# Patient Record
Sex: Female | Born: 1960
Health system: Southern US, Community
[De-identification: ages and names within clinical notes are randomized; demographics above are authoritative.]

## PROBLEM LIST (undated history)

## (undated) DIAGNOSIS — G4700 Insomnia, unspecified: Secondary | ICD-10-CM

## (undated) DIAGNOSIS — D649 Anemia, unspecified: Secondary | ICD-10-CM

## (undated) DIAGNOSIS — I10 Essential (primary) hypertension: Secondary | ICD-10-CM

## (undated) DIAGNOSIS — A599 Trichomoniasis, unspecified: Secondary | ICD-10-CM

## (undated) DIAGNOSIS — K2970 Gastritis, unspecified, without bleeding: Secondary | ICD-10-CM

## (undated) DIAGNOSIS — E785 Hyperlipidemia, unspecified: Secondary | ICD-10-CM

## (undated) DIAGNOSIS — K922 Gastrointestinal hemorrhage, unspecified: Secondary | ICD-10-CM

## (undated) DIAGNOSIS — Z21 Asymptomatic human immunodeficiency virus [HIV] infection status: Secondary | ICD-10-CM

## (undated) DIAGNOSIS — B20 Human immunodeficiency virus [HIV] disease: Secondary | ICD-10-CM

## (undated) DIAGNOSIS — K269 Duodenal ulcer, unspecified as acute or chronic, without hemorrhage or perforation: Secondary | ICD-10-CM

## (undated) DIAGNOSIS — I4891 Unspecified atrial fibrillation: Secondary | ICD-10-CM

## (undated) DIAGNOSIS — T7840XA Allergy, unspecified, initial encounter: Secondary | ICD-10-CM

## (undated) DIAGNOSIS — B379 Candidiasis, unspecified: Secondary | ICD-10-CM

## (undated) HISTORY — DX: Allergy, unspecified, initial encounter: T78.40XA

## (undated) HISTORY — DX: Unspecified atrial fibrillation: I48.91

## (undated) HISTORY — DX: Anemia, unspecified: D64.9

## (undated) HISTORY — DX: Asymptomatic human immunodeficiency virus (hiv) infection status: Z21

## (undated) HISTORY — DX: Duodenal ulcer, unspecified as acute or chronic, without hemorrhage or perforation: K26.9

## (undated) HISTORY — DX: Gastritis, unspecified, without bleeding: K29.70

## (undated) HISTORY — DX: Essential (primary) hypertension: I10

## (undated) HISTORY — DX: Hyperlipidemia, unspecified: E78.5

## (undated) HISTORY — DX: Insomnia, unspecified: G47.00

## (undated) HISTORY — DX: Human immunodeficiency virus (HIV) disease: B20

## (undated) HISTORY — DX: Candidiasis, unspecified: B37.9

## (undated) HISTORY — PX: OTHER SURGICAL HISTORY: SHX169

## (undated) HISTORY — DX: Trichomoniasis, unspecified: A59.9

## (undated) HISTORY — DX: Gastrointestinal hemorrhage, unspecified: K92.2

## (undated) HISTORY — PX: TUBAL LIGATION: SHX77

---

## 1998-04-19 ENCOUNTER — Emergency Department (HOSPITAL_COMMUNITY): Admission: EM | Admit: 1998-04-19 | Discharge: 1998-04-19 | Payer: Self-pay | Admitting: Emergency Medicine

## 2000-10-25 ENCOUNTER — Inpatient Hospital Stay: Admission: AD | Admit: 2000-10-25 | Discharge: 2000-10-25 | Payer: Self-pay | Admitting: Obstetrics

## 2002-05-21 ENCOUNTER — Encounter: Payer: Self-pay | Admitting: Orthopedic Surgery

## 2002-05-21 ENCOUNTER — Emergency Department (HOSPITAL_COMMUNITY): Admission: EM | Admit: 2002-05-21 | Discharge: 2002-05-21 | Payer: Self-pay | Admitting: Emergency Medicine

## 2002-05-21 ENCOUNTER — Encounter: Payer: Self-pay | Admitting: Emergency Medicine

## 2002-05-29 ENCOUNTER — Ambulatory Visit (HOSPITAL_BASED_OUTPATIENT_CLINIC_OR_DEPARTMENT_OTHER): Admission: RE | Admit: 2002-05-29 | Discharge: 2002-05-29 | Payer: Self-pay | Admitting: Orthopedic Surgery

## 2002-06-27 ENCOUNTER — Inpatient Hospital Stay (HOSPITAL_COMMUNITY): Admission: AD | Admit: 2002-06-27 | Discharge: 2002-06-27 | Payer: Self-pay | Admitting: *Deleted

## 2002-08-21 ENCOUNTER — Encounter: Admission: RE | Admit: 2002-08-21 | Discharge: 2002-11-19 | Payer: Self-pay | Admitting: Orthopedic Surgery

## 2003-11-06 ENCOUNTER — Ambulatory Visit (HOSPITAL_COMMUNITY): Admission: RE | Admit: 2003-11-06 | Discharge: 2003-11-06 | Payer: Self-pay | Admitting: Family Medicine

## 2003-11-16 ENCOUNTER — Encounter: Admission: RE | Admit: 2003-11-16 | Discharge: 2003-11-16 | Payer: Self-pay | Admitting: Family Medicine

## 2003-12-13 ENCOUNTER — Ambulatory Visit: Payer: Self-pay | Admitting: *Deleted

## 2004-06-19 ENCOUNTER — Ambulatory Visit: Payer: Self-pay | Admitting: Internal Medicine

## 2004-08-15 ENCOUNTER — Ambulatory Visit: Payer: Self-pay | Admitting: Internal Medicine

## 2004-10-01 ENCOUNTER — Ambulatory Visit: Payer: Self-pay | Admitting: Internal Medicine

## 2005-03-18 ENCOUNTER — Ambulatory Visit: Payer: Self-pay | Admitting: Internal Medicine

## 2005-04-06 ENCOUNTER — Ambulatory Visit: Payer: Self-pay | Admitting: Family Medicine

## 2005-06-11 ENCOUNTER — Ambulatory Visit: Payer: Self-pay | Admitting: Family Medicine

## 2005-08-11 ENCOUNTER — Emergency Department (HOSPITAL_COMMUNITY): Admission: EM | Admit: 2005-08-11 | Discharge: 2005-08-11 | Payer: Self-pay | Admitting: Emergency Medicine

## 2005-09-03 ENCOUNTER — Emergency Department (HOSPITAL_COMMUNITY): Admission: EM | Admit: 2005-09-03 | Discharge: 2005-09-03 | Payer: Self-pay | Admitting: Emergency Medicine

## 2005-12-02 ENCOUNTER — Inpatient Hospital Stay (HOSPITAL_COMMUNITY): Admission: AD | Admit: 2005-12-02 | Discharge: 2005-12-02 | Payer: Self-pay | Admitting: Family Medicine

## 2005-12-14 ENCOUNTER — Ambulatory Visit: Payer: Self-pay | Admitting: Family Medicine

## 2006-02-02 ENCOUNTER — Ambulatory Visit: Payer: Self-pay | Admitting: Family Medicine

## 2006-02-15 ENCOUNTER — Emergency Department (HOSPITAL_COMMUNITY): Admission: EM | Admit: 2006-02-15 | Discharge: 2006-02-15 | Payer: Self-pay | Admitting: Emergency Medicine

## 2006-02-16 ENCOUNTER — Ambulatory Visit: Payer: Self-pay | Admitting: Family Medicine

## 2006-04-27 ENCOUNTER — Ambulatory Visit: Payer: Self-pay | Admitting: Family Medicine

## 2006-06-02 ENCOUNTER — Encounter (INDEPENDENT_AMBULATORY_CARE_PROVIDER_SITE_OTHER): Payer: Self-pay | Admitting: Family Medicine

## 2006-06-02 ENCOUNTER — Ambulatory Visit: Payer: Self-pay | Admitting: Family Medicine

## 2006-07-26 ENCOUNTER — Ambulatory Visit: Payer: Self-pay | Admitting: Family Medicine

## 2006-11-24 ENCOUNTER — Encounter (INDEPENDENT_AMBULATORY_CARE_PROVIDER_SITE_OTHER): Payer: Self-pay | Admitting: *Deleted

## 2006-12-23 ENCOUNTER — Ambulatory Visit: Payer: Self-pay | Admitting: Internal Medicine

## 2006-12-24 ENCOUNTER — Ambulatory Visit (HOSPITAL_COMMUNITY): Admission: RE | Admit: 2006-12-24 | Discharge: 2006-12-24 | Payer: Self-pay | Admitting: Internal Medicine

## 2007-01-18 ENCOUNTER — Ambulatory Visit: Payer: Self-pay | Admitting: Family Medicine

## 2007-05-23 ENCOUNTER — Ambulatory Visit: Payer: Self-pay | Admitting: Family Medicine

## 2007-05-23 ENCOUNTER — Encounter (INDEPENDENT_AMBULATORY_CARE_PROVIDER_SITE_OTHER): Payer: Self-pay | Admitting: Family Medicine

## 2007-05-23 LAB — CONVERTED CEMR LAB
ALT: 19 units/L (ref 0–35)
AST: 23 units/L (ref 0–37)
Albumin: 3.7 g/dL (ref 3.5–5.2)
Alkaline Phosphatase: 85 units/L (ref 39–117)
BUN: 9 mg/dL (ref 6–23)
Basophils Absolute: 0 10*3/uL (ref 0.0–0.1)
Basophils Relative: 1 % (ref 0–1)
CO2: 22 meq/L (ref 19–32)
Calcium: 9 mg/dL (ref 8.4–10.5)
Chlamydia, Swab/Urine, PCR: NEGATIVE
Chloride: 107 meq/L (ref 96–112)
Cholesterol: 149 mg/dL (ref 0–200)
Creatinine, Ser: 0.86 mg/dL (ref 0.40–1.20)
Eosinophils Absolute: 0 10*3/uL (ref 0.0–0.7)
Eosinophils Relative: 0 % (ref 0–5)
GC Probe Amp, Urine: NEGATIVE
Glucose, Bld: 90 mg/dL (ref 70–99)
HCT: 38.1 % (ref 36.0–46.0)
HDL: 43 mg/dL (ref >39)
Helicobacter Pylori Antibody-IgG: 5.2 — ABNORMAL HIGH
Hemoglobin: 12.3 g/dL (ref 12.0–15.0)
LDL Cholesterol: 81 mg/dL (ref 0–99)
Lymphocytes Relative: 44 % (ref 12–46)
Lymphs Abs: 1.9 10*3/uL (ref 0.7–4.0)
MCHC: 32.3 g/dL (ref 30.0–36.0)
MCV: 94.3 fL (ref 78.0–100.0)
Monocytes Absolute: 0.4 10*3/uL (ref 0.1–1.0)
Monocytes Relative: 10 % (ref 3–12)
Neutro Abs: 2 10*3/uL (ref 1.7–7.7)
Neutrophils Relative %: 46 % (ref 43–77)
Platelets: 226 10*3/uL (ref 150–400)
Potassium: 4 meq/L (ref 3.5–5.3)
RBC: 4.04 M/uL (ref 3.87–5.11)
RDW: 14.6 % (ref 11.5–15.5)
Sodium: 139 meq/L (ref 135–145)
TSH: 0.521 microintl units/mL (ref 0.350–5.50)
Total Bilirubin: 0.3 mg/dL (ref 0.3–1.2)
Total CHOL/HDL Ratio: 3.5
Total Protein: 7.5 g/dL (ref 6.0–8.3)
Triglycerides: 124 mg/dL (ref <150)
VLDL: 25 mg/dL (ref 0–40)
WBC: 4.4 10*3/uL (ref 4.0–10.5)

## 2007-06-02 ENCOUNTER — Ambulatory Visit: Payer: Self-pay | Admitting: Internal Medicine

## 2007-06-03 ENCOUNTER — Encounter: Payer: Self-pay | Admitting: Internal Medicine

## 2007-06-03 LAB — CONVERTED CEMR LAB
HCV Ab: NEGATIVE
HIV 1 RNA Quant: 338000 copies/mL — ABNORMAL HIGH (ref <50)
HIV-1 RNA Quant, Log: 5.53 — ABNORMAL HIGH (ref <1.70)
HIV-1 antibody: POSITIVE — AB
HIV-2 Ab: UNDETERMINED — AB
HIV: REACTIVE
Hep B Core Total Ab: NEGATIVE
Hep B S Ab: NEGATIVE
Hepatitis B Surface Ag: NEGATIVE

## 2007-06-09 ENCOUNTER — Ambulatory Visit: Payer: Self-pay | Admitting: Internal Medicine

## 2007-06-10 ENCOUNTER — Ambulatory Visit: Payer: Self-pay | Admitting: Internal Medicine

## 2007-06-13 ENCOUNTER — Ambulatory Visit: Payer: Self-pay | Admitting: Family Medicine

## 2007-06-16 ENCOUNTER — Ambulatory Visit: Payer: Self-pay | Admitting: Internal Medicine

## 2007-06-16 ENCOUNTER — Encounter: Payer: Self-pay | Admitting: Internal Medicine

## 2007-06-16 LAB — CONVERTED CEMR LAB
Absolute CD4: 462 #/uL (ref 381–1469)
CD4 T Helper %: 40 % (ref 32–62)
Total Lymphocytes %: 35 % (ref 12–46)
Total lymphocyte count: 1155 cells/mcL (ref 700–3300)
WBC, lymph enumeration: 3.3 10*3/uL — ABNORMAL LOW (ref 4.0–10.5)

## 2007-06-30 ENCOUNTER — Ambulatory Visit: Payer: Self-pay | Admitting: Internal Medicine

## 2007-08-10 ENCOUNTER — Emergency Department (HOSPITAL_COMMUNITY): Admission: EM | Admit: 2007-08-10 | Discharge: 2007-08-10 | Payer: Self-pay | Admitting: Emergency Medicine

## 2007-09-01 ENCOUNTER — Ambulatory Visit: Payer: Self-pay | Admitting: Internal Medicine

## 2007-09-02 ENCOUNTER — Encounter: Payer: Self-pay | Admitting: Internal Medicine

## 2007-09-02 LAB — CONVERTED CEMR LAB
ALT: 73 units/L — ABNORMAL HIGH (ref 0–35)
AST: 54 units/L — ABNORMAL HIGH (ref 0–37)
Absolute CD4: 326 #/uL — ABNORMAL LOW (ref 381–1469)
Albumin: 3.9 g/dL (ref 3.5–5.2)
Alkaline Phosphatase: 108 units/L (ref 39–117)
BUN: 14 mg/dL (ref 6–23)
Basophils Absolute: 0 10*3/uL (ref 0.0–0.1)
Basophils Relative: 0 % (ref 0–1)
CD4 T Helper %: 31 % — ABNORMAL LOW (ref 32–62)
CO2: 24 meq/L (ref 19–32)
Calcium: 8.9 mg/dL (ref 8.4–10.5)
Chloride: 104 meq/L (ref 96–112)
Creatinine, Ser: 1.04 mg/dL (ref 0.40–1.20)
Eosinophils Absolute: 0 10*3/uL (ref 0.0–0.7)
Eosinophils Relative: 1 % (ref 0–5)
Glucose, Bld: 58 mg/dL — ABNORMAL LOW (ref 70–99)
HCT: 44.2 % (ref 36.0–46.0)
HIV 1 RNA Quant: 1300000 copies/mL — ABNORMAL HIGH (ref <50)
HIV-1 RNA Quant, Log: 6.11 — ABNORMAL HIGH (ref <1.70)
Hemoglobin: 14.2 g/dL (ref 12.0–15.0)
Lymphocytes Relative: 30 % (ref 12–46)
Lymphs Abs: 1.1 10*3/uL (ref 0.7–4.0)
MCHC: 32.1 g/dL (ref 30.0–36.0)
MCV: 89.8 fL (ref 78.0–100.0)
Monocytes Absolute: 0.1 10*3/uL (ref 0.1–1.0)
Monocytes Relative: 4 % (ref 3–12)
Neutro Abs: 2.2 10*3/uL (ref 1.7–7.7)
Neutrophils Relative %: 64 % (ref 43–77)
Platelets: 222 10*3/uL (ref 150–400)
Potassium: 4.9 meq/L (ref 3.5–5.3)
RBC: 4.92 M/uL (ref 3.87–5.11)
RDW: 14.1 % (ref 11.5–15.5)
Sodium: 139 meq/L (ref 135–145)
Total Bilirubin: 0.3 mg/dL (ref 0.3–1.2)
Total Lymphocytes %: 30 % (ref 12–46)
Total Protein: 8.5 g/dL — ABNORMAL HIGH (ref 6.0–8.3)
Total lymphocyte count: 1050 cells/mcL (ref 700–3300)
WBC, lymph enumeration: 3.5 10*3/uL — ABNORMAL LOW (ref 4.0–10.5)
WBC: 3.5 10*3/uL — ABNORMAL LOW (ref 4.0–10.5)

## 2007-09-15 ENCOUNTER — Ambulatory Visit: Payer: Self-pay | Admitting: Internal Medicine

## 2007-10-20 ENCOUNTER — Ambulatory Visit: Payer: Self-pay | Admitting: Internal Medicine

## 2007-11-21 ENCOUNTER — Ambulatory Visit: Payer: Self-pay | Admitting: Internal Medicine

## 2007-11-21 LAB — CONVERTED CEMR LAB
ALT: 125 units/L — ABNORMAL HIGH (ref 0–35)
AST: 70 units/L — ABNORMAL HIGH (ref 0–37)
Absolute CD4: 430 #/uL (ref 381–1469)
Albumin: 3.9 g/dL (ref 3.5–5.2)
Alkaline Phosphatase: 210 units/L — ABNORMAL HIGH (ref 39–117)
BUN: 12 mg/dL (ref 6–23)
Basophils Absolute: 0 10*3/uL (ref 0.0–0.1)
Basophils Relative: 0 % (ref 0–1)
CD4 T Helper %: 39 % (ref 32–62)
CO2: 25 meq/L (ref 19–32)
Calcium: 9.4 mg/dL (ref 8.4–10.5)
Chloride: 102 meq/L (ref 96–112)
Creatinine, Ser: 0.82 mg/dL (ref 0.40–1.20)
Eosinophils Absolute: 0.1 10*3/uL (ref 0.0–0.7)
Eosinophils Relative: 2 % (ref 0–5)
Glucose, Bld: 95 mg/dL (ref 70–99)
HCT: 34.2 % — ABNORMAL LOW (ref 36.0–46.0)
HIV 1 RNA Quant: 501 copies/mL — ABNORMAL HIGH (ref <50)
HIV-1 RNA Quant, Log: 2.7 — ABNORMAL HIGH (ref <1.70)
Hemoglobin: 10.9 g/dL — ABNORMAL LOW (ref 12.0–15.0)
Lymphocytes Relative: 29 % (ref 12–46)
Lymphs Abs: 1.1 10*3/uL (ref 0.7–4.0)
MCHC: 31.9 g/dL (ref 30.0–36.0)
MCV: 91.9 fL (ref 78.0–100.0)
Monocytes Absolute: 0.4 10*3/uL (ref 0.1–1.0)
Monocytes Relative: 11 % (ref 3–12)
Neutro Abs: 2.2 10*3/uL (ref 1.7–7.7)
Neutrophils Relative %: 58 % (ref 43–77)
Platelets: 240 10*3/uL (ref 150–400)
Potassium: 4.1 meq/L (ref 3.5–5.3)
RBC: 3.72 M/uL — ABNORMAL LOW (ref 3.87–5.11)
RDW: 15.9 % — ABNORMAL HIGH (ref 11.5–15.5)
Sodium: 135 meq/L (ref 135–145)
Total Bilirubin: 0.3 mg/dL (ref 0.3–1.2)
Total Lymphocytes %: 29 % (ref 12–46)
Total Protein: 8.8 g/dL — ABNORMAL HIGH (ref 6.0–8.3)
Total lymphocyte count: 1102 cells/mcL (ref 700–3300)
WBC, lymph enumeration: 3.8 10*3/uL — ABNORMAL LOW (ref 4.0–10.5)
WBC: 3.8 10*3/uL — ABNORMAL LOW (ref 4.0–10.5)

## 2007-11-30 ENCOUNTER — Ambulatory Visit: Payer: Self-pay | Admitting: Internal Medicine

## 2007-12-05 ENCOUNTER — Ambulatory Visit: Payer: Self-pay | Admitting: Internal Medicine

## 2007-12-08 ENCOUNTER — Ambulatory Visit (HOSPITAL_COMMUNITY): Admission: RE | Admit: 2007-12-08 | Discharge: 2007-12-08 | Payer: Self-pay | Admitting: Internal Medicine

## 2008-01-04 ENCOUNTER — Ambulatory Visit: Payer: Self-pay | Admitting: Internal Medicine

## 2008-01-04 ENCOUNTER — Encounter (INDEPENDENT_AMBULATORY_CARE_PROVIDER_SITE_OTHER): Payer: Self-pay | Admitting: Family Medicine

## 2008-01-04 LAB — CONVERTED CEMR LAB
Chlamydia, DNA Probe: NEGATIVE
GC Probe Amp, Genital: NEGATIVE

## 2008-01-13 ENCOUNTER — Encounter: Payer: Self-pay | Admitting: Internal Medicine

## 2008-01-13 LAB — CONVERTED CEMR LAB

## 2008-01-19 DIAGNOSIS — B20 Human immunodeficiency virus [HIV] disease: Secondary | ICD-10-CM | POA: Insufficient documentation

## 2008-01-19 DIAGNOSIS — G609 Hereditary and idiopathic neuropathy, unspecified: Secondary | ICD-10-CM | POA: Insufficient documentation

## 2008-01-19 DIAGNOSIS — I10 Essential (primary) hypertension: Secondary | ICD-10-CM | POA: Insufficient documentation

## 2008-01-21 ENCOUNTER — Emergency Department (HOSPITAL_COMMUNITY): Admission: EM | Admit: 2008-01-21 | Discharge: 2008-01-21 | Payer: Self-pay | Admitting: Emergency Medicine

## 2008-01-26 ENCOUNTER — Ambulatory Visit: Payer: Self-pay | Admitting: Internal Medicine

## 2008-01-31 ENCOUNTER — Ambulatory Visit: Payer: Self-pay | Admitting: Internal Medicine

## 2008-03-05 ENCOUNTER — Ambulatory Visit: Payer: Self-pay | Admitting: Internal Medicine

## 2008-03-05 LAB — CONVERTED CEMR LAB
ALT: 33 units/L (ref 0–35)
AST: 31 units/L (ref 0–37)
Absolute CD4: 562 #/uL (ref 381–1469)
Albumin: 3.8 g/dL (ref 3.5–5.2)
Alkaline Phosphatase: 158 units/L — ABNORMAL HIGH (ref 39–117)
BUN: 10 mg/dL (ref 6–23)
Basophils Absolute: 0 10*3/uL (ref 0.0–0.1)
Basophils Relative: 0 % (ref 0–1)
CD4 T Helper %: 46 % (ref 32–62)
CO2: 26 meq/L (ref 19–32)
Calcium: 9.2 mg/dL (ref 8.4–10.5)
Chloride: 102 meq/L (ref 96–112)
Creatinine, Ser: 0.89 mg/dL (ref 0.40–1.20)
Eosinophils Absolute: 0.1 10*3/uL (ref 0.0–0.7)
Eosinophils Relative: 2 % (ref 0–5)
Glucose, Bld: 86 mg/dL (ref 70–99)
HCT: 36.8 % (ref 36.0–46.0)
HIV 1 RNA Quant: <48 copies/mL (ref <48)
HIV-1 RNA Quant, Log: <1.68 (ref <1.68)
Hemoglobin: 11.9 g/dL — ABNORMAL LOW (ref 12.0–15.0)
Lymphocytes Relative: 33 % (ref 12–46)
Lymphs Abs: 1.2 10*3/uL (ref 0.7–4.0)
MCHC: 32.3 g/dL (ref 30.0–36.0)
MCV: 94.4 fL (ref 78.0–100.0)
Monocytes Absolute: 0.2 10*3/uL (ref 0.1–1.0)
Monocytes Relative: 6 % (ref 3–12)
Neutro Abs: 2.2 10*3/uL (ref 1.7–7.7)
Neutrophils Relative %: 59 % (ref 43–77)
Platelets: 322 10*3/uL (ref 150–400)
Potassium: 4.2 meq/L (ref 3.5–5.3)
RBC: 3.9 M/uL (ref 3.87–5.11)
RDW: 13.1 % (ref 11.5–15.5)
Sodium: 139 meq/L (ref 135–145)
Total Bilirubin: 0.3 mg/dL (ref 0.3–1.2)
Total Lymphocytes %: 33 % (ref 12–46)
Total Protein: 8.5 g/dL — ABNORMAL HIGH (ref 6.0–8.3)
Total lymphocyte count: 1221 cells/mcL (ref 700–3300)
WBC, lymph enumeration: 3.7 10*3/uL — ABNORMAL LOW (ref 4.0–10.5)
WBC: 3.7 10*3/uL — ABNORMAL LOW (ref 4.0–10.5)

## 2008-03-26 ENCOUNTER — Ambulatory Visit: Payer: Self-pay | Admitting: Internal Medicine

## 2008-05-18 ENCOUNTER — Telehealth: Payer: Self-pay | Admitting: Internal Medicine

## 2008-06-18 ENCOUNTER — Encounter: Payer: Self-pay | Admitting: Internal Medicine

## 2008-06-18 ENCOUNTER — Ambulatory Visit: Payer: Self-pay | Admitting: Internal Medicine

## 2008-06-18 DIAGNOSIS — M25569 Pain in unspecified knee: Secondary | ICD-10-CM | POA: Insufficient documentation

## 2008-06-18 LAB — CONVERTED CEMR LAB
ALT: 27 units/L (ref 0–35)
AST: 31 units/L (ref 0–37)
Absolute CD4: 798 #/uL (ref 381–1469)
Albumin: 4 g/dL (ref 3.5–5.2)
Alkaline Phosphatase: 159 units/L — ABNORMAL HIGH (ref 39–117)
BUN: 15 mg/dL (ref 6–23)
Basophils Absolute: 0 10*3/uL (ref 0.0–0.1)
Basophils Relative: 0 % (ref 0–1)
CD4 Count: 798 microliters
CD4 T Helper %: 49 % (ref 32–62)
CO2: 24 meq/L (ref 19–32)
Calcium: 9.4 mg/dL (ref 8.4–10.5)
Chloride: 102 meq/L (ref 96–112)
Creatinine, Ser: 0.99 mg/dL (ref 0.40–1.20)
Eosinophils Absolute: 0.1 10*3/uL (ref 0.0–0.7)
Eosinophils Relative: 2 % (ref 0–5)
Glucose, Bld: 83 mg/dL (ref 70–99)
HCT: 32.5 % — ABNORMAL LOW (ref 36.0–46.0)
HIV 1 RNA Quant: <48 copies/mL (ref <48)
HIV-1 RNA Quant, Log: <1.68 (ref <1.68)
Hemoglobin: 10.9 g/dL — ABNORMAL LOW (ref 12.0–15.0)
Lymphocytes Relative: 37 % (ref 12–46)
Lymphs Abs: 1.6 10*3/uL (ref 0.7–4.0)
MCHC: 33.5 g/dL (ref 30.0–36.0)
MCV: 90.8 fL (ref 78.0–100.0)
Monocytes Absolute: 0.2 10*3/uL (ref 0.1–1.0)
Monocytes Relative: 5 % (ref 3–12)
Neutro Abs: 2.5 10*3/uL (ref 1.7–7.7)
Neutrophils Relative %: 56 % (ref 43–77)
Platelets: 307 10*3/uL (ref 150–400)
Potassium: 3.7 meq/L (ref 3.5–5.3)
RBC: 3.58 M/uL — ABNORMAL LOW (ref 3.87–5.11)
RDW: 13.3 % (ref 11.5–15.5)
Sodium: 138 meq/L (ref 135–145)
Total Bilirubin: 0.2 mg/dL — ABNORMAL LOW (ref 0.3–1.2)
Total Lymphocytes %: 37 % (ref 12–46)
Total Protein: 8.2 g/dL (ref 6.0–8.3)
Total lymphocyte count: 1628 cells/mcL (ref 700–3300)
WBC, lymph enumeration: 4.4 10*3/uL (ref 4.0–10.5)
WBC: 4.4 10*3/uL (ref 4.0–10.5)

## 2008-06-28 ENCOUNTER — Encounter: Payer: Self-pay | Admitting: Internal Medicine

## 2008-07-02 ENCOUNTER — Encounter: Payer: Self-pay | Admitting: Internal Medicine

## 2008-07-02 ENCOUNTER — Ambulatory Visit: Payer: Self-pay | Admitting: Family Medicine

## 2008-07-02 DIAGNOSIS — R109 Unspecified abdominal pain: Secondary | ICD-10-CM | POA: Insufficient documentation

## 2008-07-02 DIAGNOSIS — R1030 Lower abdominal pain, unspecified: Secondary | ICD-10-CM | POA: Insufficient documentation

## 2008-09-06 ENCOUNTER — Encounter: Payer: Self-pay | Admitting: Internal Medicine

## 2008-09-06 ENCOUNTER — Ambulatory Visit: Payer: Self-pay | Admitting: Internal Medicine

## 2008-09-20 ENCOUNTER — Telehealth: Payer: Self-pay | Admitting: Internal Medicine

## 2008-10-25 ENCOUNTER — Encounter: Payer: Self-pay | Admitting: Internal Medicine

## 2008-10-25 ENCOUNTER — Ambulatory Visit: Payer: Self-pay | Admitting: Family Medicine

## 2008-10-25 DIAGNOSIS — R350 Frequency of micturition: Secondary | ICD-10-CM | POA: Insufficient documentation

## 2008-10-25 LAB — CONVERTED CEMR LAB
Bilirubin Urine: NEGATIVE
Blood in Urine, dipstick: NEGATIVE
Glucose, Urine, Semiquant: NEGATIVE
Ketones, urine, test strip: NEGATIVE
Nitrite: NEGATIVE
Protein, U semiquant: NEGATIVE
Specific Gravity, Urine: 1.02
Urobilinogen, UA: 0.2
WBC Urine, dipstick: NEGATIVE
pH: 5.5

## 2008-11-08 ENCOUNTER — Encounter: Payer: Self-pay | Admitting: Internal Medicine

## 2008-11-08 ENCOUNTER — Ambulatory Visit: Payer: Self-pay | Admitting: Internal Medicine

## 2008-11-08 DIAGNOSIS — J029 Acute pharyngitis, unspecified: Secondary | ICD-10-CM | POA: Insufficient documentation

## 2008-11-14 ENCOUNTER — Encounter: Payer: Self-pay | Admitting: Internal Medicine

## 2008-11-14 LAB — CONVERTED CEMR LAB
Bilirubin Urine: NEGATIVE
Blood in Urine, dipstick: NEGATIVE
Ketones, urine, test strip: NEGATIVE
Nitrite: NEGATIVE
Protein, U semiquant: NEGATIVE

## 2008-12-07 ENCOUNTER — Encounter: Payer: Self-pay | Admitting: Internal Medicine

## 2008-12-17 ENCOUNTER — Ambulatory Visit: Payer: Self-pay | Admitting: Internal Medicine

## 2008-12-17 ENCOUNTER — Encounter: Payer: Self-pay | Admitting: Internal Medicine

## 2008-12-17 LAB — CONVERTED CEMR LAB
Albumin: 3.6 g/dL (ref 3.5–5.2)
Alkaline Phosphatase: 211 units/L — ABNORMAL HIGH (ref 39–117)
BUN: 14 mg/dL (ref 6–23)
Basophils Absolute: 0 10*3/uL (ref 0.0–0.1)
Basophils Relative: 0 % (ref 0–1)
CD4 T Helper %: 54 % (ref 32–62)
CO2: 21 meq/L (ref 19–32)
Cholesterol: 170 mg/dL (ref 0–200)
Eosinophils Relative: 2 % (ref 0–5)
Glucose, Bld: 82 mg/dL (ref 70–99)
HCT: 34.9 % — ABNORMAL LOW (ref 36.0–46.0)
HDL: 53 mg/dL (ref >39)
Hemoglobin: 11.2 g/dL — ABNORMAL LOW (ref 12.0–15.0)
MCHC: 32.1 g/dL (ref 30.0–36.0)
Monocytes Absolute: 0.2 10*3/uL (ref 0.1–1.0)
Monocytes Relative: 6 % (ref 3–12)
Neutro Abs: 1.2 10*3/uL — ABNORMAL LOW (ref 1.7–7.7)
Potassium: 3.7 meq/L (ref 3.5–5.3)
RBC: 3.65 M/uL — ABNORMAL LOW (ref 3.87–5.11)
RDW: 13.4 % (ref 11.5–15.5)
Sodium: 137 meq/L (ref 135–145)
Total Bilirubin: 0.2 mg/dL — ABNORMAL LOW (ref 0.3–1.2)
Total Protein: 7.1 g/dL (ref 6.0–8.3)
Total lymphocyte count: 1296 cells/mcL (ref 700–3300)
Triglycerides: 68 mg/dL (ref <150)

## 2008-12-31 ENCOUNTER — Ambulatory Visit: Payer: Self-pay | Admitting: Internal Medicine

## 2008-12-31 ENCOUNTER — Encounter: Payer: Self-pay | Admitting: Internal Medicine

## 2009-01-01 ENCOUNTER — Encounter: Payer: Self-pay | Admitting: Internal Medicine

## 2009-01-01 ENCOUNTER — Ambulatory Visit: Payer: Self-pay | Admitting: Internal Medicine

## 2009-01-02 ENCOUNTER — Encounter: Payer: Self-pay | Admitting: Internal Medicine

## 2009-01-08 ENCOUNTER — Ambulatory Visit (HOSPITAL_COMMUNITY): Admission: RE | Admit: 2009-01-08 | Discharge: 2009-01-08 | Payer: Self-pay | Admitting: Internal Medicine

## 2009-01-17 ENCOUNTER — Ambulatory Visit: Payer: Self-pay | Admitting: Internal Medicine

## 2009-01-17 ENCOUNTER — Encounter: Payer: Self-pay | Admitting: Internal Medicine

## 2009-01-17 LAB — CONVERTED CEMR LAB
Iron: 58 ug/dL (ref 42–145)
TIBC: 322 ug/dL (ref 250–470)
UIBC: 264 ug/dL

## 2009-01-24 ENCOUNTER — Telehealth: Payer: Self-pay | Admitting: Internal Medicine

## 2009-02-12 ENCOUNTER — Ambulatory Visit: Payer: Self-pay | Admitting: Internal Medicine

## 2009-02-12 LAB — CONVERTED CEMR LAB
ALT: 68 units/L — ABNORMAL HIGH (ref 0–35)
Alkaline Phosphatase: 219 units/L — ABNORMAL HIGH (ref 39–117)
Basophils Absolute: 0 10*3/uL (ref 0.0–0.1)
Basophils Relative: 0 % (ref 0–1)
CO2: 25 meq/L (ref 19–32)
Cholesterol: 191 mg/dL (ref 0–200)
Creatinine, Ser: 0.87 mg/dL (ref 0.40–1.20)
Eosinophils Absolute: 0.1 10*3/uL (ref 0.0–0.7)
HIV-1 RNA Quant, Log: <1.68 (ref <1.68)
LDL Cholesterol: 111 mg/dL — ABNORMAL HIGH (ref 0–99)
MCHC: 32.5 g/dL (ref 30.0–36.0)
MCV: 93.6 fL (ref 78.0–100.0)
Monocytes Relative: 5 % (ref 3–12)
Neutro Abs: 1.7 10*3/uL (ref 1.7–7.7)
Neutrophils Relative %: 46 % (ref 43–77)
Platelets: 270 10*3/uL (ref 150–400)
RDW: 13.2 % (ref 11.5–15.5)
Total Bilirubin: 0.2 mg/dL — ABNORMAL LOW (ref 0.3–1.2)
Total CHOL/HDL Ratio: 3
VLDL: 16 mg/dL (ref 0–40)

## 2009-04-15 ENCOUNTER — Ambulatory Visit: Payer: Self-pay | Admitting: Internal Medicine

## 2009-04-18 ENCOUNTER — Encounter: Payer: Self-pay | Admitting: Internal Medicine

## 2009-04-18 ENCOUNTER — Ambulatory Visit: Payer: Self-pay | Admitting: Internal Medicine

## 2009-04-23 ENCOUNTER — Telehealth (INDEPENDENT_AMBULATORY_CARE_PROVIDER_SITE_OTHER): Payer: Self-pay | Admitting: *Deleted

## 2009-05-23 ENCOUNTER — Telehealth: Payer: Self-pay | Admitting: Internal Medicine

## 2009-06-16 ENCOUNTER — Emergency Department (HOSPITAL_COMMUNITY): Admission: EM | Admit: 2009-06-16 | Discharge: 2009-06-16 | Payer: Self-pay | Admitting: Emergency Medicine

## 2009-06-18 ENCOUNTER — Ambulatory Visit: Payer: Self-pay | Admitting: Internal Medicine

## 2009-06-18 LAB — CONVERTED CEMR LAB
BUN: 12 mg/dL (ref 6–23)
CO2: 25 meq/L (ref 19–32)
Calcium: 8.8 mg/dL (ref 8.4–10.5)
Chloride: 105 meq/L (ref 96–112)
Creatinine, Ser: 0.81 mg/dL (ref 0.40–1.20)
Eosinophils Absolute: 0.1 10*3/uL (ref 0.0–0.7)
Eosinophils Relative: 2 % (ref 0–5)
HCT: 33.2 % — ABNORMAL LOW (ref 36.0–46.0)
HIV 1 RNA Quant: <48 copies/mL (ref <48)
HIV-1 RNA Quant, Log: <1.68 (ref <1.68)
Lymphs Abs: 1.4 10*3/uL (ref 0.7–4.0)
MCV: 92.5 fL (ref 78.0–100.0)
Monocytes Relative: 7 % (ref 3–12)
Platelets: 249 10*3/uL (ref 150–400)
RBC: 3.59 M/uL — ABNORMAL LOW (ref 3.87–5.11)
WBC: 3.1 10*3/uL — ABNORMAL LOW (ref 4.0–10.5)

## 2009-06-19 ENCOUNTER — Encounter: Payer: Self-pay | Admitting: Internal Medicine

## 2009-06-20 ENCOUNTER — Telehealth (INDEPENDENT_AMBULATORY_CARE_PROVIDER_SITE_OTHER): Payer: Self-pay | Admitting: *Deleted

## 2009-07-03 ENCOUNTER — Ambulatory Visit: Payer: Self-pay | Admitting: Internal Medicine

## 2009-07-03 ENCOUNTER — Telehealth: Payer: Self-pay | Admitting: Internal Medicine

## 2009-07-03 ENCOUNTER — Encounter (INDEPENDENT_AMBULATORY_CARE_PROVIDER_SITE_OTHER): Payer: Self-pay | Admitting: *Deleted

## 2009-07-03 DIAGNOSIS — N898 Other specified noninflammatory disorders of vagina: Secondary | ICD-10-CM | POA: Insufficient documentation

## 2009-07-05 LAB — CONVERTED CEMR LAB
Candida species: NEGATIVE
Gardnerella vaginalis: POSITIVE — AB

## 2009-07-11 ENCOUNTER — Ambulatory Visit: Payer: Self-pay | Admitting: Internal Medicine

## 2009-07-19 ENCOUNTER — Telehealth: Payer: Self-pay | Admitting: Internal Medicine

## 2009-07-24 ENCOUNTER — Telehealth: Payer: Self-pay | Admitting: Internal Medicine

## 2009-08-22 ENCOUNTER — Telehealth (INDEPENDENT_AMBULATORY_CARE_PROVIDER_SITE_OTHER): Payer: Self-pay | Admitting: *Deleted

## 2009-08-27 ENCOUNTER — Telehealth: Payer: Self-pay | Admitting: Internal Medicine

## 2009-10-02 ENCOUNTER — Encounter: Payer: Self-pay | Admitting: Internal Medicine

## 2009-10-22 ENCOUNTER — Ambulatory Visit: Payer: Self-pay | Admitting: Infectious Disease

## 2009-10-22 ENCOUNTER — Encounter: Payer: Self-pay | Admitting: Internal Medicine

## 2009-10-22 LAB — CONVERTED CEMR LAB
AST: 43 units/L — ABNORMAL HIGH (ref 0–37)
Albumin: 4 g/dL (ref 3.5–5.2)
Alkaline Phosphatase: 190 units/L — ABNORMAL HIGH (ref 39–117)
Basophils Absolute: 0 10*3/uL (ref 0.0–0.1)
CO2: 21 meq/L (ref 19–32)
Calcium: 9.3 mg/dL (ref 8.4–10.5)
Eosinophils Relative: 2 % (ref 0–5)
HIV 1 RNA Quant: <48 copies/mL (ref <48)
HIV-1 RNA Quant, Log: <1.68 (ref <1.68)
Lymphocytes Relative: 51 % — ABNORMAL HIGH (ref 12–46)
Neutro Abs: 1.6 10*3/uL — ABNORMAL LOW (ref 1.7–7.7)
Platelets: 240 10*3/uL (ref 150–400)
RDW: 13.3 % (ref 11.5–15.5)
Sodium: 141 meq/L (ref 135–145)

## 2009-10-24 ENCOUNTER — Telehealth (INDEPENDENT_AMBULATORY_CARE_PROVIDER_SITE_OTHER): Payer: Self-pay | Admitting: *Deleted

## 2009-11-21 ENCOUNTER — Ambulatory Visit: Payer: Self-pay | Admitting: Internal Medicine

## 2009-11-26 ENCOUNTER — Telehealth: Payer: Self-pay | Admitting: Internal Medicine

## 2009-11-29 ENCOUNTER — Encounter: Payer: Self-pay | Admitting: Infectious Disease

## 2009-12-27 ENCOUNTER — Telehealth (INDEPENDENT_AMBULATORY_CARE_PROVIDER_SITE_OTHER): Payer: Self-pay | Admitting: Licensed Clinical Social Worker

## 2010-01-22 ENCOUNTER — Telehealth (INDEPENDENT_AMBULATORY_CARE_PROVIDER_SITE_OTHER): Payer: Self-pay | Admitting: *Deleted

## 2010-02-17 ENCOUNTER — Telehealth: Payer: Self-pay | Admitting: Internal Medicine

## 2010-02-19 ENCOUNTER — Ambulatory Visit: Payer: Self-pay | Admitting: Internal Medicine

## 2010-02-19 ENCOUNTER — Encounter: Payer: Self-pay | Admitting: Internal Medicine

## 2010-02-19 LAB — CONVERTED CEMR LAB
ALT: 115 units/L — ABNORMAL HIGH (ref 0–35)
CO2: 27 meq/L (ref 19–32)
Calcium: 9.1 mg/dL (ref 8.4–10.5)
Chloride: 105 meq/L (ref 96–112)
Creatinine, Ser: 0.75 mg/dL (ref 0.40–1.20)
Glucose, Bld: 78 mg/dL (ref 70–99)
HCT: 33.7 % — ABNORMAL LOW (ref 36.0–46.0)
Lymphocytes Relative: 40 % (ref 12–46)
Lymphs Abs: 1.4 10*3/uL (ref 0.7–4.0)
Neutrophils Relative %: 54 % (ref 43–77)
Platelets: 264 10*3/uL (ref 150–400)
RBC: 3.49 M/uL — ABNORMAL LOW (ref 3.87–5.11)
Total Protein: 7.4 g/dL (ref 6.0–8.3)
WBC: 3.5 10*3/uL — ABNORMAL LOW (ref 4.0–10.5)

## 2010-03-20 ENCOUNTER — Ambulatory Visit
Admission: RE | Admit: 2010-03-20 | Discharge: 2010-03-20 | Payer: Self-pay | Source: Home / Self Care | Attending: Internal Medicine | Admitting: Internal Medicine

## 2010-03-20 ENCOUNTER — Encounter: Payer: Self-pay | Admitting: Internal Medicine

## 2010-03-20 DIAGNOSIS — Z789 Other specified health status: Secondary | ICD-10-CM | POA: Insufficient documentation

## 2010-03-20 LAB — CONVERTED CEMR LAB
Alkaline Phosphatase: 241 units/L — ABNORMAL HIGH (ref 39–117)
BUN: 11 mg/dL (ref 6–23)
Basophils Relative: 0 % (ref 0–1)
CO2: 23 meq/L (ref 19–32)
Creatinine, Ser: 0.92 mg/dL (ref 0.40–1.20)
Eosinophils Absolute: 0 10*3/uL (ref 0.0–0.7)
Glucose, Bld: 103 mg/dL — ABNORMAL HIGH (ref 70–99)
HCV Ab: NEGATIVE
HDL: 76 mg/dL (ref >39)
HIV 1 RNA Quant: <20 copies/mL (ref <20)
Hemoglobin: 12.1 g/dL (ref 12.0–15.0)
Hepatitis B Surface Ag: NEGATIVE
LDL Cholesterol: 106 mg/dL — ABNORMAL HIGH (ref 0–99)
Lymphs Abs: 1.7 10*3/uL (ref 0.7–4.0)
MCHC: 33.2 g/dL (ref 30.0–36.0)
MCV: 95.3 fL (ref 78.0–100.0)
Monocytes Absolute: 0.2 10*3/uL (ref 0.1–1.0)
Monocytes Relative: 6 % (ref 3–12)
Neutrophils Relative %: 49 % (ref 43–77)
RBC: 3.83 M/uL — ABNORMAL LOW (ref 3.87–5.11)
Sodium: 136 meq/L (ref 135–145)
Total Bilirubin: 0.3 mg/dL (ref 0.3–1.2)
Triglycerides: 165 mg/dL — ABNORMAL HIGH (ref <150)
VLDL: 33 mg/dL (ref 0–40)
WBC: 3.8 10*3/uL — ABNORMAL LOW (ref 4.0–10.5)

## 2010-03-24 ENCOUNTER — Telehealth (INDEPENDENT_AMBULATORY_CARE_PROVIDER_SITE_OTHER): Payer: Self-pay | Admitting: *Deleted

## 2010-03-24 ENCOUNTER — Telehealth: Payer: Self-pay | Admitting: Internal Medicine

## 2010-03-24 LAB — T-HELPER CELL (CD4) - (RCID CLINIC ONLY)
CD4 % Helper T Cell: 54 % (ref 33–55)
CD4 T Cell Abs: 940 uL (ref 400–2700)

## 2010-03-30 ENCOUNTER — Encounter: Payer: Self-pay | Admitting: Internal Medicine

## 2010-04-10 NOTE — Assessment & Plan Note (Signed)
Summary: F/U OV/VS   CC:  follow-up visit, lab results, and c/o off and on bilateral leg pain.  History of Present Illness: patient here for follow-up on her lab results.  She has been taking her atripls every day.  She complains of some achiness in her lower extremities.  She has tried ibuprofen in the past that is alleviated most of the pain.  Preventive Screening-Counseling & Management  Alcohol-Tobacco     Alcohol drinks/day: occasional     Smoking Status: never  Caffeine-Diet-Exercise     Caffeine use/day: 2     Does Patient Exercise: yes     Type of exercise: walks     Exercise (avg: min/session): >60     Times/week: 7  Safety-Violence-Falls     Seat Belt Use: yes      Sexual History:  currently monogamous.        Drug Use:  never.        Blood Transfusions:  no.    Comments: pt. given condoms   Updated Prior Medication List: ATRIPLA 600-200-300 MG TABS (EFAVIRENZ-EMTRICITAB-TENOFOVIR) Take 1 tablet by mouth at bedtime NEURONTIN 300 MG CAPS (GABAPENTIN) Take 1 tablet by mouth three times a day IBUPROFEN 800 MG TABS (IBUPROFEN) Take 1 tablet by mouth every 8 hours PRILOSEC 20 MG CPDR (OMEPRAZOLE) Take 1 tablet by mouth once a day LISINOPRIL-HYDROCHLOROTHIAZIDE 10-12.5 MG TABS (LISINOPRIL-HYDROCHLOROTHIAZIDE) Take 1 tablet by mouth once a day  Current Allergies (reviewed today): No known allergies  Past History:  Past Medical History: Last updated: 01/19/2008 HIV disease Hypertension  Review of Systems  The patient denies anorexia, fever, weight loss, and chest pain.    Vital Signs:  Patient profile:   50 year old female Menstrual status:  postmenopausal Height:      67 inches (170.18 cm) Weight:      161.12 pounds (73.24 kg) BMI:     25.33 Temp:     98.4 degrees F (36.89 degrees C) oral Pulse rate:   85 / minute BP sitting:   112 / 75  (left arm)  Vitals Entered By: Wendall Mola CMA Duncan Dull) (March 20, 2010 3:02 PM) CC: follow-up visit,  lab results, c/o off and on bilateral leg pain Is Patient Diabetic? No Pain Assessment Patient in pain? no      Nutritional Status BMI of 25 - 29 = overweight Nutritional Status Detail appetite "good"  Have you ever been in a relationship where you felt threatened, hurt or afraid?No   Does patient need assistance? Functional Status Self care Ambulation Normal Comments no missed doses of meds per pt.   Physical Exam  General:  alert, well-developed, well-nourished, and well-hydrated.   Head:  normocephalic and atraumatic.   Mouth:  pharynx pink and moist.  no thrush  Lungs:  normal breath sounds.     Impression & Recommendations:  Problem # 1:  HIV DISEASE (ICD-042) Pt.s most recent CD4ct was 770 and VL <20 .  Pt instructed to continue the current antiretroviral regimen.  Pt encouraged to take medication regularly and not miss doees.  Pt will f/u in 3 months for repeat blood work and will see me 2 weeks later.  Diagnostics Reviewed:  HIV: HIV positive - not AIDS (07/11/2009)   HIV-Western blot: Positive (06/03/2007)   CD4: 770 (02/20/2010)   WBC: 3.5 (02/19/2010)   Hgb: 11.0 (02/19/2010)   HCT: 33.7 (02/19/2010)   Platelets: 264 (02/19/2010) HIV-1 RNA: <20 copies/mL (02/19/2010)   HBSAg: NEG (06/03/2007)  Orders: T-Hepatitis  B Surface Antigen [phone]) T-Hepatitis B Surface Antibody [phone]) T-Hepatitis C Antibody ([id number])  Problem # 2:  HYPERTENSION (ICD-401.9) stable Her updated medication list for this problem includes:    Lisinopril-hydrochlorothiazide 10-12.5 Mg Tabs (Lisinopril-hydrochlorothiazide) .[name]... Take 1 tablet by mouth once a day  Other Orders: Est. Patient Level III (01027) TB Skin Test [phone]) Admin 1st Vaccine (44034) Future Orders: T-CD4SP ([facility]) (CD4SP) ... 06/18/2010 T-HIV Viral Load [id number]) ... 06/18/2010 T-Comprehensive Metabolic Panel [phone]) ... 06/18/2010 T-CBC w/Diff ([id number]) ... 06/18/2010 T-Lipid  Profile [phone]) ... 06/18/2010  Patient Instructions: 1)  Please schedule a follow-up appointment in 3 months, 2 weeks after labs. 2)  Schedule PAP in PAP clinic      Immunizations Administered:  PPD Skin Test:    Vaccine Type: PPD    Site: right forearm    Mfr: Sanofi Pasteur    Dose: 0.1 ml    Route: ID    Given by: Wendall Mola CMA ( AAMA)    Exp. Date: 09/13/2011    Lot #: [id number]  PPD Results    Date of reading: 03/23/2010    Results: < 5mm    Interpretation: negative

## 2010-04-10 NOTE — Progress Notes (Signed)
Summary: lab results  Phone Note Call from Patient   Caller: Patient Summary of Call: Pt. called wanting to know the results of her labs, concerning the tests on her liver. Initial call taken by: Wendall Mola CMA Duncan Dull),  March 24, 2010 11:59 AM  Follow-up for Phone Call        lftS IMPROVED Follow-up by: Yisroel Ramming MD,  March 25, 2010 4:27 PM  Additional Follow-up for Phone Call Additional follow up Details #1::        Pt. notified Additional Follow-up by: Wendall Mola CMA Duncan Dull),  March 25, 2010 4:47 PM

## 2010-04-10 NOTE — Miscellaneous (Signed)
Summary: HIPAA Restrictions  HIPAA Restrictions   Imported By: Florinda Marker 06/19/2009 16:17:21  _____________________________________________________________________  External Attachment:    Type:   Image     Comment:   External Document
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
## 2010-04-10 NOTE — Progress Notes (Signed)
Summary: ADAP rx arrived  Phone Note Outgoing Call   Call placed by: Jennet Maduro RN,  February 17, 2010 6:59 PM Call placed to: Patient Action Taken: Assistance medications ready for pick up Summary of Call: Dec. 2011 ADAP arrived.  Pt. notified.  Message left for pick up. Jennet Maduro RN  February 18, 2010 11:01 AM   Atripla tablets.  Jennet Maduro RN  February 17, 2010 7:00 PM

## 2010-04-10 NOTE — Progress Notes (Signed)
Summary: ncadap med arrived for Sept  Phone Note Refill Request      Prescriptions: ATRIPLA 600-200-300 MG TABS (EFAVIRENZ-EMTRICITAB-TENOFOVIR) Take 1 tablet by mouth at bedtime  #30 x 0   Entered by:   Paulo Fruit  BS,CPht II,MPH   Authorized by:   Yisroel Ramming MD   Signed by:   Paulo Fruit  BS,CPht II,MPH on 11/26/2009   Method used:   Samples Given   RxID:   [id number]  Patient Assist Medication Verification: Medication name: Atripla RX # [id number] Tech approval:MLD Call placed to patient with message that assistance medications are ready for pick-up. Paulo Fruit  BS,CPht II,MPH  November 26, 2009 1:37 PM

## 2010-04-10 NOTE — Assessment & Plan Note (Signed)
Summary: pt. of Dr. Danella Deis for 3 month visit /dde   CC:  follow-up visit, lab results, and needs refill for Lisinopril.  History of Present Illness: Pt here for f/u. She is still working but they cut her hours back. She missed a few doses of her HIV meds.  Preventive Screening-Counseling & Management  Alcohol-Tobacco     Alcohol drinks/day: occasional     Smoking Status: never  Caffeine-Diet-Exercise     Caffeine use/day: 2     Does Patient Exercise: yes     Type of exercise: walks     Exercise (avg: min/session): >60     Times/week: 7  Safety-Violence-Falls     Seat Belt Use: yes      Sexual History:  currently monogamous.        Drug Use:  never.        Blood Transfusions:  no.    Comments: pt. given condoms   Updated Prior Medication List: ATRIPLA 600-200-300 MG TABS (EFAVIRENZ-EMTRICITAB-TENOFOVIR) Take 1 tablet by mouth at bedtime NEURONTIN 300 MG CAPS (GABAPENTIN) Take 1 tablet by mouth three times a day IBUPROFEN 800 MG TABS (IBUPROFEN) Take 1 tablet by mouth every 8 hours PRILOSEC 20 MG CPDR (OMEPRAZOLE) Take 1 tablet by mouth once a day LISINOPRIL-HYDROCHLOROTHIAZIDE 10-12.5 MG TABS (LISINOPRIL-HYDROCHLOROTHIAZIDE) Take 1 tablet by mouth once a day  Current Allergies (reviewed today): No known allergies  Past History:  Past Medical History: Last updated: 01/19/2008 HIV disease Hypertension  Review of Systems  The patient denies anorexia, fever, and weight loss.    Vital Signs:  Patient profile:   50 year old female Menstrual status:  postmenopausal Height:      67 inches (170.18 cm) Weight:      162.8 pounds (74 kg) BMI:     25.59 Temp:     98.3 degrees F (36.83 degrees C) oral Pulse rate:   69 / minute BP sitting:   117 / 76  (right arm)  Vitals Entered By: Wendall Mola CMA Duncan Dull) (November 21, 2009 3:16 PM) CC: follow-up visit, lab results, needs refill for Lisinopril Is Patient Diabetic? No Pain Assessment Patient in pain?  no      Nutritional Status BMI of 25 - 29 = overweight Nutritional Status Detail appetite "good"  Does patient need assistance? Functional Status Self care Ambulation Normal Comments missed one dose of Atripla since last visit   Physical Exam  General:  alert, well-developed, well-nourished, and well-hydrated.   Head:  normocephalic and atraumatic.   Mouth:  pharynx pink and moist.   Lungs:  normal breath sounds.      Impression & Recommendations:  Problem # 1:  HIV DISEASE (ICD-042) Pt.s most recent CD4ct was 960 and VL <48 .  Pt instructed to continue the current antiretroviral regimen.  Pt encouraged to take medication regularly and not miss doses.  Pt will f/u in 3 months for repeat blood work and will see me 2 weeks later. Pt refused influenza vaccine.  Diagnostics Reviewed:  HIV: HIV positive - not AIDS (07/11/2009)   HIV-Western blot: Positive (06/03/2007)   CD4: 960 (10/23/2009)   WBC: 3.8 (10/22/2009)   Hgb: 12.0 (10/22/2009)   HCT: 36.0 (10/22/2009)   Platelets: 240 (10/22/2009) HIV-1 RNA: <48 copies/mL (10/22/2009)   HBSAg: NEG (06/03/2007)  Other Orders: Est. Patient Level III (16109) Future Orders: T-CD4SP ([facility]) (CD4SP) ... 02/19/2010 T-HIV Viral Load [phone]) ... 02/19/2010 T-Comprehensive Metabolic Panel [id number]) ... 02/19/2010 T-CBC w/Diff ([id number]) ... 02/19/2010  Patient  Instructions: 1)  Please schedule a follow-up appointment in 3 months, 2 weeks after labs.   Prescriptions: LISINOPRIL-HYDROCHLOROTHIAZIDE 10-12.5 MG TABS (LISINOPRIL-HYDROCHLOROTHIAZIDE) Take 1 tablet by mouth once a day  #30 x 5   Entered and Authorized by:   Yisroel Ramming MD   Signed by:   Yisroel Ramming MD on 11/21/2009   Method used:   Print then Give to Patient   RxID:   [id number] IBUPROFEN 800 MG TABS (IBUPROFEN) Take 1 tablet by mouth every 8 hours  #60 x 5   Entered and Authorized by:   Yisroel Ramming MD   Signed by:   Yisroel Ramming MD on  11/21/2009   Method used:   Print then Give to Patient   RxID:   [id number]

## 2010-04-10 NOTE — Progress Notes (Signed)
Summary: ADAP medications arrived  Phone Note Outgoing Call   Call placed by: Starleen Arms CMA,  December 27, 2009 4:12 PM Call placed to: Patient Summary of Call: Adap medications arrived, and patient notified. Initial call taken by: Starleen Arms CMA,  December 27, 2009 4:12 PM     Appended Document: ADAP medications arrived Patient p/u meds today.

## 2010-04-10 NOTE — Progress Notes (Signed)
Summary: ADAP rx ready for pick-up  Phone Note Outgoing Call   Call placed by: Jennet Maduro RN,  January 22, 2010 2:24 PM Call placed to: Patient Action Taken: Assistance medications ready for pick up Initial call taken by: Jennet Maduro RN,  January 22, 2010 2:24 PM    Prescriptions: ATRIPLA 600-200-300 MG TABS (EFAVIRENZ-EMTRICITAB-TENOFOVIR) Take 1 tablet by mouth at bedtime  #30 x 0   Entered by:   Jennet Maduro RN   Authorized by:   Yisroel Ramming MD   Signed by:   Jennet Maduro RN on 01/22/2010   Method used:   Samples Given   RxID:   [id number]  ADAP rx arrived.  Pt. informed that rx is ready for pick-up. Jennet Maduro RN  January 22, 2010 2:25 PM

## 2010-04-10 NOTE — Progress Notes (Signed)
Summary: [facility]/pt assist med arrived for may  Phone Note Refill Request      Prescriptions: ATRIPLA 600-200-300 MG TABS (EFAVIRENZ-EMTRICITAB-TENOFOVIR) Take 1 tablet by mouth at bedtime  #30 x 0   Entered by:   Paulo Fruit  BS,CPht II,MPH   Authorized by:   Yisroel Ramming MD   Signed by:   Paulo Fruit  BS,CPht II,MPH on 07/24/2009   Method used:   Samples Given   RxID:   [id number]   Patient Assist Medication Verification: Medication: Atripla Lot# [id number] Exp Date:10 2013 Tech approval:MLD Call placed to patient with message that assistance medications are ready for pick-up. Paulo Fruit  BS,CPht II,MPH  Jul 24, 2009 12:03 PM

## 2010-04-10 NOTE — Progress Notes (Addendum)
Summary: ADAP rx arrived, instructed to transfer to [facility]  Phone Note Outgoing Call   Call placed by: Jennet Maduro RN,  March 24, 2010 12:34 PM Call placed to: Patient Action Taken: Assistance medications ready for pick up Summary of Call: ADAP medications arrived.   Pt. instructed to transfer rx to the [facility] at [facility].  Given tel. # to transfer rx. Jennet Maduro RN  March 24, 2010 12:35 PM      Appended Document: ADAP rx arrived, instructed to transfer to [facility] Pt.instructed [place] rx transferred to [facility].  Picked up rx today

## 2010-04-10 NOTE — Progress Notes (Signed)
Summary: Cont. vaginal discharge   Phone Note Call from Patient Call back at Home Phone [phone]   Caller: Patient Reason for Call: Acute Illness Summary of Call: Continuing vaginal discharge after initial treatment from visit to the ED on 06/16/09.  RN spoke with Dr. Philipp Deputy.  Ordered self-wet prep.  Phone call to pt.  Pt. to come to clinic this AM for this lab test.  Jennet Maduro RN  July 03, 2009 9:49 AM   New Problems: VAGINAL DISCHARGE (ICD-623.5)   New Problems: VAGINAL DISCHARGE (ICD-623.5)  Process Orders Check Orders Results:     [facility]: ABN not required for this insurance Tests Sent for requisitioning (July 03, 2009 9:59 AM):     07/03/2009: [facility] -- T-Wet Prep by [facility] [phone] (signed)

## 2010-04-10 NOTE — Progress Notes (Signed)
Summary: lower abdominal pain  Phone Note Call from Patient   Caller: Patient [phone] Reason for Call: Acute Illness Details for Reason: c/o ongoing abdominal pain Summary of Call: pt. called c/o ongoing lower abdominal pain. Was prescribed Flagyl x 7 days, said she did complete the dose even though it made her stomach hurt worse.  Is requesting a new RX Initial call taken by: Wendall Mola CMA Duncan Dull),  Jul 24, 2009 4:45 PM

## 2010-04-10 NOTE — Medication Information (Signed)
Summary: [facility]enger transport [facility]: RX   Imported By: Florinda Marker 12/12/2009 08:44:35  _____________________________________________________________________  External Attachment:    Type:   Image     Comment:   External Document

## 2010-04-10 NOTE — Miscellaneous (Signed)
Summary: clinical update/ryan white  Clinical Lists Changes  Observations: Added new observation of AIDSDAP: Yes 2011 (07/03/2009 11:20) Added new observation of PCTFPL: 136.84  (07/03/2009 11:20) Added new observation of INCOMESOURCE: WAGES  (07/03/2009 11:20) Added new observation of HOUSEINCOME: 16109  (07/03/2009 11:20) Added new observation of #CHILD<18 IN: Yes  (07/03/2009 11:20) Added new observation of HOUSING: Stable/permanent  (07/03/2009 11:20) Added new observation of FINASSESSDT: 07/03/2009  (07/03/2009 11:20) Added new observation of YEARLYEXPEN: 20400  (07/03/2009 11:20)

## 2010-04-10 NOTE — Progress Notes (Signed)
Summary: [facility]/pt assist med arrived for Apr  Phone Note Refill Request      Prescriptions: ATRIPLA 600-200-300 MG TABS (EFAVIRENZ-EMTRICITAB-TENOFOVIR) Take 1 tablet by mouth at bedtime  #30 x 0   Entered by:   Paulo Fruit  BS,CPht II,MPH   Authorized by:   Yisroel Ramming MD   Signed by:   Paulo Fruit  BS,CPht II,MPH on 06/20/2009   Method used:   Samples Given   RxID:   [id number]   Patient Assist Medication Verification: Medication: Atripla Lot# [id number] Exp Date:09 2013 Tech approval:MLD Call placed to patient with message that assistance medications are ready for pick-up. Paulo Fruit  BS,CPht II,MPH  June 20, 2009 4:11 PM

## 2010-04-10 NOTE — Progress Notes (Signed)
Summary: Call about picking up meds.  Phone Note Call from Patient   Caller: Patient Summary of Call: Patient called to see about where to pick up medicine. Was instructed to come to the [facility] building to get medicine. Kathi Simpers [facility])  August 22, 2009 3:18 PM  Initial call taken by: Kathi Simpers [facility]),  August 22, 2009 3:18 PM

## 2010-04-10 NOTE — Progress Notes (Signed)
Summary: con't abd pain  Phone Note Call from Patient   Caller: Patient Summary of Call: Pt says she is not any better.  The flagyl caused her to have diarrhea and rectal irritation.  She is still having the lower abd pain as she was  during the May 05 visit.  Please advise. Tomasita Morrow RN  Jul 19, 2009 4:55 PM   Follow-up for Phone Call        where is the pain located and any additional symptoms? Follow-up by: Yisroel Ramming MD,  Jul 24, 2009 9:50 AM  Additional Follow-up for Phone Call Additional follow up Details #1::        I called to let pt know her Atripla was here and ready for her to pick up.  She asked me if anything else was called in for her pain.  I read your follow-up and asked her where is her pain.  Fe said her stomach area and she still has diarrhea.  She also wanted you to know that the Flagyl you ordered for her made her stomach hurt.  She said the pain is not as bad as it was but it still hurts. Additional Follow-up by: Paulo Fruit  BS,CPht II,MPH,  Jul 24, 2009 12:05 PM    Additional Follow-up for Phone Call Additional follow up Details #2::    she can try OTC immodium and prilosec (med list updated) Follow-up by: Yisroel Ramming MD,  Jul 24, 2009 3:43 PM  Additional Follow-up for Phone Call Additional follow up Details #3:: Details for Additional Follow-up Action Taken: the CMA-Jackie took care of this. Additional Follow-up by: Paulo Fruit  BS,CPht II,MPH,  Aug 01, 2009 12:19 PM  New/Updated Medications: PRILOSEC 20 MG CPDR (OMEPRAZOLE) Take 1 tablet by mouth once a day

## 2010-04-10 NOTE — Miscellaneous (Signed)
Summary: Office Visit ([facility] 05)    Vital Signs:  Patient profile:   50 year old female Menstrual status:  postmenopausal Weight:      170 pounds Temp:     98.5 degrees F oral Pulse rate:   66 / minute Pulse rhythm:   regular Resp:     18 per minute BP sitting:   96 / 64  (left arm)  Vitals Entered By: Sharen Heck RN (April 18, 2009 11:13 AM) CC: f/u 05 Is Patient Diabetic? No Pain Assessment Patient in pain? no       Does patient need assistance? Functional Status Self care Ambulation Normal   CC:  f/u 05.  History of Present Illness: Pt here for f/u.  No new complaints.  Preventive Screening-Counseling & Management  Alcohol-Tobacco     Alcohol drinks/day: 0     Smoking Status: never  Caffeine-Diet-Exercise     Caffeine use/day: 2     Does Patient Exercise: yes     Type of exercise: walks     Exercise (avg: min/session): >60     Times/week: 7  Current Problems (verified): 1)  Sore Throat  (ICD-462) 2)  Urinary Frequency  (ICD-788.41) 3)  Abdominal Pain, Lower  (ICD-789.09) 4)  Knee Pain, Right  (ICD-719.46) 5)  Peripheral Neuropathy  (ICD-356.9) 6)  Hypertension  (ICD-401.9) 7)  HIV Disease  (ICD-042)  Current Medications (verified): 1)  Atripla 600-200-300 Mg Tabs (Efavirenz-Emtricitab-Tenofovir) .... Take 1 Tablet By Mouth At Bedtime 2)  Neurontin 300 Mg Caps (Gabapentin) .... Take 1 Tablet By Mouth Three Times A Day 3)  Ibuprofen 800 Mg Tabs (Ibuprofen) .... Take 1 Tablet By Mouth Every 8 Hours  Allergies: No Known Drug Allergies   Review of Systems  The patient denies anorexia, fever, and weight loss.     Physical Exam  General:  alert, well-developed, well-nourished, and well-hydrated.   Head:  normocephalic and atraumatic.   Mouth:  pharynx pink and moist.   Lungs:  normal breath sounds.          Medication Adherence: 04/18/2009   Adherence to medications reviewed with patient. Counseling to provide adequate adherence  provided   Prevention For Positives: 04/18/2009   Safe sex practices discussed with patient. Condoms offered.                             Impression & Recommendations:  Problem # 1:  HIV DISEASE (ICD-042) Last CD4ct was 916 and VL <48. She is to continue her current meds and try not to miss any doses. She will f/u in 4 weeks for repeat labs. Diagnostics Reviewed:  HIV: REACTIVE (06/03/2007)   HIV-Western blot: Positive (06/03/2007)   CD4: 798 (06/18/2008)   WBC: 3.6 (02/12/2009)   Hgb: 11.8 (02/12/2009)   HCT: 36.3 (02/12/2009)   Platelets: 270 (02/12/2009) HIV-1 RNA: <48 copies/mL (02/12/2009)   HBSAg: NEG (06/03/2007)  Other Orders: Est. Patient Level III (16109) Future Orders: T-CBC w/Diff ([id number]) ... 05/13/2009 T-CD4SP ([facility]) (CD4SP) ... 05/13/2009 T-Comprehensive Metabolic Panel [phone]) ... 05/13/2009 T-HIV Viral Load [id number]) ... 05/13/2009    Patient Instructions: 1)  Please schedule a follow-up appointment in 6 weeks.

## 2010-04-10 NOTE — Miscellaneous (Signed)
Summary: RW Update  Clinical Lists Changes  Observations: Added new observation of DATE1STVISIT: 07/11/2009 (10/02/2009 12:11) Added new observation of RWPARTICIP: Yes (10/02/2009 12:11)

## 2010-04-10 NOTE — Progress Notes (Signed)
Summary: ncadap med arrived for Aug  Phone Note Refill Request      Prescriptions: ATRIPLA 600-200-300 MG TABS (EFAVIRENZ-EMTRICITAB-TENOFOVIR) Take 1 tablet by mouth at bedtime  #30 x 0   Entered by:   Paulo Fruit  BS,CPht II,MPH   Authorized by:   Yisroel Ramming MD   Signed by:   Paulo Fruit  BS,CPht II,MPH on 10/24/2009   Method used:   Samples Given   RxID:   [id number]  Patient Assist Medication Verification: Medication name: Atripla RX # [id number] Tech approval:MLD Call placed to patient with message that assistance medications are ready for pick-up. Paulo Fruit  BS,CPht II,MPH  October 24, 2009 3:53 PM

## 2010-04-10 NOTE — Progress Notes (Signed)
Summary: call from [facility] re: heavy metal testing  Phone Note Other Incoming   Details for Reason: call from [facility]. Patient with orders for heavy metal testing and copper level testing. Had enough to do copper, needs another blue top with additive to do heavy metal. call to patient to reschedule for lab draw, no answer and machine is not taking messages. given to front office to make attempts to schedule patient  Initial call taken by: Blondell Reveal RN,  April 23, 2009 10:41 AM

## 2010-04-10 NOTE — Progress Notes (Signed)
Summary: Request for  BP medication  Phone Note From Pharmacy   Caller: [facility] Reason for Call: Needs renewal Summary of Call: Received a refill request for patient's Lisinopril/HCTZ 10-12.5mg .  once daily.  It was last filled according to them on 07/16/09 however the EMR is showing that this medication has been stopped with no other BP medication listed since 12/17/08. Did you want to restart this medication or place her on a different BP med? Initial call taken by: Paulo Fruit  BS,CPht II,MPH,  August 27, 2009 10:28 AM  Follow-up for Phone Call        ok to re-start Follow-up by: Yisroel Ramming MD,  August 27, 2009 2:15 PM    New/Updated Medications: LISINOPRIL-HYDROCHLOROTHIAZIDE 10-12.5 MG TABS (LISINOPRIL-HYDROCHLOROTHIAZIDE) Take 1 tablet by mouth once a day Prescriptions: LISINOPRIL-HYDROCHLOROTHIAZIDE 10-12.5 MG TABS (LISINOPRIL-HYDROCHLOROTHIAZIDE) Take 1 tablet by mouth once a day  #30 x 5   Entered by:   Paulo Fruit  BS,CPht II,MPH   Authorized by:   Yisroel Ramming MD   Signed by:   Paulo Fruit  BS,CPht II,MPH on 08/27/2009   Method used:   Telephoned to ...       [facility] (retail)       [address] [place], [place]  [address]       Ph: [phone]       Fax: [phone]   RxID:   [id number]  Paulo Fruit  BS,CPht II,MPH  August 27, 2009 2:44 PM

## 2010-04-10 NOTE — Progress Notes (Signed)
Summary: med refill  Phone Note Refill Request   Refills Requested: Medication #1:  ATRIPLA 600-200-300 MG TABS Take 1 tablet by mouth at bedtime   Dosage confirmed as above?Dosage Confirmed   Supply Requested: 3 months   Last Refilled: 05/17/2009 no show for last appt.  [facility] called Gaylyn Cheers RN  May 29, 2009 5:23 PM    Method Requested: Telephone to [facility] [phone] Initial call taken by: Gaylyn Cheers RN,  May 23, 2009 12:32 PM  Follow-up for Phone Call        ok x 3 Follow-up by: Yisroel Ramming MD,  May 28, 2009 3:05 PM

## 2010-04-28 ENCOUNTER — Encounter (INDEPENDENT_AMBULATORY_CARE_PROVIDER_SITE_OTHER): Payer: Self-pay | Admitting: *Deleted

## 2010-05-06 NOTE — Miscellaneous (Signed)
Summary: ADAP update  Clinical Lists Changes  Observations: Added new observation of FAMILYSIZE: 2  (04/28/2010 10:53) Added new observation of PCTFPL: 81.37  (04/28/2010 10:53) Added new observation of HOUSEINCOME: [address]  (04/28/2010 10:53)      Net income [phone]

## 2010-05-19 LAB — T-HELPER CELL (CD4) - (RCID CLINIC ONLY): CD4 % Helper T Cell: 54 % (ref 33–55)

## 2010-05-23 LAB — T-HELPER CELL (CD4) - (RCID CLINIC ONLY): CD4 T Cell Abs: 960 uL (ref 400–2700)

## 2010-05-28 LAB — WET PREP, GENITAL

## 2010-05-28 LAB — URINALYSIS, ROUTINE W REFLEX MICROSCOPIC
Ketones, ur: NEGATIVE mg/dL
Nitrite: NEGATIVE
Specific Gravity, Urine: 1.007 (ref 1.005–1.030)
pH: 6 (ref 5.0–8.0)

## 2010-05-28 LAB — GC/CHLAMYDIA PROBE AMP, GENITAL: GC Probe Amp, Genital: NEGATIVE

## 2010-05-28 LAB — RPR: RPR Ser Ql: NONREACTIVE

## 2010-05-28 LAB — T-HELPER CELL (CD4) - (RCID CLINIC ONLY): CD4 T Cell Abs: 740 uL (ref 400–2700)

## 2010-06-19 ENCOUNTER — Other Ambulatory Visit: Payer: Self-pay

## 2010-07-03 ENCOUNTER — Ambulatory Visit: Payer: Self-pay | Admitting: Adult Health

## 2010-07-07 ENCOUNTER — Emergency Department (HOSPITAL_COMMUNITY)
Admission: EM | Admit: 2010-07-07 | Discharge: 2010-07-07 | Disposition: A | Discharge status: Home / Self Care | Payer: Self-pay | Attending: Emergency Medicine | Admitting: Emergency Medicine

## 2010-07-07 DIAGNOSIS — I10 Essential (primary) hypertension: Secondary | ICD-10-CM | POA: Insufficient documentation

## 2010-07-07 DIAGNOSIS — Z21 Asymptomatic human immunodeficiency virus [HIV] infection status: Secondary | ICD-10-CM | POA: Insufficient documentation

## 2010-07-07 DIAGNOSIS — H6120 Impacted cerumen, unspecified ear: Secondary | ICD-10-CM | POA: Insufficient documentation

## 2010-07-07 DIAGNOSIS — H9209 Otalgia, unspecified ear: Secondary | ICD-10-CM | POA: Insufficient documentation

## 2010-07-10 ENCOUNTER — Other Ambulatory Visit: Payer: Self-pay | Admitting: Adult Health

## 2010-07-10 ENCOUNTER — Other Ambulatory Visit: Payer: Self-pay

## 2010-07-10 DIAGNOSIS — B20 Human immunodeficiency virus [HIV] disease: Secondary | ICD-10-CM

## 2010-07-11 ENCOUNTER — Ambulatory Visit: Payer: Self-pay | Admitting: Adult Health

## 2010-07-15 ENCOUNTER — Telehealth: Payer: Self-pay | Admitting: *Deleted

## 2010-07-15 NOTE — Telephone Encounter (Signed)
Need atripla called in  [facility] on [address]. Has one pill left.  Asked her to call 2 weeks in advance in the future. She did not know that ADAP needs to be recertified every 6 months. I gave her the number to make an appt with Britta Mccreedy.  The pharmacist states it is an insurance problem & will need to re-apply for ADAP

## 2010-07-17 ENCOUNTER — Other Ambulatory Visit: Payer: Self-pay | Admitting: Infectious Diseases

## 2010-07-17 ENCOUNTER — Ambulatory Visit: Payer: Self-pay

## 2010-07-17 ENCOUNTER — Other Ambulatory Visit (INDEPENDENT_AMBULATORY_CARE_PROVIDER_SITE_OTHER): Payer: Self-pay

## 2010-07-17 DIAGNOSIS — B20 Human immunodeficiency virus [HIV] disease: Secondary | ICD-10-CM

## 2010-07-17 LAB — COMPREHENSIVE METABOLIC PANEL
AST: 58 U/L — ABNORMAL HIGH (ref 0–37)
Albumin: 3.7 g/dL (ref 3.5–5.2)
Alkaline Phosphatase: 217 U/L — ABNORMAL HIGH (ref 39–117)
BUN: 10 mg/dL (ref 6–23)
Glucose, Bld: 84 mg/dL (ref 70–99)
Potassium: 3.5 mEq/L (ref 3.5–5.3)
Sodium: 140 mEq/L (ref 135–145)
Total Bilirubin: 0.3 mg/dL (ref 0.3–1.2)
Total Protein: 7.2 g/dL (ref 6.0–8.3)

## 2010-07-18 LAB — CBC WITH DIFFERENTIAL/PLATELET
Basophils Relative: 0 % (ref 0–1)
Eosinophils Absolute: 0.1 10*3/uL (ref 0.0–0.7)
Eosinophils Relative: 1 % (ref 0–5)
Hemoglobin: 11.5 g/dL — ABNORMAL LOW (ref 12.0–15.0)
Lymphs Abs: 1.7 10*3/uL (ref 0.7–4.0)
MCH: 31.6 pg (ref 26.0–34.0)
MCHC: 32.9 g/dL (ref 30.0–36.0)
MCV: 96.2 fL (ref 78.0–100.0)
Monocytes Relative: 7 % (ref 3–12)
RBC: 3.64 MIL/uL — ABNORMAL LOW (ref 3.87–5.11)

## 2010-07-18 LAB — T-HELPER CELL (CD4) - (RCID CLINIC ONLY): CD4 % Helper T Cell: 56 % — ABNORMAL HIGH (ref 33–55)

## 2010-07-21 LAB — HIV-1 RNA QUANT-NO REFLEX-BLD
HIV 1 RNA Quant: <20 {copies}/mL
HIV-1 RNA Quant, Log: <1.3 {Log}

## 2010-07-24 ENCOUNTER — Ambulatory Visit: Payer: Self-pay | Admitting: Adult Health

## 2010-07-25 NOTE — Op Note (Signed)
NAME:  Vicki Brown, Vicki Brown                          ACCOUNT NO.:  [id number]   MEDICAL RECORD NO.:  [id number]                   PATIENT TYPE:  AMB   LOCATION:  DSC                                  FACILITY:   PHYSICIAN:  John L. Rendall III, M.D.           DATE OF BIRTH:  02/04/1961   DATE OF PROCEDURE:  05/29/2002  DATE OF DISCHARGE:                                 OPERATIVE REPORT   PREOPERATIVE DIAGNOSIS:  Displaced volar Barton's fracture, left wrist.   POSTOPERATIVE DIAGNOSIS:  Displaced volar Barton's fracture, left wrist.   OPERATION/PROCEDURE:  Open reduction/internal fixation volar Barton's  fracture, left wrist, using Innovasive screw-in plates.   SURGEON:  John L. Rendall, M.D.   ASSISTANT:  Madilyn Fireman, P.A.-C.   ANESTHESIA:  General.   PATHOLOGY:  The patient has a volar Barton's fracture including virtually  all the articular surface.  Initially it was displaced.  It was reduced  virtually anatomically but three days later in the office, it was sliding  volar again and the decision was made for open reduction/internal fixation.  At the time of surgery the volar Laurence Compton had slid proximally approximately 4  mm.   DESCRIPTION OF PROCEDURE:  Under general anesthesia, the left arm was  prepared with Betadine and draped as a sterile field.  The arm was wrapped  out with an Esmarch and the tourniquet on the upper biceps was used at 250  mmHg.  An incision was made parallel to the FCR tendon curving radially at  the wrist flexion crease and proximally along the base of thenar imminence.  Dissection was carried down through skin and subcutaneous tissue to the FCR  tendon.  The tendon sheath was opened.  The tendon was retracted to the  radial side.  Dissection was carried through the volar surface of the tendon  sheath to the pronator quadratus. This was dissected from the radial side of  the bone and elevated.  The fracture was clearly seen.  Baby Holman's were  placed  medially and laterally.  The fracture is manipulated and reduced.  Volar plate is applied and the screw is placed in the sliding hole.  The C-  arm is then used to assist in placement of the distal pegs and screws.  Three pegs and one screw were placed in the distal portion of the plate in  virtually anatomic.  A lateral view is obtained.  Additional two screws are  placed into the plate.  These are 3.5 mm screws, placed in 2.5 mm drill  holes.  With the entire apparatus in place, the pronator quadratus is  resutured to its origin.  The subcu is sutured with one layer of 2-0 Vicryl,  skin with clips.  Sterile dressing and volar plaster splint are applied.  The patient returned to recovery in good condition.  John L. Dorothyann Gibbs, M.D.    Renato Gails  D:  05/29/2002  T:  05/29/2002  Job:  [id number]

## 2010-07-31 ENCOUNTER — Ambulatory Visit (INDEPENDENT_AMBULATORY_CARE_PROVIDER_SITE_OTHER): Payer: Self-pay | Admitting: Adult Health

## 2010-07-31 ENCOUNTER — Encounter: Payer: Self-pay | Admitting: Adult Health

## 2010-07-31 DIAGNOSIS — E785 Hyperlipidemia, unspecified: Secondary | ICD-10-CM

## 2010-07-31 DIAGNOSIS — Z79899 Other long term (current) drug therapy: Secondary | ICD-10-CM

## 2010-07-31 DIAGNOSIS — Z113 Encounter for screening for infections with a predominantly sexual mode of transmission: Secondary | ICD-10-CM

## 2010-07-31 DIAGNOSIS — B20 Human immunodeficiency virus [HIV] disease: Secondary | ICD-10-CM

## 2010-07-31 DIAGNOSIS — R450 Nervousness: Secondary | ICD-10-CM

## 2010-07-31 NOTE — Progress Notes (Signed)
  Subjective:    Patient ID: Vicki Brown, female    DOB: January 07, 1961, 50 y.o.   MRN: [id number]  HPI Presents to clinic for followup. Remains adherent to her antiretrovirals with good tolerance and no complications. Presents with complaints of "nerves" stating that she seems to be very "jumpy." States since feeling this way. She has increased her intake of alcohol and marijuana claiming that she knows that this is not healthy, and she has been "cutting back." Denies depressive feelings or feelings of agitation. Denies hallucinations or delusional thoughts of harming self or others.   Review of Systems  Constitutional: Negative.   HENT: Negative.   Eyes: Negative.   Respiratory: Negative.   Cardiovascular: Negative.   Gastrointestinal: Negative.   Genitourinary: Negative.   Musculoskeletal: Negative.   Neurological: Negative.   Hematological: Negative.   Psychiatric/Behavioral: Negative for suicidal ideas, hallucinations, behavioral problems, confusion, sleep disturbance, self-injury, dysphoric mood, decreased concentration and agitation. The patient is nervous/anxious. The patient is not hyperactive.        Objective:   Physical Exam  Constitutional: She is oriented to person, place, and time. She appears well-developed and well-nourished. No distress.  HENT:  Head: Normocephalic and atraumatic.  Nose: Nose normal.  Mouth/Throat: Oropharynx is clear and moist.  Eyes: Conjunctivae and EOM are normal. Pupils are equal, round, and reactive to light.  Neck: Normal range of motion.  Cardiovascular: Normal rate and regular rhythm.   Pulmonary/Chest: Effort normal and breath sounds normal.  Abdominal: Soft. Bowel sounds are normal.  Musculoskeletal: Normal range of motion.  Neurological: She is alert and oriented to person, place, and time. No cranial nerve deficit. She exhibits normal muscle tone. Coordination normal.  Skin: Skin is warm and dry.  Psychiatric: She has a normal mood  and affect. Her behavior is normal. Judgment and thought content normal.          Assessment & Plan:  1. HIV. Labs obtained 07/17/2010 show a CD4 count of 900 at 56% with an HIV viral load of less than 20 copies per mL. Clinically stable on Atripla. Recommend continuing present management repeating fasting labs in 10 weeks with a followup in 3 months.  2. Dyslipidemia. Review of labs from January 2012. Show a total cholesterol of 2:15, serum triglycerides 165, and LDL of 106. Discussed low-fat, carb restricted diet and recommended on next blood draw that she fast for 8-10 hours before coming to clinic and we will review these on her followup.  3. Anxiety. She is not certain of the etiology of these current symptoms, but she is identifying, nonproductive measures of coping, and has already taken action to reduce these behaviors. We agreed that once she stops utilizing alcohol and marijuana as treatments, we will discuss other possible measures to address her anxiety and nervousness.  4. Routine Health Maintenance. She is overdue for Pap smear and was instructed to schedule an appointment with Mrs. Lennox Laity to have one performed.  She verbally acknowledged all information that was provided for her and agreed with plan of care.

## 2010-08-15 ENCOUNTER — Other Ambulatory Visit: Payer: Self-pay | Admitting: *Deleted

## 2010-08-15 ENCOUNTER — Ambulatory Visit (INDEPENDENT_AMBULATORY_CARE_PROVIDER_SITE_OTHER): Payer: Self-pay | Admitting: *Deleted

## 2010-08-15 DIAGNOSIS — M25569 Pain in unspecified knee: Secondary | ICD-10-CM

## 2010-08-15 DIAGNOSIS — Z124 Encounter for screening for malignant neoplasm of cervix: Secondary | ICD-10-CM

## 2010-08-15 DIAGNOSIS — I10 Essential (primary) hypertension: Secondary | ICD-10-CM

## 2010-08-15 MED ORDER — LISINOPRIL-HYDROCHLOROTHIAZIDE 10-12.5 MG PO TABS: 1.0000 | ORAL_TABLET | Freq: Every day | ORAL | Status: DC

## 2010-08-15 MED ORDER — IBUPROFEN 800 MG PO TABS: 800.0000 mg | ORAL_TABLET | Freq: Three times a day (TID) | ORAL | Status: DC | PRN

## 2010-08-15 NOTE — Patient Instructions (Signed)
  Your results will be ready in about a week.  I will mail them to you at that time.  Thank you for coming to the Center for your care.

## 2010-09-05 ENCOUNTER — Encounter: Payer: Self-pay | Admitting: *Deleted

## 2010-10-17 ENCOUNTER — Ambulatory Visit (INDEPENDENT_AMBULATORY_CARE_PROVIDER_SITE_OTHER): Payer: Self-pay | Admitting: Adult Health

## 2010-10-17 ENCOUNTER — Encounter: Payer: Self-pay | Admitting: Adult Health

## 2010-10-17 ENCOUNTER — Encounter: Payer: Self-pay | Admitting: Licensed Clinical Social Worker

## 2010-10-17 VITALS — BP 111/79 | HR 79 | Temp 98.6°F | Wt 161.5 lb

## 2010-10-17 DIAGNOSIS — S339XXA Sprain of unspecified parts of lumbar spine and pelvis, initial encounter: Secondary | ICD-10-CM

## 2010-10-17 DIAGNOSIS — S39012A Strain of muscle, fascia and tendon of lower back, initial encounter: Secondary | ICD-10-CM

## 2010-10-17 MED ORDER — HYDROCODONE-ACETAMINOPHEN 5-325 MG PO TABS: 1.0000 | ORAL_TABLET | Freq: Four times a day (QID) | ORAL | Status: AC | PRN

## 2010-10-17 NOTE — Patient Instructions (Signed)
Back Pain (Lumbosacral Strain) Back pain is one of the most common causes of pain. There are many causes of back pain. Most are not serious conditions.  CAUSES Your backbone (spinal column) is made up of 24 main vertebral bodies, the sacrum, and the coccyx. These are held together by muscles and tough, fibrous tissue (ligaments). Nerve roots pass through the openings between the vertebrae. A sudden move or injury to the back may cause injury to, or pressure on, these nerves. This may result in localized back pain or pain movement (radiation) into the buttocks, down the leg, and into the foot. Sharp, shooting pain from the buttock down the back of the leg (sciatica) is frequently associated with a ruptured (herniated) disc. Pain may be caused by muscle spasm alone. Your caregiver can often find the cause of your pain by the details of your symptoms and an exam. In some cases, you may need tests (such as X-rays). Your caregiver will work with you to decide if any tests are needed based on your specific exam. HOME CARE INSTRUCTIONS  Avoid an underactive lifestyle. Active exercise, as directed by your caregiver, is your greatest weapon against back pain.   Avoid hard physical activities (tennis, racquetball, water-skiing) if you are not in proper physical condition for it. This may aggravate and/or create problems.   If you have a back problem, avoid sports requiring sudden body movements. Swimming and walking are generally safer activities.   Maintain good posture.   Avoid becoming overweight (obese).   Use bed rest for only the most extreme, sudden (acute) episode. Your caregiver will help you determine how much bed rest is necessary.   For acute conditions, you may put ice on the injured area.   Put ice in a plastic bag.   Place a towel between your skin and the bag.   Leave the ice on for 20 minutes at a time, every 2 hours, or as needed.   After you are improved and more active, it may  help to apply heat for 30 minutes before activities.  See your caregiver if you are having pain that lasts longer than expected. Your caregiver can advise appropriate exercises and/or therapy if needed. With conditioning, most back problems can be avoided. SEEK IMMEDIATE MEDICAL CARE IF:  You have numbness, tingling, weakness, or problems with the use of your arms or legs.   You experience severe back pain not relieved with medicines.   There is a change in bowel or bladder control.   You have increasing pain in any area of the body, including your belly (abdomen).   You notice shortness of breath, dizziness, or feel faint.   You feel sick to your stomach (nauseous), are throwing up (vomiting), or become sweaty.   You notice discoloration of your toes or legs, or your feet get very cold.   Your back pain is getting worse.   You have an oral temperature above 102, not controlled by medicine.  MAKE SURE YOU:    Understand these instructions.   Will watch your condition.   Will get help right away if you are not doing well or get worse.  Document Released: 12/03/2004 Document Re-Released: 05/20/2009 [facility] Patient Information 2011 [place], [place].  Back Injury Prevention Back injuries are extremely painful, difficult to heal, and have an effect on everything you do. After suffering one back injury, you are much more likely to experience another later on. It is important to learn how to avoid injuring or  re-injuring your back. You can learn proper lifting techniques and the basics of back safety, saving yourself a lot of pain and a lifetime of back problems.   WHY DO BACK INJURIES OCCUR?  The lower part of the back holds most of the body's weight.   Every time you bend over, lift a heavy object, or sit leaning forward, you put stress on your spine.   Over time, the discs between your vertebrae can start to wear out and become damaged.   Repetitive bending and lifting can  quickly cause back problems.   Even leaning forward while sitting at a desk or table can eventually cause damage and pain.  The following are contributing factors, and related tips to prevent back injury: POOR PHYSICAL CONDITION, EXTRA WEIGHT, AND INACTIVITY  Your stomach muscles provide a lot of the support needed by your back.   If you have weak stomach muscles, your back may not get all the support it needs, especially when you're lifting or carrying heavy objects.   Good general physical condition is important for preventing strains, sprains, and other injuries. Exercise regularly and try to develop good tone in your abdominal (stomach) muscles.   The more you weigh, the more stress is placed on your back every time you bend over, at a ratio of 10:1. For every pound of weight, 10 times that amount of pressure is placed on the back.   Regular aerobic exercise (walking, jogging, biking, swimming) has been shown to decrease back injuries.   Exercises that increase balance and strength can decrease your risk of falling and injuring your back or breaking bones. Exercises such as tai chi and yoga, or any weight-bearing exercise that challenges your balance, are good for increasing balance and strength.   Stretching and strengthening exercises can also reduce the risk of injuries.   Maintaining your ideal body weight is also important for having a healthy back.  DIET  In order to keep your spine strong, you will need to get enough calcium and vitamin D in your diet, to help prevent osteoporosis (bones becoming full of pores and weak, with age or diet deficiency).   Osteoporosis is responsible for many bone fractures that lead to back pain.   Calcium can be found in dairy products, green, leafy vegetables, and products with calcium added (fortified), like some orange juices.   Although your skin makes vitamin D when you are in the sun, you can also get it from your diet.   Vitamin D is  found in milk and foods that have this vitamin added (fortified).   Many adults do not get enough calcium and vitamin D in their diet.   You should talk to your caregiver about how much calcium and vitamin D you need per day. Consider taking a nutritional supplement or a multivitamin.  POOR POSTURE  Sit and stand up straight.   It is best to try to maintain the back in its natural slight "S" shaped curve.   Avoid leaning forward (unsupported) when you sit, or hunching over while you are standing.   A chair with good lumbar (low back) support helps protect the back when you sit.   If you work at a desk, sit close to your work so you do not need to lean over. Keep your chin tucked in. Keep your neck drawn back and elbows bent at 90 degrees to your spine.   Sit high and close to the steering wheel when you drive. Add  a lumbar support to your car seat, if needed.   Avoid sitting or standing too long in one position. Sitting can be hard on the lower back. Take breaks, get up, stretch and walk around frequently (at least every hour).   Avoid sleeping in an unnatural position. Sleep on your side (with knees slightly bent), or on your back (with a pillow under your knees). Do not sleep on your stomach.  OVER-EXERTION AND SUDDEN TWISTS OR MOVEMENTS  Avoid working in odd, uncomfortable positions, such as when gardening, kneeling, or doing tasks that require you to bend over for long periods of time.   STRETCH BEFORE EXERTING: If you know you will be doing work that might stress your back, take the time to stretch and loosen your muscles before starting, just like an athlete before a workout. This will help you avoid painful strains and sprains.   SLOW DOWN: If you're doing a lot of heavy, repetitive lifting, work slowly. Allow yourself more recovery time between lifts. Over working your back will cost you more time later, when you need medical attention or when you find every movement painful.    It is important to recognize your physical limitations and abilities.   Do not hesitate to say, "This is too heavy for me to lift alone."   Many people have injured their backs because they were afraid to ask for help. ASK FOR HELP!   Certain actions, motions and movements are more likely than others to cause or contribute to back injuries.   Avoid heavy lifting, especially repetitive lifting over a long period of time.   Avoid twisting at the waist, while lifting or holding a heavy load.   Avoid reaching and lifting over your head, across a table, or for an object on an elevated surface. Do not lean forward and lift heavy objects that are far from your body.   Concentrate on keeping your shoulders pulled back and your low back straight.   Never bend over without bending your knees.   Bend at your knees, instead of your back, when you pick up objects. Instead of using your back like a crane, let your legs do the work.   Try not to lift heavy things higher than your waist, or reach for objects on shelves above your head.   WHEN LIFTING:   Take a balanced stance, with your feet about shoulder width apart. One foot can be behind the object and the other next to it.   Squat down to lift the object, but keep your heels off the floor.   Get as close to the object to be lifted as you can.   Lift gradually, without jerking, using (tightening) your leg, abdominal and buttock muscles. Keep the load as close to you as possible.   Keep your back straight.   Keep your chin tucked in, to maintain a relatively straight back and neck line.   Once you're standing, change directions by pointing your feet in the direction you want to go and turning your whole body. Avoid twisting at your waist while carrying a load.   When you put a load down, use these same guidelines in reverse.   REDUCE the amount of weight lifted. If you're moving books, it is better to load several small boxes rather  than one heavy box.   Use handles and lifting straps.   Do not turn or twist while holding an object. Turn at your feet, not your back.  Avoid lifting or carrying objects with awkward or odd shapes. Get help if the shape is too awkward for you to lift and move by yourself.   The use of wide elastic belts, that can be worn and tightened to "pull in" lumbar and abdominal muscles, to prevent low back pain, is controversial.  STRESS  Tense muscles are more vulnerable to strains and spasms.   Mental stress that you experience is directed to your muscles, neck, and back.   Find constructive ways, including exercise and other relaxation techniques, to decrease the stress you experience and decrease its impact on your body.   Massage can help reduce the stress built up in your muscles and back.  ENVIRONMENTAL CAUSES AND SOLUTIONS  You could injure your back from slipping on a wet floor or ice. Avoid wet and newly mopped surfaces, and make sure that ice around your home and office walkways is removed or treated.   Sleeping on a mattress that is too soft or too hard can hurt your back. If too soft, consider inserting a large plywood board between your mattress and box spring, or replacing your mattress. Mattresses and box springs should be replaced every 10 to 15 years, depending on the product.   Place, store, and position objects up off the floor. That way, you will not need to reach down to pick them up again.   Raise or lower shelves, so you do not need to reach, or twist your back, neck, and shoulders.   Put heavier objects on shelves at waist level, and lighter objects on lower or higher shelves.   Use carts and dollies to move objects, rather than carrying them yourself.   It is better to push a cart, dolly, lawnmower, or wheelbarrow, than it is to pull it. If you do need to pull it, force yourself to tighten your stomach muscles and try to maintain good body posture.   Use cranes,  hoists, a lift table, or other lift-assist devices whenever you can.  Your caregiver can provide additional information about preventing back (and other injuries) when you seek care, to avoid a first or recurrent back injury. IF YOU INJURE YOUR BACK, VISIT YOUR CAREGIVER SO YOU CAN BE ASSESSED AND TREATED. Document Released: 04/02/2004 Document Re-Released: 02/11/2009 [facility] Patient Information 2011 [place], [place].

## 2010-10-20 ENCOUNTER — Other Ambulatory Visit: Payer: Self-pay | Admitting: Infectious Diseases

## 2010-10-20 ENCOUNTER — Other Ambulatory Visit: Payer: Self-pay

## 2010-10-20 ENCOUNTER — Other Ambulatory Visit (INDEPENDENT_AMBULATORY_CARE_PROVIDER_SITE_OTHER): Payer: Self-pay

## 2010-10-20 DIAGNOSIS — E785 Hyperlipidemia, unspecified: Secondary | ICD-10-CM

## 2010-10-20 DIAGNOSIS — Z79899 Other long term (current) drug therapy: Secondary | ICD-10-CM

## 2010-10-20 DIAGNOSIS — B20 Human immunodeficiency virus [HIV] disease: Secondary | ICD-10-CM

## 2010-10-20 DIAGNOSIS — Z113 Encounter for screening for infections with a predominantly sexual mode of transmission: Secondary | ICD-10-CM

## 2010-10-20 LAB — LIPID PANEL
HDL: 65 mg/dL (ref >39)
LDL Cholesterol: 87 mg/dL (ref 0–99)
Total CHOL/HDL Ratio: 2.7 Ratio

## 2010-10-21 LAB — CBC WITH DIFFERENTIAL/PLATELET
Basophils Absolute: 0 10*3/uL (ref 0.0–0.1)
HCT: 32.7 % — ABNORMAL LOW (ref 36.0–46.0)
Hemoglobin: 10.5 g/dL — ABNORMAL LOW (ref 12.0–15.0)
Lymphocytes Relative: 44 % (ref 12–46)
Lymphs Abs: 1.6 10*3/uL (ref 0.7–4.0)
MCV: 96.7 fL (ref 78.0–100.0)
Monocytes Absolute: 0.2 10*3/uL (ref 0.1–1.0)
Monocytes Relative: 4 % (ref 3–12)
Neutro Abs: 1.8 10*3/uL (ref 1.7–7.7)
RBC: 3.38 MIL/uL — ABNORMAL LOW (ref 3.87–5.11)
WBC: 3.6 10*3/uL — ABNORMAL LOW (ref 4.0–10.5)

## 2010-10-21 LAB — COMPLETE METABOLIC PANEL WITH GFR
AST: 62 U/L — ABNORMAL HIGH (ref 0–37)
Albumin: 3.6 g/dL (ref 3.5–5.2)
BUN: 18 mg/dL (ref 6–23)
CO2: 25 mEq/L (ref 19–32)
Calcium: 8.6 mg/dL (ref 8.4–10.5)
Chloride: 108 mEq/L (ref 96–112)
GFR, Est African American: >60 mL/min (ref >60)
Glucose, Bld: 91 mg/dL (ref 70–99)
Potassium: 4.1 mEq/L (ref 3.5–5.3)

## 2010-10-21 LAB — URINALYSIS
Glucose, UA: NEGATIVE mg/dL
Hgb urine dipstick: NEGATIVE
Ketones, ur: NEGATIVE mg/dL
pH: 7 (ref 5.0–8.0)

## 2010-10-21 LAB — GC/CHLAMYDIA PROBE AMP, URINE: GC Probe Amp, Urine: NEGATIVE

## 2010-10-21 LAB — HEPATITIS A ANTIBODY, TOTAL: Hep A Total Ab: POSITIVE — AB

## 2010-10-21 LAB — HIV-1 RNA QUANT-NO REFLEX-BLD: HIV 1 RNA Quant: <20 copies/mL (ref <20)

## 2010-10-25 ENCOUNTER — Other Ambulatory Visit: Payer: Self-pay | Admitting: Infectious Diseases

## 2010-10-25 DIAGNOSIS — B20 Human immunodeficiency virus [HIV] disease: Secondary | ICD-10-CM

## 2010-10-27 ENCOUNTER — Other Ambulatory Visit: Payer: Self-pay | Admitting: Infectious Diseases

## 2010-10-27 DIAGNOSIS — B20 Human immunodeficiency virus [HIV] disease: Secondary | ICD-10-CM

## 2010-11-03 ENCOUNTER — Ambulatory Visit: Payer: Self-pay

## 2010-11-03 ENCOUNTER — Ambulatory Visit: Payer: Self-pay | Admitting: Adult Health

## 2010-11-03 ENCOUNTER — Ambulatory Visit (INDEPENDENT_AMBULATORY_CARE_PROVIDER_SITE_OTHER): Payer: Self-pay | Admitting: Adult Health

## 2010-11-03 ENCOUNTER — Encounter: Payer: Self-pay | Admitting: Adult Health

## 2010-11-03 DIAGNOSIS — Z23 Encounter for immunization: Secondary | ICD-10-CM

## 2010-11-03 DIAGNOSIS — R7401 Elevation of levels of liver transaminase levels: Secondary | ICD-10-CM | POA: Insufficient documentation

## 2010-11-03 DIAGNOSIS — B20 Human immunodeficiency virus [HIV] disease: Secondary | ICD-10-CM

## 2010-11-03 DIAGNOSIS — R7402 Elevation of levels of lactic acid dehydrogenase (LDH): Secondary | ICD-10-CM

## 2010-11-13 ENCOUNTER — Ambulatory Visit: Payer: Self-pay

## 2010-11-21 ENCOUNTER — Telehealth: Payer: Self-pay | Admitting: *Deleted

## 2010-11-21 NOTE — Telephone Encounter (Signed)
Patient called stating that Vicki Brown wanted her to let him know when she got her discount card because he wants to run some "tests" on her. She now has the card. Wendall Mola CMA

## 2010-11-26 ENCOUNTER — Ambulatory Visit (INDEPENDENT_AMBULATORY_CARE_PROVIDER_SITE_OTHER): Payer: Self-pay | Admitting: Adult Health

## 2010-11-26 ENCOUNTER — Encounter: Payer: Self-pay | Admitting: Adult Health

## 2010-11-26 VITALS — BP 118/84 | HR 65 | Temp 98.6°F | Ht 67.0 in | Wt 161.0 lb

## 2010-11-26 DIAGNOSIS — R7989 Other specified abnormal findings of blood chemistry: Secondary | ICD-10-CM

## 2011-02-11 ENCOUNTER — Other Ambulatory Visit: Payer: Self-pay | Admitting: Infectious Diseases

## 2011-02-11 ENCOUNTER — Other Ambulatory Visit: Payer: Self-pay

## 2011-02-11 ENCOUNTER — Other Ambulatory Visit: Payer: Self-pay | Admitting: Licensed Clinical Social Worker

## 2011-02-11 DIAGNOSIS — Z113 Encounter for screening for infections with a predominantly sexual mode of transmission: Secondary | ICD-10-CM

## 2011-02-11 DIAGNOSIS — B20 Human immunodeficiency virus [HIV] disease: Secondary | ICD-10-CM

## 2011-02-12 LAB — CBC WITH DIFFERENTIAL/PLATELET
Basophils Absolute: 0 10*3/uL (ref 0.0–0.1)
Basophils Relative: 0 % (ref 0–1)
MCHC: 32.5 g/dL (ref 30.0–36.0)
Monocytes Absolute: 0.2 10*3/uL (ref 0.1–1.0)
Neutro Abs: 1.7 10*3/uL (ref 1.7–7.7)
Neutrophils Relative %: 47 % (ref 43–77)
RDW: 13.3 % (ref 11.5–15.5)

## 2011-02-12 LAB — COMPLETE METABOLIC PANEL WITH GFR
AST: 53 U/L — ABNORMAL HIGH (ref 0–37)
Albumin: 3.9 g/dL (ref 3.5–5.2)
Alkaline Phosphatase: 186 U/L — ABNORMAL HIGH (ref 39–117)
BUN: 12 mg/dL (ref 6–23)
Potassium: 4 mEq/L (ref 3.5–5.3)
Sodium: 141 mEq/L (ref 135–145)
Total Bilirubin: 0.2 mg/dL — ABNORMAL LOW (ref 0.3–1.2)

## 2011-02-12 LAB — URINALYSIS, ROUTINE W REFLEX MICROSCOPIC
Hgb urine dipstick: NEGATIVE
Leukocytes, UA: NEGATIVE
Nitrite: NEGATIVE
Protein, ur: NEGATIVE mg/dL

## 2011-02-12 LAB — GC/CHLAMYDIA PROBE AMP, URINE: GC Probe Amp, Urine: NEGATIVE

## 2011-02-12 LAB — T-HELPER CELL (CD4) - (RCID CLINIC ONLY)
CD4 % Helper T Cell: 57 % — ABNORMAL HIGH (ref 33–55)
CD4 T Cell Abs: 1000 uL (ref 400–2700)

## 2011-02-18 LAB — HIV-1 RNA QUANT-NO REFLEX-BLD: HIV 1 RNA Quant: <20 copies/mL (ref <20)

## 2011-02-25 ENCOUNTER — Ambulatory Visit: Payer: Self-pay | Admitting: Infectious Diseases

## 2011-02-25 ENCOUNTER — Telehealth: Payer: Self-pay | Admitting: *Deleted

## 2011-02-25 NOTE — Telephone Encounter (Signed)
I left a message asking her to call back & reschedule

## 2011-03-16 ENCOUNTER — Encounter: Payer: Self-pay | Admitting: Infectious Diseases

## 2011-03-16 ENCOUNTER — Ambulatory Visit (INDEPENDENT_AMBULATORY_CARE_PROVIDER_SITE_OTHER): Payer: Self-pay | Admitting: Infectious Diseases

## 2011-03-16 ENCOUNTER — Ambulatory Visit: Payer: Self-pay

## 2011-03-16 DIAGNOSIS — Z79899 Other long term (current) drug therapy: Secondary | ICD-10-CM

## 2011-03-16 DIAGNOSIS — R7401 Elevation of levels of liver transaminase levels: Secondary | ICD-10-CM

## 2011-03-16 DIAGNOSIS — I10 Essential (primary) hypertension: Secondary | ICD-10-CM

## 2011-03-16 DIAGNOSIS — Z113 Encounter for screening for infections with a predominantly sexual mode of transmission: Secondary | ICD-10-CM

## 2011-03-16 DIAGNOSIS — B20 Human immunodeficiency virus [HIV] disease: Secondary | ICD-10-CM

## 2011-03-16 NOTE — Assessment & Plan Note (Signed)
Well controlled today. Will recheck her lipids at her f/u visit.

## 2011-03-16 NOTE — Assessment & Plan Note (Signed)
The etiology of this is unclear (HIV, ART?). Hep C/B (-).  Appears to be well tolerated. Will continue to watch.

## 2011-03-16 NOTE — Assessment & Plan Note (Signed)
She is doing very well. Will see her back in 6 months, will need repeat PAP at that time. Repeat labs prior to visit. She is given condoms today.

## 2011-03-16 NOTE — Progress Notes (Signed)
  Subjective:    Patient ID: Vicki Brown, female    DOB: [birth year], 51 y.o.   MRN: [id number]  HPI 51 yo F with hx of HIV+ since 2009. Maintained on atripla. Has had mildly increased LFTs over the last 12 months (AST 53, ALT 40, Alk P 186 02-11-11). Last CD4 1000, VL <20 (02-11-11). Pap (08-15-10) NL.   No problems with Atripla. Been working hard.    Review of Systems  Constitutional: Negative for fever, chills, appetite change and unexpected weight change.  Gastrointestinal: Positive for constipation. Negative for diarrhea.  Genitourinary: Negative for difficulty urinating.  Neurological: Negative for headaches.       Objective:   Physical Exam  Constitutional: She appears well-developed and well-nourished.  Eyes: EOM are normal. Pupils are equal, round, and reactive to light.  Neck: Neck supple.  Cardiovascular: Normal rate, regular rhythm and normal heart sounds.   Pulmonary/Chest: Effort normal and breath sounds normal. No respiratory distress. She has no wheezes. She has no rales.  Abdominal: Soft. Bowel sounds are normal. There is no tenderness.  Lymphadenopathy:    She has no cervical adenopathy.          Assessment & Plan:

## 2011-04-03 NOTE — Progress Notes (Signed)
Patient ID: Vicki Brown, female   DOB: 08-22-1960, 51 y.o.   MRN: [id number] Liborio Nixon return to clinic today stating that she has [facility] health care. Plan and was wondering if we should proceed with diagnostic studies. Related to her elevated liver function tests. I informed her that our plan originally was to continue to monitor her LFTs before we made. Any further diagnostic studies, but obtaining an orange card would always be in her best interest considering her HIV status. Our plan for right now, would be to observe her labs on repeat blood draw in 10 weeks with a followup in 3 months with a clinic. Provider. She was informed that although her LFTs were elevated, they were not critically elevated. She verbally acknowledged all information that was provided to her and agreed with plan of care.  Joci Dress A. Sundra Aland, MS, [facility] [phone]  04/03/2011, 11:43 AM

## 2011-04-14 NOTE — Assessment & Plan Note (Signed)
Hepatitis serologies are negative except for hepatitis A, which is antibody positive. Clinically, she has not have any complaints. Question is whether or not this is associated with ARV therapy. We'll monitor her these tests for the time being unless she becomes symptomatic.

## 2011-04-14 NOTE — Progress Notes (Signed)
Subjective:    Patient ID: Vicki Brown is a 51 y.o. female.  Chief Complaint: HIV Follow-up Visit TYNEKA SCAFIDI is here for follow-up of HIV infection. She is feeling unchanged since her last visit.  She claims continued adherence to therapy with good tolerance and no complications. There are not additional complaints.   Data Review: Diagnostic studies reviewed.  Review of Systems - General ROS: negative for - fatigue, hot flashes or malaise Psychological ROS: negative for - anxiety, behavioral disorder, concentration difficulties, depression or mood swings Ophthalmic ROS: negative ENT ROS: negative Breast ROS: negative for breast lumps Respiratory ROS: no cough, shortness of breath, or wheezing Cardiovascular ROS: no chest pain or dyspnea on exertion Gastrointestinal ROS: no abdominal pain, change in bowel habits, or black or bloody stools Musculoskeletal ROS: negative Neurological ROS: no TIA or stroke symptoms Dermatological ROS: negative for rash and skin lesion changes  Objective:   General appearance: alert and cooperative Head: Normocephalic, without obvious abnormality, atraumatic Eyes: conjunctivae/corneas clear. PERRL, EOM's intact. Fundi benign. Ears: normal TM's and external ear canals both ears Nose: Nares normal. Septum midline. Mucosa normal. No drainage or sinus tenderness. Throat: lips, mucosa, and tongue normal; teeth and gums normal Resp: clear to auscultation bilaterally Cardio: regular rate and rhythm, S1, S2 normal, no murmur, click, rub or gallop GI: soft, non-tender; bowel sounds normal; no masses,  no organomegaly Extremities: extremities normal, atraumatic, no cyanosis or edema Skin: Skin color, texture, turgor normal. No rashes or lesions Neurologic: Alert and oriented X 3, normal strength and tone. Normal symmetric reflexes. Normal coordination and gait Psych:  No vegetative signs or delusional behaviors noted.    Laboratory: From 10/20/2010 ,   CD4 count was 890 c/cmm @ 54 %. Viral load <20 copies/ml.  Serum alkaline phosphatase 242 units per liter, AST 62, ALT 62   Assessment/Plan:   HIV DISEASE Clinically stable on current regimen. Continue present management.  Counseling provided on prevention of transmission of HIV. Condoms offered:  Yes Medication adherence discussed with patient.  Follow up visit in 14 weeks with labs 2 weeks prior to appointment. Patient verbally acknowledged information provided to them and agreed with plan of care.   Transaminitis Hepatitis serologies are negative except for hepatitis A, which is antibody positive. Clinically, she has not have any complaints. Question is whether or not this is associated with ARV therapy. We'll monitor her these tests for the time being unless she becomes symptomatic.     Zeki Bedrosian A. Sundra Aland, MS, [facility] [phone]  04/14/2011, 2:30 PM

## 2011-04-14 NOTE — Assessment & Plan Note (Signed)
Clinically stable on current regimen. Continue present management.  Counseling provided on prevention of transmission of HIV. Condoms offered:  Yes Medication adherence discussed with patient.  Follow up visit in 14 weeks with labs 2 weeks prior to appointment. Patient verbally acknowledged information provided to them and agreed with plan of care.

## 2011-04-21 DIAGNOSIS — S39012A Strain of muscle, fascia and tendon of lower back, initial encounter: Secondary | ICD-10-CM | POA: Insufficient documentation

## 2011-04-21 NOTE — Progress Notes (Signed)
Subjective:  Presents with lower back pain that is nonradiating and no associated radiculopathy. Symptoms. Relates that she sometimes has to pick up objects or over her head or on the floor. Denies any known trauma or injury. Pain is described as aching in nature, 7/10 intensity.  Review of Systems - Musculoskeletal ROS: positive for - pain in back - bilateral   Objective:  Range of motion to lower back, limited on forward flexion and hyperextension, but normal on side to side. SLRs approximately 45-50 bilaterally without assistance. Able to tiptoe and heel walk across a room. No paresthesia or dysesthesia assessed.   Assessment/Plan:  Lumbosacral strain Proper body mechanics, was discussed. Use of a lumbosacral support. Alternating hot and cold packs to lower back. Ibuprofen 800 mg Q8H when necessary. To contact clinic in 3-5 days. If symptoms do not improve or worsen.      Kiondre Grenz A. Sundra Aland, MS, [facility] [phone]  04/21/2011,  10:47 AM

## 2011-04-21 NOTE — Assessment & Plan Note (Signed)
Proper body mechanics, was discussed. Use of a lumbosacral support. Alternating hot and cold packs to lower back. Ibuprofen 800 mg Q8H when necessary. To contact clinic in 3-5 days. If symptoms do not improve or worsen.

## 2011-06-24 ENCOUNTER — Emergency Department (HOSPITAL_COMMUNITY): Payer: Self-pay

## 2011-06-24 ENCOUNTER — Encounter (HOSPITAL_COMMUNITY): Payer: Self-pay | Admitting: *Deleted

## 2011-06-24 ENCOUNTER — Emergency Department (HOSPITAL_COMMUNITY)
Admission: EM | Admit: 2011-06-24 | Discharge: 2011-06-24 | Disposition: A | Discharge status: Home / Self Care | Payer: Self-pay | Attending: Emergency Medicine | Admitting: Emergency Medicine

## 2011-06-24 DIAGNOSIS — Z21 Asymptomatic human immunodeficiency virus [HIV] infection status: Secondary | ICD-10-CM | POA: Insufficient documentation

## 2011-06-24 DIAGNOSIS — M545 Low back pain, unspecified: Secondary | ICD-10-CM | POA: Insufficient documentation

## 2011-06-24 DIAGNOSIS — I10 Essential (primary) hypertension: Secondary | ICD-10-CM | POA: Insufficient documentation

## 2011-06-24 DIAGNOSIS — Z79899 Other long term (current) drug therapy: Secondary | ICD-10-CM | POA: Insufficient documentation

## 2011-06-24 DIAGNOSIS — M542 Cervicalgia: Secondary | ICD-10-CM | POA: Insufficient documentation

## 2011-06-24 DIAGNOSIS — IMO0001 Reserved for inherently not codable concepts without codable children: Secondary | ICD-10-CM | POA: Insufficient documentation

## 2011-06-24 DIAGNOSIS — M25519 Pain in unspecified shoulder: Secondary | ICD-10-CM | POA: Insufficient documentation

## 2011-06-24 MED ORDER — LORAZEPAM 1 MG PO TABS: 1.0000 mg | ORAL_TABLET | Freq: Three times a day (TID) | ORAL | Status: AC | PRN

## 2011-06-24 MED ORDER — LORAZEPAM 1 MG PO TABS
1.0000 mg | ORAL_TABLET | Freq: Once | ORAL | Status: AC
  Administered 2011-06-24: 1 mg via ORAL
  Filled 2011-06-24: qty 1

## 2011-06-24 MED ORDER — HYDROCODONE-ACETAMINOPHEN 5-325 MG PO TABS
1.0000 | ORAL_TABLET | Freq: Once | ORAL | Status: AC
  Administered 2011-06-24: 1 via ORAL
  Filled 2011-06-24: qty 1

## 2011-06-24 MED ORDER — IBUPROFEN 800 MG PO TABS: 800.0000 mg | ORAL_TABLET | Freq: Three times a day (TID) | ORAL | Status: DC | PRN

## 2011-06-24 MED ORDER — HYDROCODONE-ACETAMINOPHEN 5-325 MG PO TABS: 1.0000 | ORAL_TABLET | Freq: Four times a day (QID) | ORAL | Status: AC | PRN
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
## 2011-06-24 NOTE — ED Provider Notes (Signed)
Medical screening examination/treatment/procedure(s) were performed by non-physician practitioner and as supervising physician I was immediately available for consultation/collaboration.   Loren Racer, MD 06/24/11 2110
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
## 2011-06-25 ENCOUNTER — Telehealth: Payer: Self-pay | Admitting: *Deleted
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
## 2011-06-26 ENCOUNTER — Encounter: Payer: Self-pay | Admitting: Internal Medicine

## 2011-06-26 ENCOUNTER — Ambulatory Visit (INDEPENDENT_AMBULATORY_CARE_PROVIDER_SITE_OTHER): Payer: Self-pay | Admitting: Internal Medicine

## 2011-06-26 ENCOUNTER — Encounter: Payer: Self-pay | Admitting: *Deleted

## 2011-06-26 VITALS — BP 121/85 | HR 72 | Temp 98.5°F | Ht 66.0 in | Wt 157.0 lb

## 2011-06-26 DIAGNOSIS — O98519 Other viral diseases complicating pregnancy, unspecified trimester: Secondary | ICD-10-CM

## 2011-06-26 DIAGNOSIS — Z113 Encounter for screening for infections with a predominantly sexual mode of transmission: Secondary | ICD-10-CM

## 2011-06-26 DIAGNOSIS — T148XXA Other injury of unspecified body region, initial encounter: Secondary | ICD-10-CM

## 2011-06-26 DIAGNOSIS — Z79899 Other long term (current) drug therapy: Secondary | ICD-10-CM

## 2011-06-26 DIAGNOSIS — B20 Human immunodeficiency virus [HIV] disease: Secondary | ICD-10-CM

## 2011-06-26 DIAGNOSIS — Z21 Asymptomatic human immunodeficiency virus [HIV] infection status: Secondary | ICD-10-CM

## 2011-06-26 LAB — LIPID PANEL
HDL: 81 mg/dL (ref >39)
LDL Cholesterol: 126 mg/dL — ABNORMAL HIGH (ref 0–99)
Total CHOL/HDL Ratio: 2.7 Ratio
VLDL: 11 mg/dL (ref 0–40)

## 2011-06-26 LAB — URINALYSIS
Hgb urine dipstick: NEGATIVE
Ketones, ur: NEGATIVE mg/dL
Leukocytes, UA: NEGATIVE
Nitrite: NEGATIVE
Specific Gravity, Urine: 1.011 (ref 1.005–1.030)
Urobilinogen, UA: 0.2 mg/dL (ref 0.0–1.0)

## 2011-06-26 LAB — CBC WITH DIFFERENTIAL/PLATELET
Basophils Absolute: 0 10*3/uL (ref 0.0–0.1)
Basophils Relative: 0 % (ref 0–1)
Eosinophils Absolute: 0 10*3/uL (ref 0.0–0.7)
MCH: 32.1 pg (ref 26.0–34.0)
MCHC: 33.5 g/dL (ref 30.0–36.0)
Neutro Abs: 1.4 10*3/uL — ABNORMAL LOW (ref 1.7–7.7)
Neutrophils Relative %: 46 % (ref 43–77)
Platelets: 270 10*3/uL (ref 150–400)

## 2011-06-26 LAB — COMPREHENSIVE METABOLIC PANEL
AST: 61 U/L — ABNORMAL HIGH (ref 0–37)
Alkaline Phosphatase: 177 U/L — ABNORMAL HIGH (ref 39–117)
BUN: 8 mg/dL (ref 6–23)
Calcium: 9.4 mg/dL (ref 8.4–10.5)
Creat: 0.86 mg/dL (ref 0.50–1.10)
Total Bilirubin: 0.3 mg/dL (ref 0.3–1.2)

## 2011-06-26 LAB — T-HELPER CELL (CD4) - (RCID CLINIC ONLY): CD4 T Cell Abs: 920 uL (ref 400–2700)

## 2011-06-26 MED ORDER — CYCLOBENZAPRINE HCL 5 MG PO TABS: 5.0000 mg | ORAL_TABLET | Freq: Three times a day (TID) | ORAL | Status: DC | PRN
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
## 2011-06-30 LAB — HIV-1 RNA QUANT-NO REFLEX-BLD
HIV 1 RNA Quant: <20 copies/mL (ref <20)
HIV-1 RNA Quant, Log: <1.3 {Log} (ref <1.30)

## 2011-07-02 ENCOUNTER — Other Ambulatory Visit: Payer: Self-pay | Admitting: *Deleted

## 2011-07-02 DIAGNOSIS — I10 Essential (primary) hypertension: Secondary | ICD-10-CM

## 2011-07-02 MED ORDER — LISINOPRIL-HYDROCHLOROTHIAZIDE 10-12.5 MG PO TABS: 1.0000 | ORAL_TABLET | Freq: Every day | ORAL | Status: DC

## 2011-07-31 ENCOUNTER — Ambulatory Visit (INDEPENDENT_AMBULATORY_CARE_PROVIDER_SITE_OTHER): Payer: Self-pay | Admitting: *Deleted

## 2011-07-31 ENCOUNTER — Encounter: Payer: Self-pay | Admitting: *Deleted

## 2011-07-31 DIAGNOSIS — Z124 Encounter for screening for malignant neoplasm of cervix: Secondary | ICD-10-CM

## 2011-07-31 DIAGNOSIS — Z1231 Encounter for screening mammogram for malignant neoplasm of breast: Secondary | ICD-10-CM

## 2011-07-31 DIAGNOSIS — Z0000 Encounter for general adult medical examination without abnormal findings: Secondary | ICD-10-CM

## 2011-07-31 NOTE — Progress Notes (Signed)
  Subjective:     Vicki Brown is a 51 y.o. woman who comes in today for a  pap smear only.  Contraception:  Condoms  Objective:    There were no vitals taken for this visit.   Pelvic Exam:  Pap smear obtained.  1 cm oval cyst noted on left labia majora.  Non-tender, non-draining, moveable cyst that is the same color as the pt's surrounding labial tissue.    Assessment:    Screening pap smear.   Plan:    Follow up in one year, or as indicated by Pap results.  Pt completed Mammogram Scholarship Application.  Given condoms.  Pt given educational materials re:  HIV and women, heart disease, nutrition, diet, exercise, BSE, PAP smears, self-esteem and ACA.

## 2011-08-04 ENCOUNTER — Other Ambulatory Visit: Payer: Self-pay

## 2011-08-06 ENCOUNTER — Encounter: Payer: Self-pay | Admitting: *Deleted

## 2011-08-18 ENCOUNTER — Ambulatory Visit: Payer: Self-pay | Admitting: Infectious Diseases

## 2011-08-28 ENCOUNTER — Ambulatory Visit (HOSPITAL_COMMUNITY)
Admission: RE | Admit: 2011-08-28 | Discharge: 2011-08-28 | Disposition: A | Discharge status: Home / Self Care | Payer: Self-pay | Source: Ambulatory Visit | Attending: Infectious Diseases | Admitting: Infectious Diseases

## 2011-08-28 DIAGNOSIS — Z1231 Encounter for screening mammogram for malignant neoplasm of breast: Secondary | ICD-10-CM

## 2011-09-14 ENCOUNTER — Other Ambulatory Visit: Payer: Self-pay

## 2011-09-14 DIAGNOSIS — B20 Human immunodeficiency virus [HIV] disease: Secondary | ICD-10-CM

## 2011-09-14 DIAGNOSIS — Z113 Encounter for screening for infections with a predominantly sexual mode of transmission: Secondary | ICD-10-CM

## 2011-09-14 DIAGNOSIS — Z79899 Other long term (current) drug therapy: Secondary | ICD-10-CM

## 2011-09-14 LAB — LIPID PANEL
Cholesterol: 199 mg/dL (ref 0–200)
HDL: 71 mg/dL (ref >39)
Total CHOL/HDL Ratio: 2.8 Ratio

## 2011-09-14 LAB — CBC
HCT: 35.2 % — ABNORMAL LOW (ref 36.0–46.0)
MCH: 31.7 pg (ref 26.0–34.0)
MCV: 96.2 fL (ref 78.0–100.0)
RDW: 13.7 % (ref 11.5–15.5)
WBC: 4.3 10*3/uL (ref 4.0–10.5)

## 2011-09-14 LAB — COMPREHENSIVE METABOLIC PANEL
AST: 98 U/L — ABNORMAL HIGH (ref 0–37)
BUN: 11 mg/dL (ref 6–23)
Calcium: 9 mg/dL (ref 8.4–10.5)
Chloride: 105 mEq/L (ref 96–112)
Creat: 0.81 mg/dL (ref 0.50–1.10)

## 2011-09-28 ENCOUNTER — Ambulatory Visit: Payer: Self-pay | Admitting: Infectious Diseases

## 2011-10-21 ENCOUNTER — Other Ambulatory Visit: Payer: Self-pay | Admitting: Infectious Diseases

## 2011-10-23 ENCOUNTER — Other Ambulatory Visit: Payer: Self-pay | Admitting: Infectious Diseases

## 2011-11-02 ENCOUNTER — Other Ambulatory Visit: Payer: Self-pay | Admitting: *Deleted

## 2011-11-02 DIAGNOSIS — B20 Human immunodeficiency virus [HIV] disease: Secondary | ICD-10-CM

## 2011-11-02 MED ORDER — EFAVIRENZ-EMTRICITAB-TENOFOVIR 600-200-300 MG PO TABS: 1.0000 | ORAL_TABLET | Freq: Every day | ORAL | Status: DC

## 2011-11-04 ENCOUNTER — Ambulatory Visit: Payer: Self-pay

## 2011-11-06 ENCOUNTER — Ambulatory Visit: Payer: Self-pay

## 2011-11-17 ENCOUNTER — Ambulatory Visit: Payer: Self-pay

## 2011-12-09 ENCOUNTER — Ambulatory Visit: Payer: Self-pay | Admitting: Infectious Diseases

## 2011-12-14 ENCOUNTER — Encounter: Payer: Self-pay | Admitting: Infectious Diseases

## 2011-12-14 ENCOUNTER — Ambulatory Visit (INDEPENDENT_AMBULATORY_CARE_PROVIDER_SITE_OTHER): Payer: Self-pay | Admitting: Infectious Diseases

## 2011-12-14 VITALS — BP 111/74 | HR 65 | Temp 98.3°F | Ht 66.0 in | Wt 155.0 lb

## 2011-12-14 DIAGNOSIS — T148XXA Other injury of unspecified body region, initial encounter: Secondary | ICD-10-CM

## 2011-12-14 DIAGNOSIS — B20 Human immunodeficiency virus [HIV] disease: Secondary | ICD-10-CM

## 2011-12-14 DIAGNOSIS — Z21 Asymptomatic human immunodeficiency virus [HIV] infection status: Secondary | ICD-10-CM

## 2011-12-14 DIAGNOSIS — Z79899 Other long term (current) drug therapy: Secondary | ICD-10-CM

## 2011-12-14 DIAGNOSIS — Z113 Encounter for screening for infections with a predominantly sexual mode of transmission: Secondary | ICD-10-CM

## 2011-12-14 NOTE — Assessment & Plan Note (Signed)
She is doing very well. Her labs are explained to her. She had normal pap, normal Mammo.  She refuses flu shot. No change in meds. She is given condoms. Will see her back in 6 months.

## 2011-12-14 NOTE — Assessment & Plan Note (Signed)
Most likely cause of her shoulder pain. She does not have rash to suggest zoster (although pain is dermatomal? Hyperesthetic? Not on my exam).

## 2011-12-14 NOTE — Progress Notes (Signed)
  Subjective:    Patient ID: Vicki Brown, female    DOB: 05-13-1960, 51 y.o.   MRN: [id number]  HPI 51 yo F with HIV+ since 05-2007, on atripla.   HIV 1 RNA Quant (copies/mL)  Date Value  09/14/2011 <20   06/26/2011 <20   02/11/2011 <20      CD4 T Cell Abs (cmm)  Date Value  09/14/2011 1000   06/26/2011 920   02/11/2011 1000    Without complaints. Recently had R shoulder pain. Was hypersensitive as well.    Review of Systems  Constitutional: Negative for appetite change and unexpected weight change.  Cardiovascular: Negative for chest pain.  Gastrointestinal: Negative for diarrhea and constipation.  Genitourinary: Negative for dysuria.  Musculoskeletal: Positive for back pain.  Neurological: Negative for headaches.       Objective:   Physical Exam  Constitutional: She appears well-developed and well-nourished.  HENT:  Mouth/Throat: No oropharyngeal exudate.  Eyes: EOM are normal. Pupils are equal, round, and reactive to light.  Neck: Neck supple.  Cardiovascular: Normal rate, regular rhythm and normal heart sounds.   Pulmonary/Chest: Effort normal and breath sounds normal.  Abdominal: Soft. Bowel sounds are normal. There is no tenderness.  Lymphadenopathy:    She has no cervical adenopathy.  Skin: Skin is warm and dry. No rash noted.          Assessment & Plan:

## 2011-12-27 ENCOUNTER — Emergency Department (HOSPITAL_COMMUNITY)
Admission: EM | Admit: 2011-12-27 | Discharge: 2011-12-27 | Disposition: A | Discharge status: Home / Self Care | Payer: Self-pay | Attending: Emergency Medicine | Admitting: Emergency Medicine

## 2011-12-27 ENCOUNTER — Encounter (HOSPITAL_COMMUNITY): Payer: Self-pay | Admitting: Nurse Practitioner

## 2011-12-27 DIAGNOSIS — J111 Influenza due to unidentified influenza virus with other respiratory manifestations: Secondary | ICD-10-CM | POA: Insufficient documentation

## 2011-12-27 DIAGNOSIS — I10 Essential (primary) hypertension: Secondary | ICD-10-CM | POA: Insufficient documentation

## 2011-12-27 DIAGNOSIS — Z21 Asymptomatic human immunodeficiency virus [HIV] infection status: Secondary | ICD-10-CM | POA: Insufficient documentation

## 2011-12-27 DIAGNOSIS — Z87891 Personal history of nicotine dependence: Secondary | ICD-10-CM | POA: Insufficient documentation

## 2011-12-27 DIAGNOSIS — R6889 Other general symptoms and signs: Secondary | ICD-10-CM

## 2011-12-27 MED ORDER — OSELTAMIVIR PHOSPHATE 75 MG PO CAPS: 75.0000 mg | ORAL_CAPSULE | Freq: Two times a day (BID) | ORAL | Status: DC

## 2011-12-27 NOTE — ED Notes (Signed)
C/o body aches since yesterday. Taking tylenol with some relief. No other complaints.

## 2011-12-27 NOTE — ED Provider Notes (Signed)
History   This chart was scribed for Vicki Booze, MD by Sofie Rower. The patient was seen in room TR04C/TR04C and the patient's care was started at 1:30PM    CSN: [id number]  Arrival date & time 12/27/11  1158   First MD Initiated Contact with Patient 12/27/11 1330      Chief Complaint  Patient presents with  . Generalized Body Aches    (Consider location/radiation/quality/duration/timing/severity/associated sxs/prior treatment) Patient is a 51 y.o. female presenting with musculoskeletal pain. The history is provided by the patient. No language interpreter was used.  Muscle Pain This is a new problem. The current episode started yesterday. The problem occurs constantly. The problem has been gradually worsening. Pertinent negatives include no chest pain and no abdominal pain. Nothing aggravates the symptoms. Nothing relieves the symptoms. She has tried acetaminophen for the symptoms. The treatment provided moderate relief.    TOM RAGSDALE is a 51 y.o. female who presents to the Emergency Department complaining of   sudden, progressively worsening, myalgias, onset yesterday (12/26/11).  Associated symptoms include nonproductive cough, fever (unmeausred at home, onset 2 days ago), and chills. The pt reports she recently decided to turn down her flu shot and began to experience muscle aches yesterday evening. The pt rates her muscle pain at a 7/10 at present. The pt informs her CD4 counts were at 1000, one week ago. Modifying factors include taking tylenol which provides moderate relief of myalgias. The pt has a hx of hypertension and HIV infection.   The pt denies sweats and rhinorrhea associated with the myalgias.    The pt does not smoke, however, she does drink alcohol on rare occasion.   Infection specialist is Dr. Pilar Jarvis.    Past Medical History  Diagnosis Date  . HIV infection   . Hypertension     History reviewed. No pertinent past surgical history.  Family History    Problem Relation Age of Onset  . Cancer Mother     uterine vs ovvarian?  . Seizures Father   . Mental illness Brother   . Cancer Maternal Aunt     breast CA    History  Substance Use Topics  . Smoking status: Former Smoker    Types: Cigarettes  . Smokeless tobacco: Never Used  . Alcohol Use: Yes     rarely    OB History    Grav Para Term Preterm Abortions TAB SAB Ect Mult Living                  Review of Systems  Cardiovascular: Negative for chest pain.  Gastrointestinal: Negative for abdominal pain.  All other systems reviewed and are negative.    Allergies  Review of patient's allergies indicates no known allergies.  Home Medications   Current Outpatient Rx  Name Route Sig Dispense Refill  . EFAVIRENZ-EMTRICITAB-TENOFOVIR 600-200-300 MG PO TABS Oral Take 1 tablet by mouth at bedtime. 30 tablet 6  . IBUPROFEN 800 MG PO TABS Oral Take 800 mg by mouth every 8 (eight) hours as needed. For pain    . LISINOPRIL-HYDROCHLOROTHIAZIDE 10-12.5 MG PO TABS Oral Take 1 tablet by mouth daily. 30 tablet 11    BP 135/87  Pulse 60  Temp 97.6 F (36.4 C) (Oral)  Resp 20  SpO2 100%  Physical Exam  Nursing note and vitals reviewed. Constitutional: She is oriented to person, place, and time. She appears well-developed and well-nourished.  HENT:  Head: Atraumatic.  Nose: Nose normal.  Mouth/Throat: Oropharynx is  clear and moist.  Eyes: Conjunctivae normal are normal.  Neck: Normal range of motion.  Cardiovascular: Normal rate, regular rhythm and normal heart sounds.   Pulmonary/Chest: Effort normal and breath sounds normal.  Abdominal: Soft. Bowel sounds are normal.  Musculoskeletal: Normal range of motion.  Neurological: She is alert and oriented to person, place, and time.  Skin: Skin is warm and dry.  Psychiatric: She has a normal mood and affect. Her behavior is normal.    ED Course  Procedures (including critical care time)  DIAGNOSTIC STUDIES: Oxygen  Saturation is 100% on room air, normal by my interpretation.    COORDINATION OF CARE:   1:37 PM- Treatment plan concerning management of possible flu virus discussed with patient. Pt agrees with treatment.     1. Flu-like symptoms       MDM  Flulike illness in patient with HIV infection and has not received flu vaccine. She'll be treated empirically for influenza with oseltamivir and advised to get her flu immunization once she is over this illness.      I personally performed the services described in this documentation, which was scribed in my presence. The recorded information has been reviewed and considered.      Vicki Booze, MD 12/27/11 [phone]

## 2012-01-01 ENCOUNTER — Ambulatory Visit: Payer: Self-pay

## 2012-01-04 ENCOUNTER — Ambulatory Visit (INDEPENDENT_AMBULATORY_CARE_PROVIDER_SITE_OTHER): Payer: Self-pay

## 2012-01-04 DIAGNOSIS — Z23 Encounter for immunization: Secondary | ICD-10-CM

## 2012-03-17 DIAGNOSIS — Z113 Encounter for screening for infections with a predominantly sexual mode of transmission: Secondary | ICD-10-CM

## 2012-03-20 ENCOUNTER — Emergency Department (HOSPITAL_COMMUNITY)
Admission: EM | Admit: 2012-03-20 | Discharge: 2012-03-20 | Disposition: A | Discharge status: Home / Self Care | Payer: No Typology Code available for payment source | Attending: Emergency Medicine | Admitting: Emergency Medicine

## 2012-03-20 ENCOUNTER — Encounter (HOSPITAL_COMMUNITY): Payer: Self-pay | Admitting: *Deleted

## 2012-03-20 DIAGNOSIS — L299 Pruritus, unspecified: Secondary | ICD-10-CM | POA: Insufficient documentation

## 2012-03-20 DIAGNOSIS — Z87891 Personal history of nicotine dependence: Secondary | ICD-10-CM | POA: Insufficient documentation

## 2012-03-20 DIAGNOSIS — F1210 Cannabis abuse, uncomplicated: Secondary | ICD-10-CM | POA: Insufficient documentation

## 2012-03-20 DIAGNOSIS — B20 Human immunodeficiency virus [HIV] disease: Secondary | ICD-10-CM | POA: Insufficient documentation

## 2012-03-20 DIAGNOSIS — B354 Tinea corporis: Secondary | ICD-10-CM | POA: Insufficient documentation

## 2012-03-20 DIAGNOSIS — I10 Essential (primary) hypertension: Secondary | ICD-10-CM | POA: Insufficient documentation

## 2012-03-20 DIAGNOSIS — L0291 Cutaneous abscess, unspecified: Secondary | ICD-10-CM | POA: Insufficient documentation

## 2012-03-20 DIAGNOSIS — Z79899 Other long term (current) drug therapy: Secondary | ICD-10-CM | POA: Insufficient documentation

## 2012-03-20 NOTE — ED Notes (Signed)
Reports pain to right shoulder, denies injury to arm or shoulder. Pain increases with movement, red area noted, pt thinks possible spider bite.

## 2012-03-20 NOTE — ED Provider Notes (Signed)
History     CSN: [id number]  Arrival date & time 03/20/12  1235   First MD Initiated Contact with Patient 03/20/12 1351      Chief Complaint  Patient presents with  . Shoulder Pain  . Abscess    (Consider location/radiation/quality/duration/timing/severity/associated sxs/prior treatment) HPI  Vicki Brown is a 53 y.o. female with past medical history significant for HIV complaining of pruritic lesion to the anterior shoulder over the course of the week. She does state that there is pain when she itches it.  Pain scale is 0/10 right now. Denies sick contacts or trauma but thinks she may have been bitten by a spider. She states her last CD4 count was over thousand. She denies fever, chest pain, cough, shortness of breath, abdominal pain, nausea vomiting, headache, change in bowel or bladder habits.  Past Medical History  Diagnosis Date  . HIV infection   . Hypertension     History reviewed. No pertinent past surgical history.  Family History  Problem Relation Age of Onset  . Cancer Mother     uterine vs ovvarian?  . Seizures Father   . Mental illness Brother   . Cancer Maternal Aunt     breast CA    History  Substance Use Topics  . Smoking status: Former Smoker    Types: Cigarettes  . Smokeless tobacco: Never Used  . Alcohol Use: Yes     Comment: rarely    OB History    Grav Para Term Preterm Abortions TAB SAB Ect Mult Living                  Review of Systems  Constitutional: Negative for fever.  Respiratory: Negative for shortness of breath.   Cardiovascular: Negative for chest pain.  Gastrointestinal: Negative for nausea, vomiting, abdominal pain and diarrhea.  All other systems reviewed and are negative.    Allergies  Review of patient's allergies indicates no known allergies.  Home Medications   Current Outpatient Rx  Name  Route  Sig  Dispense  Refill  . EFAVIRENZ-EMTRICITAB-TENOFOVIR 600-200-300 MG PO TABS   Oral   Take 1 tablet by mouth  at bedtime.   30 tablet   6   . IBUPROFEN 800 MG PO TABS   Oral   Take 800 mg by mouth every 8 (eight) hours as needed. For pain         . LISINOPRIL-HYDROCHLOROTHIAZIDE 10-12.5 MG PO TABS   Oral   Take 1 tablet by mouth daily.   30 tablet   11   . OSELTAMIVIR PHOSPHATE 75 MG PO CAPS   Oral   Take 1 capsule (75 mg total) by mouth every 12 (twelve) hours.   10 capsule   0     BP 135/58  Pulse 79  Temp 99.3 F (37.4 C) (Oral)  Resp 18  SpO2 97%  Physical Exam  Nursing note and vitals reviewed. Constitutional: She is oriented to person, place, and time. She appears well-developed and well-nourished. No distress.  HENT:  Head: Normocephalic.  Eyes: Conjunctivae normal and EOM are normal.  Cardiovascular: Normal rate.   Pulmonary/Chest: Effort normal. No stridor.  Musculoskeletal: Normal range of motion.  Neurological: She is alert and oriented to person, place, and time.  Skin:     Psychiatric: She has a normal mood and affect.    ED Course  Procedures (including critical care time)  Labs Reviewed - No data to display No results found.   1. Tinea  corporis       MDM  Patient has elevated LFTs. A would like to be aggressive with her treatment, however, I don't think that the threat to to her liver outweighs the benefit of  fluconazole treatment. I have advised topical treatment andgiven her dermatology followup.    Pt verbalized understanding and agrees with care plan. Outpatient follow-up and return precautions given.           Wynetta Emery, PA-C 03/20/12 1445

## 2012-03-21 ENCOUNTER — Encounter: Payer: Self-pay | Admitting: Internal Medicine

## 2012-03-21 ENCOUNTER — Telehealth: Payer: Self-pay | Admitting: *Deleted

## 2012-03-21 ENCOUNTER — Ambulatory Visit (INDEPENDENT_AMBULATORY_CARE_PROVIDER_SITE_OTHER): Payer: No Typology Code available for payment source | Admitting: Internal Medicine

## 2012-03-21 VITALS — BP 104/73 | HR 84 | Temp 98.6°F | Ht 66.0 in | Wt 157.5 lb

## 2012-03-21 DIAGNOSIS — Z79899 Other long term (current) drug therapy: Secondary | ICD-10-CM

## 2012-03-21 DIAGNOSIS — B359 Dermatophytosis, unspecified: Secondary | ICD-10-CM | POA: Insufficient documentation

## 2012-03-21 DIAGNOSIS — L299 Pruritus, unspecified: Secondary | ICD-10-CM

## 2012-03-21 DIAGNOSIS — B20 Human immunodeficiency virus [HIV] disease: Secondary | ICD-10-CM

## 2012-03-21 DIAGNOSIS — IMO0002 Reserved for concepts with insufficient information to code with codable children: Secondary | ICD-10-CM

## 2012-03-21 DIAGNOSIS — R109 Unspecified abdominal pain: Secondary | ICD-10-CM

## 2012-03-21 MED ORDER — CLOTRIMAZOLE 1 % EX CREA: TOPICAL_CREAM | Freq: Two times a day (BID) | CUTANEOUS | Status: DC

## 2012-03-21 NOTE — Progress Notes (Signed)
Patient ID: Vicki Brown, female   DOB: 1960-05-22, 52 y.o.   MRN: [id number]     [facility]  Patient Active Problem List  Diagnosis  . HIV DISEASE  . PERIPHERAL NEUROPATHY  . HYPERTENSION  . SORE THROAT  . ABDOMINAL PAIN, LOWER  . Dyslipidemia  . Transaminitis  . Musculoskeletal strain  . Ringworm  . Dyspareunia    Patient's Medications  New Prescriptions   No medications on file  Previous Medications   EFAVIRENZ-EMTRICITABINE-TENOFOVIR (ATRIPLA) 600-200-300 MG PER TABLET    Take 1 tablet by mouth at bedtime.   IBUPROFEN (ADVIL,MOTRIN) 800 MG TABLET    Take 800 mg by mouth every 8 (eight) hours as needed. For pain   LISINOPRIL-HYDROCHLOROTHIAZIDE (PRINZIDE,ZESTORETIC) 10-12.5 MG PER TABLET    Take 1 tablet by mouth daily.  Modified Medications   No medications on file  Discontinued Medications   OSELTAMIVIR (TAMIFLU) 75 MG CAPSULE    Take 1 capsule (75 mg total) by mouth every 12 (twelve) hours.    Subjective: Vicki Brown is seen on a work in basis. About 2 weeks ago she began to notice some intermittent lower abdominal pain. She cannot think of anything that caused it to come on or make it worse. Yesterday she began to notice a little bit of nausea without any vomiting. Over her the past 24 hours she has had 5 bowel movements but does not describe it as diarrhea. She also had one episode of discomfort with intercourse 3 days ago. She has a new female partner in the past 6 months. He is not aware of her HIV status but she has him wear condoms with each episode of intercourse. She thinks the condom may have broken on 3 occasions in the past month. She has not had any vaginal discharge itching or burning. She has no abnormal bleeding symptoms.  Over the past week she is also noted 2 small lesions on her trunk. The first one began on her anterior right shoulder. It was pruritic. It has enlarged to about the size of a nickel. She was seen in urgent care yesterday  was diagnosed with ringworm but has not started antifungal cream yet. In the last 2 days she's also noted a similar lesion beginning on the medial aspect of her left breast. She has not had any fever, chills or sweats.  She thinks she may have missed only 2-3 doses of her Atripla since her last visit 6 months ago.  Objective: Temp: 98.6 F (37 C) (01/13 1444) Temp src: Oral (01/13 1444) BP: 104/73 mmHg (01/13 1444) Pulse Rate: 84  (01/13 1444)  General: she appears comfortable and in no distress Skin: examination of her upper trunk reveals a nickel-sized circular lesion on her right anterior shoulder. The outside border of the lesion is slightly raised with the center ulceration. There are 2 small papules on the heel aspect of her left breast Lungs: clear Cor: regular S1 and S2 no murmurs Abdomen: soft and nontender. She is quiet but normal bowel sounds. I cannot recreate her lower abdominal pain.  Lab Results HIV 1 RNA Quant (copies/mL)  Date Value  09/14/2011 <20   06/26/2011 <20   02/11/2011 <20      CD4 T Cell Abs (cmm)  Date Value  09/14/2011 1000   06/26/2011 920   02/11/2011 1000      Assessment: Says that her adherence is good and her HIV infection is probably still under excellent control. I will  repeat a full set of lab work today.  The 2 areas on her upper trunk do appear compatible with tinea infection. I will have her start clotrimazole cream.  And not sure what is causing her lower abdominal pain. She may be beginning to have an acute gastroenteritis but is a little unusual to have discomfort for 2 weeks for developing nausea and diarrhea. I will check for any evidence of sexually transmitted diseases, set her up for a Pap smear and pelvic exam within the next week.  Plan: 1. Continue Atripla and try not to Miss any doses 2. Check routine lab work for CD4 count, viral load, RPR, and urine for GC and Chlamydia 3. Stool for culture and C. Difficile PCR if she develops  diarrhea 4. A new supply of condoms is provided 5. F/U in 1 week for pelvic exam   Vicki Asters, MD [facility] [phone] pager   [phone] cell 03/21/2012, 3:15 PM

## 2012-03-21 NOTE — ED Provider Notes (Signed)
Medical screening examination/treatment/procedure(s) were performed by non-physician practitioner and as supervising physician I was immediately available for consultation/collaboration.   Laray Anger, DO 03/21/12 [phone]

## 2012-03-21 NOTE — Telephone Encounter (Signed)
Patient called c/o abdominal pain x 4 days, no nausea or vomiting. She was at the ED yesterday for a rash and was dx with ringworm. Patient given appointment for today. Wendall Mola

## 2012-03-22 LAB — COMPREHENSIVE METABOLIC PANEL
Albumin: 4.1 g/dL (ref 3.5–5.2)
BUN: 13 mg/dL (ref 6–23)
Calcium: 9.2 mg/dL (ref 8.4–10.5)
Chloride: 104 mEq/L (ref 96–112)
Glucose, Bld: 98 mg/dL (ref 70–99)
Potassium: 3.8 mEq/L (ref 3.5–5.3)

## 2012-03-22 LAB — LIPID PANEL
HDL: 79 mg/dL (ref >39)
Triglycerides: 124 mg/dL (ref <150)

## 2012-03-22 LAB — CBC
HCT: 36.6 % (ref 36.0–46.0)
Hemoglobin: 12.4 g/dL (ref 12.0–15.0)
WBC: 4.6 10*3/uL (ref 4.0–10.5)

## 2012-03-22 LAB — RPR

## 2012-03-22 LAB — T-HELPER CELL (CD4) - (RCID CLINIC ONLY): CD4 T Cell Abs: 1080 uL (ref 400–2700)

## 2012-03-22 LAB — HIV-1 RNA QUANT-NO REFLEX-BLD: HIV-1 RNA Quant, Log: <1.3 {Log} (ref <1.30)

## 2012-03-25 LAB — CLOSTRIDIUM DIFFICILE BY PCR: Toxigenic C. Difficile by PCR: NOT DETECTED

## 2012-03-31 ENCOUNTER — Ambulatory Visit (INDEPENDENT_AMBULATORY_CARE_PROVIDER_SITE_OTHER): Payer: No Typology Code available for payment source | Admitting: *Deleted

## 2012-03-31 ENCOUNTER — Ambulatory Visit: Payer: No Typology Code available for payment source

## 2012-03-31 DIAGNOSIS — Z113 Encounter for screening for infections with a predominantly sexual mode of transmission: Secondary | ICD-10-CM

## 2012-03-31 NOTE — Patient Instructions (Signed)
Your results will be ready in about a week.  Dr. Ninetta Lights will call you if there are any abnormal results.  Your next PAP smear appointment needs to be scheduled in May 2014.  Thank you for coming to the Center for your care.  Angelique Blonder, RN

## 2012-03-31 NOTE — Progress Notes (Signed)
Dr. Ninetta Lights requested that pt return for post -treatment screening for vaginal STDs.  Obtained specimen.  Slight whitish vaginal discharge.  No complaints of itching.  Pt described condom use with new partner that may have had several "broken" condoms.  Last PAP smear 07/2011, normal.  Pt informed to call for PAP smear appt for May 2014.

## 2012-04-04 ENCOUNTER — Encounter: Payer: Self-pay | Admitting: *Deleted

## 2012-04-06 ENCOUNTER — Other Ambulatory Visit: Payer: Self-pay | Admitting: *Deleted

## 2012-04-06 ENCOUNTER — Telehealth: Payer: Self-pay | Admitting: *Deleted

## 2012-04-06 DIAGNOSIS — N760 Acute vaginitis: Secondary | ICD-10-CM

## 2012-04-06 DIAGNOSIS — B9689 Other specified bacterial agents as the cause of diseases classified elsewhere: Secondary | ICD-10-CM

## 2012-04-06 MED ORDER — METRONIDAZOLE 500 MG PO TABS: 500.0000 mg | ORAL_TABLET | Freq: Two times a day (BID) | ORAL | Status: DC

## 2012-04-06 NOTE — Telephone Encounter (Signed)
Called and notified patient per Dr. Ninetta Lights that she has a vaginal infection and an Rx has been prescribed for her. Flagyl 500 mg. Sent to [facility]. Wendall Mola

## 2012-04-13 ENCOUNTER — Ambulatory Visit (INDEPENDENT_AMBULATORY_CARE_PROVIDER_SITE_OTHER): Payer: No Typology Code available for payment source | Admitting: Internal Medicine

## 2012-04-13 ENCOUNTER — Encounter: Payer: Self-pay | Admitting: Internal Medicine

## 2012-04-13 ENCOUNTER — Telehealth: Payer: Self-pay | Admitting: *Deleted

## 2012-04-13 VITALS — BP 107/74 | HR 96 | Temp 98.5°F | Ht 66.0 in | Wt 160.5 lb

## 2012-04-13 DIAGNOSIS — R109 Unspecified abdominal pain: Secondary | ICD-10-CM

## 2012-04-13 NOTE — Telephone Encounter (Signed)
Pt called c/o side effects after starting Flagyl, ie., shortness of breath, dark urine.  RN advised pt to stop the medication.  Pt given work-in appt w/ Dr. Ninetta Lights to discuss symptoms.

## 2012-04-13 NOTE — Progress Notes (Signed)
  Subjective:    Patient ID: Vicki Brown, female    DOB: [birth year], 52 y.o.   MRN: [id number]  HPI She comes in for followup of her vaginitis. She had a Pap smear and noted to have Gardnerella positive and started on Flagyl. She states she has had shortness of breath and some back pain since. No fever, no chills or any other issues. Some sore throat no sick contacts.   Review of Systems  Constitutional: Negative for fever, chills and fatigue.  Genitourinary: Negative for dysuria, urgency, frequency, hematuria, vaginal discharge, difficulty urinating and genital sores.  Musculoskeletal: Positive for back pain.       Objective:   Physical Exam  Constitutional: She appears well-developed and well-nourished. No distress.  Cardiovascular: Normal rate, regular rhythm and normal heart sounds.  Exam reveals no gallop and no friction rub.   No murmur heard. Pulmonary/Chest: Effort normal and breath sounds normal. No respiratory distress. She has no wheezes. She has no rales.          Assessment & Plan:

## 2012-04-14 LAB — CBC WITH DIFFERENTIAL/PLATELET
Eosinophils Absolute: 0 10*3/uL (ref 0.0–0.7)
Eosinophils Relative: 1 % (ref 0–5)
Hemoglobin: 12.2 g/dL (ref 12.0–15.0)
Lymphs Abs: 1.9 10*3/uL (ref 0.7–4.0)
MCH: 32 pg (ref 26.0–34.0)
MCV: 95.3 fL (ref 78.0–100.0)
Monocytes Relative: 6 % (ref 3–12)
RBC: 3.81 MIL/uL — ABNORMAL LOW (ref 3.87–5.11)

## 2012-04-14 LAB — COMPLETE METABOLIC PANEL WITH GFR
BUN: 12 mg/dL (ref 6–23)
CO2: 27 mEq/L (ref 19–32)
Creat: 0.78 mg/dL (ref 0.50–1.10)
GFR, Est African American: >89 mL/min
GFR, Est Non African American: 87 mL/min
Glucose, Bld: 95 mg/dL (ref 70–99)
Total Bilirubin: 0.2 mg/dL — ABNORMAL LOW (ref 0.3–1.2)

## 2012-04-14 LAB — URINALYSIS, ROUTINE W REFLEX MICROSCOPIC
Bilirubin Urine: NEGATIVE
Hgb urine dipstick: NEGATIVE
Ketones, ur: NEGATIVE mg/dL
Specific Gravity, Urine: 1.018 (ref 1.005–1.030)
Urobilinogen, UA: 0.2 mg/dL (ref 0.0–1.0)

## 2012-04-15 ENCOUNTER — Ambulatory Visit: Payer: No Typology Code available for payment source | Admitting: Infectious Diseases

## 2012-05-30 ENCOUNTER — Other Ambulatory Visit: Payer: Self-pay

## 2012-05-30 ENCOUNTER — Ambulatory Visit: Payer: Self-pay

## 2012-06-13 ENCOUNTER — Ambulatory Visit: Payer: Self-pay

## 2012-06-13 ENCOUNTER — Encounter: Payer: Self-pay | Admitting: Infectious Diseases

## 2012-06-13 ENCOUNTER — Ambulatory Visit (INDEPENDENT_AMBULATORY_CARE_PROVIDER_SITE_OTHER): Payer: Self-pay | Admitting: Infectious Diseases

## 2012-06-13 VITALS — BP 120/87 | HR 77 | Temp 98.4°F | Ht 67.0 in | Wt 159.0 lb

## 2012-06-13 DIAGNOSIS — E785 Hyperlipidemia, unspecified: Secondary | ICD-10-CM

## 2012-06-13 DIAGNOSIS — I10 Essential (primary) hypertension: Secondary | ICD-10-CM

## 2012-06-13 DIAGNOSIS — B20 Human immunodeficiency virus [HIV] disease: Secondary | ICD-10-CM

## 2012-06-13 DIAGNOSIS — Z23 Encounter for immunization: Secondary | ICD-10-CM

## 2012-06-13 NOTE — Assessment & Plan Note (Signed)
Needs PNVX today. Given condoms. Will get mammogram in June 2014. Doing very well, her labs are explained to her. Will see her back in 6 months.

## 2012-06-13 NOTE — Progress Notes (Signed)
  Subjective:    Patient ID: Vicki Brown, female    DOB: 11/28/1960, 52 y.o.   MRN: [id number]  HPI 52 yo F with HIV+ since 05-2007, on atripla. Was seen recently for dysparunia, abd pain and increased frequency of normal BM. She had GYN eval and was found to have gardnerella. She was given a course of flagyl which gave her SOB.  Needs pnvx today (due June 2014).  No problems with art.   HIV 1 RNA Quant (copies/mL)  Date Value  03/21/2012 <20   09/14/2011 <20   06/26/2011 <20      CD4 T Cell Abs (cmm)  Date Value  03/21/2012 1080   09/14/2011 1000   06/26/2011 920     Review of Systems  Constitutional: Negative for appetite change and unexpected weight change.  Gastrointestinal: Negative for diarrhea and constipation.  Genitourinary: Negative for difficulty urinating and menstrual problem.       Amennorheic       Objective:   Physical Exam  Constitutional: She appears well-developed and well-nourished.  HENT:  Mouth/Throat: No oropharyngeal exudate.  Eyes: EOM are normal. Pupils are equal, round, and reactive to light.  Neck: Neck supple.  Cardiovascular: Normal rate, regular rhythm and normal heart sounds.   Pulmonary/Chest: Effort normal and breath sounds normal.  Abdominal: Soft. Bowel sounds are normal. There is no tenderness. There is no rebound.  Lymphadenopathy:    She has no cervical adenopathy.          Assessment & Plan:

## 2012-06-13 NOTE — Assessment & Plan Note (Signed)
Well controlled on current rx. Continue to watch.

## 2012-06-14 NOTE — Addendum Note (Signed)
Addended by: Wendall Mola A on: 06/14/2012 09:16 AM   Modules accepted: Orders

## 2012-06-17 ENCOUNTER — Other Ambulatory Visit: Payer: Self-pay | Admitting: *Deleted

## 2012-06-17 DIAGNOSIS — B20 Human immunodeficiency virus [HIV] disease: Secondary | ICD-10-CM

## 2012-06-17 MED ORDER — EFAVIRENZ-EMTRICITAB-TENOFOVIR 600-200-300 MG PO TABS: 1.0000 | ORAL_TABLET | Freq: Every day | ORAL | Status: DC

## 2012-06-20 ENCOUNTER — Ambulatory Visit: Payer: Self-pay

## 2012-06-24 ENCOUNTER — Other Ambulatory Visit: Payer: Self-pay | Admitting: Infectious Diseases

## 2012-06-24 DIAGNOSIS — Z1231 Encounter for screening mammogram for malignant neoplasm of breast: Secondary | ICD-10-CM

## 2012-08-29 ENCOUNTER — Ambulatory Visit (HOSPITAL_COMMUNITY)
Admission: RE | Admit: 2012-08-29 | Discharge: 2012-08-29 | Disposition: A | Discharge status: Home / Self Care | Payer: No Typology Code available for payment source | Source: Ambulatory Visit | Attending: Infectious Diseases | Admitting: Infectious Diseases

## 2012-08-29 DIAGNOSIS — Z1231 Encounter for screening mammogram for malignant neoplasm of breast: Secondary | ICD-10-CM

## 2012-08-30 ENCOUNTER — Telehealth: Payer: Self-pay | Admitting: *Deleted

## 2012-08-30 NOTE — Telephone Encounter (Signed)
Patient called requesting an appt, said she needed a day off from work, because she's tired. I offered her an appt for Thursday, but she refused saying that was her day off. I asked what other symptoms she was having and she said leg pain and said she was going to have to go to the ED. Given appt for tomorrow with Dr. Orvan Falconer. Vicki Brown

## 2012-08-31 ENCOUNTER — Ambulatory Visit: Payer: No Typology Code available for payment source | Admitting: Internal Medicine

## 2012-09-16 ENCOUNTER — Other Ambulatory Visit: Payer: Self-pay | Admitting: Infectious Diseases

## 2012-09-16 ENCOUNTER — Other Ambulatory Visit: Payer: Self-pay | Admitting: Internal Medicine

## 2012-09-22 ENCOUNTER — Other Ambulatory Visit: Payer: Self-pay | Admitting: Infectious Diseases

## 2012-09-22 ENCOUNTER — Other Ambulatory Visit: Payer: Self-pay | Admitting: Internal Medicine

## 2012-09-23 ENCOUNTER — Other Ambulatory Visit: Payer: Self-pay | Admitting: Infectious Diseases

## 2012-10-27 ENCOUNTER — Ambulatory Visit: Payer: No Typology Code available for payment source

## 2012-11-02 ENCOUNTER — Encounter (HOSPITAL_COMMUNITY): Payer: Self-pay | Admitting: *Deleted

## 2012-11-02 ENCOUNTER — Emergency Department (HOSPITAL_COMMUNITY)
Admission: EM | Admit: 2012-11-02 | Discharge: 2012-11-02 | Disposition: A | Discharge status: Home / Self Care | Payer: No Typology Code available for payment source | Attending: Emergency Medicine | Admitting: Emergency Medicine

## 2012-11-02 ENCOUNTER — Emergency Department (HOSPITAL_COMMUNITY): Payer: No Typology Code available for payment source

## 2012-11-02 DIAGNOSIS — Z87891 Personal history of nicotine dependence: Secondary | ICD-10-CM | POA: Insufficient documentation

## 2012-11-02 DIAGNOSIS — M25542 Pain in joints of left hand: Secondary | ICD-10-CM

## 2012-11-02 DIAGNOSIS — B20 Human immunodeficiency virus [HIV] disease: Secondary | ICD-10-CM | POA: Insufficient documentation

## 2012-11-02 DIAGNOSIS — M25549 Pain in joints of unspecified hand: Secondary | ICD-10-CM | POA: Insufficient documentation

## 2012-11-02 DIAGNOSIS — I10 Essential (primary) hypertension: Secondary | ICD-10-CM | POA: Insufficient documentation

## 2012-11-02 DIAGNOSIS — Z79899 Other long term (current) drug therapy: Secondary | ICD-10-CM | POA: Insufficient documentation

## 2012-11-02 MED ORDER — IBUPROFEN 400 MG PO TABS
800.0000 mg | ORAL_TABLET | Freq: Once | ORAL | Status: AC
  Administered 2012-11-02: 800 mg via ORAL
  Filled 2012-11-02: qty 2

## 2012-11-02 MED ORDER — HYDROCODONE-ACETAMINOPHEN 5-325 MG PO TABS: 1.0000 | ORAL_TABLET | ORAL | Status: DC | PRN

## 2012-11-02 MED ORDER — IBUPROFEN 800 MG PO TABS: 800.0000 mg | ORAL_TABLET | Freq: Three times a day (TID) | ORAL | Status: DC

## 2012-11-02 NOTE — ED Notes (Signed)
Pt in c/o left middle finger swelling and pain since this am, denies injury

## 2012-11-02 NOTE — ED Provider Notes (Signed)
CSN: [id number]     Arrival date & time 11/02/12  1809 History  This chart was scribed for Dierdre Forth, PA working with Shon Baton, MD by Quintella Reichert, ED Scribe. This patient was seen in room TR11C/TR11C and the patient's care was started at 7:16 PM.    Chief Complaint  Patient presents with  . Finger swelling     The history is provided by the patient. No language interpreter was used.    HPI Comments: Vicki Brown is a 52 y.o. female with h/o HIV and HTN who presents to the Emergency Department complaining of sudden-onset progressively-worsening left middle finger swelling and moderate pain that began this morning.  Pt denies any recent injuries to her knowledge but states it is possible that she may have hit her hand against something at work yesterday.  She has attempted to treat symptoms by rubbing "green alcohol" on the finger, without relief.  She also states that her fingers feel stiff.  She denies fever, chills, nausea, or vomiting. Pt does have pins in her left hand which were placed after a fall several years ago.  She denies prior h/o gout.  Pt states she is using her retroviral medications as well as her HTN medications as prescribed.  She states her last CD4 count was 1000.     Past Medical History  Diagnosis Date  . HIV infection   . Hypertension     History reviewed. No pertinent past surgical history.   Family History  Problem Relation Age of Onset  . Cancer Mother     uterine vs ovvarian?  . Seizures Father   . Mental illness Brother   . Cancer Maternal Aunt     breast CA    History  Substance Use Topics  . Smoking status: Former Smoker    Types: Cigarettes  . Smokeless tobacco: Never Used  . Alcohol Use: Yes     Comment: rarely     OB History   Grav Para Term Preterm Abortions TAB SAB Ect Mult Living                   Review of Systems  HENT: Negative for neck pain and neck stiffness.   Musculoskeletal: Positive for joint  swelling and arthralgias. Negative for back pain.  Skin: Negative for wound.  Neurological: Negative for numbness.  All other systems reviewed and are negative.      Allergies  Review of patient's allergies indicates no known allergies.  Home Medications   Current Outpatient Rx  Name  Route  Sig  Dispense  Refill  . efavirenz-emtricitabine-tenofovir (ATRIPLA) 600-200-300 MG per tablet   Oral   Take 1 tablet by mouth at bedtime.   30 tablet   6   . ibuprofen (ADVIL,MOTRIN) 800 MG tablet   Oral   Take 800 mg by mouth every 8 (eight) hours as needed for pain.          [name] lisinopril-hydrochlorothiazide (PRINZIDE,ZESTORETIC) 10-12.5 MG per tablet   Oral   Take 1 tablet by mouth daily.         [name] HYDROcodone-acetaminophen (NORCO/VICODIN) 5-325 MG per tablet   Oral   Take 1 tablet by mouth every 4 (four) hours as needed for pain.   15 tablet   0   . ibuprofen (ADVIL,MOTRIN) 800 MG tablet   Oral   Take 1 tablet (800 mg total) by mouth 3 (three) times daily.   21 tablet   0  BP 138/90  Pulse 75  Temp(Src) 99 F (37.2 C) (Oral)  Resp 20  Wt 160 lb (72.576 kg)  BMI 25.05 kg/m2  SpO2 100%  Physical Exam  Nursing note and vitals reviewed. Constitutional: She is oriented to person, place, and time. She appears well-developed and well-nourished. No distress.  Awake, alert, nontoxic appearance  HENT:  Head: Normocephalic and atraumatic.  Mouth/Throat: Oropharynx is clear and moist. No oropharyngeal exudate.  Eyes: Conjunctivae are normal. No scleral icterus.  Neck: Normal range of motion. Neck supple.  Cardiovascular: Normal rate, regular rhythm, normal heart sounds and intact distal pulses.   No murmur heard. Pulmonary/Chest: Effort normal and breath sounds normal. No respiratory distress. She has no wheezes.  Abdominal: Soft. Bowel sounds are normal. She exhibits no mass. There is no tenderness. There is no rebound and no guarding.  Musculoskeletal: Normal range  of motion. She exhibits no edema.       Left hand: She exhibits tenderness. She exhibits normal capillary refill. Normal strength noted.  Pain to palpation of DIP and PIP of left middle finger Swelling generalized throughout the finger, very mild and not localized to the joint spaces No erythema or ecchymosis.   No tenderness to palpation of the tendon;  No dupuytren's contracture Negative Phalen and Tinel's  Neurological: She is alert and oriented to person, place, and time. She exhibits normal muscle tone. Coordination normal.  Speech is clear and goal oriented Moves extremities without ataxia Sensation intact bilateral upper extremities Strength 5 out of 5 in the bilateral upper chin to his including resisted flexion and extension of the fingers of the left hand  Skin: Skin is warm and dry. She is not diaphoretic.  Psychiatric: She has a normal mood and affect.    ED Course  Procedures (including critical care time)  DIAGNOSTIC STUDIES: Oxygen Saturation is 100% on room air, normal by my interpretation.    COORDINATION OF CARE: 7:25 PM-Discussed treatment plan which includes imaging with pt at bedside and pt agreed to plan.     Labs Review Labs Reviewed - No data to display  Imaging Review Dg Hand Complete Left  11/02/2012   *RADIOLOGY REPORT*  Clinical Data: Left middle finger pain and swelling extending into the metacarpals  LEFT HAND - COMPLETE 3+ VIEW  Comparison: Left forearm radiographs - 08/11/2005  Findings:  Post ORIF of the distal radius, incompletely evaluated.  No fracture or dislocation.  Degenerative change of the radiocarpal articulations.  Additional joint spaces are preserved.  No definite erosions. No evidence of chondrocalcinosis.  Regional soft tissues are normal.  No radiopaque foreign body.  IMPRESSION: No explanation for patient's middle finger pain and swelling.   Original Report Authenticated By: Tacey Ruiz, MD    MDM   1. Arthralgia of hand, left    2. HIV (human immunodeficiency virus infection)      Vicki Brown presents with pain to the PIP and DIP joints of the middle finger of the left hand with associated mild swelling. Patient X-Ray negative for obvious fracture or dislocation. I personally reviewed the imaging tests through PACS system.  No erythema, increased warmth to the area to indicate a cellulitis or septic joint. Patient without tenderness along the tendon to indicate tenosynovitis. I reviewed available ER/hospitalization records through the EMR.  Pain managed in ED. Pt advised to follow up with hand orthopedics if symptoms persist for further evaluation. Conservative therapy recommended and discussed. Patient will be dc home with anti-inflammatories and  pain medication & is agreeable with above plan.    I personally performed the services described in this documentation, which was scribed in my presence. The recorded information has been reviewed and is accurate.    Dahlia Client Bailie Christenbury, PA-C 11/02/12 [id number]

## 2012-11-02 NOTE — ED Notes (Signed)
Patient transported to X-ray 

## 2012-11-03 NOTE — ED Provider Notes (Signed)
Medical screening examination/treatment/procedure(s) were performed by non-physician practitioner and as supervising physician I was immediately available for consultation/collaboration.   Shon Baton, MD 11/03/12 [phone]

## 2012-12-14 ENCOUNTER — Other Ambulatory Visit: Payer: Self-pay

## 2012-12-21 ENCOUNTER — Ambulatory Visit (INDEPENDENT_AMBULATORY_CARE_PROVIDER_SITE_OTHER): Payer: Self-pay

## 2012-12-21 DIAGNOSIS — I10 Essential (primary) hypertension: Secondary | ICD-10-CM

## 2012-12-21 DIAGNOSIS — B20 Human immunodeficiency virus [HIV] disease: Secondary | ICD-10-CM

## 2012-12-21 LAB — COMPREHENSIVE METABOLIC PANEL
AST: 75 U/L — ABNORMAL HIGH (ref 0–37)
Alkaline Phosphatase: 167 U/L — ABNORMAL HIGH (ref 39–117)
BUN: 10 mg/dL (ref 6–23)
Creat: 0.74 mg/dL (ref 0.50–1.10)

## 2012-12-21 LAB — CBC
HCT: 32.6 % — ABNORMAL LOW (ref 36.0–46.0)
MCHC: 34.4 g/dL (ref 30.0–36.0)
MCV: 95 fL (ref 78.0–100.0)
RDW: 14.3 % (ref 11.5–15.5)

## 2012-12-22 LAB — HIV-1 RNA QUANT-NO REFLEX-BLD: HIV-1 RNA Quant, Log: <1.3 {Log} (ref <1.30)

## 2012-12-28 ENCOUNTER — Encounter: Payer: Self-pay | Admitting: Infectious Diseases

## 2012-12-28 ENCOUNTER — Ambulatory Visit (INDEPENDENT_AMBULATORY_CARE_PROVIDER_SITE_OTHER): Payer: Self-pay | Admitting: Infectious Diseases

## 2012-12-28 VITALS — BP 131/87 | HR 75 | Temp 97.7°F | Ht 66.0 in | Wt 162.0 lb

## 2012-12-28 DIAGNOSIS — Z113 Encounter for screening for infections with a predominantly sexual mode of transmission: Secondary | ICD-10-CM

## 2012-12-28 DIAGNOSIS — Z23 Encounter for immunization: Secondary | ICD-10-CM

## 2012-12-28 DIAGNOSIS — I10 Essential (primary) hypertension: Secondary | ICD-10-CM

## 2012-12-28 DIAGNOSIS — Z79899 Other long term (current) drug therapy: Secondary | ICD-10-CM

## 2012-12-28 DIAGNOSIS — J3089 Other allergic rhinitis: Secondary | ICD-10-CM | POA: Insufficient documentation

## 2012-12-28 DIAGNOSIS — J309 Allergic rhinitis, unspecified: Secondary | ICD-10-CM

## 2012-12-28 DIAGNOSIS — B20 Human immunodeficiency virus [HIV] disease: Secondary | ICD-10-CM

## 2012-12-28 NOTE — Progress Notes (Signed)
  Subjective:    Patient ID: Vicki Brown, female    DOB: 07-29-1960, 52 y.o.   MRN: [id number]  HPI 52 yo F with HIV+ since 3-09. Has been on atripla. No problems with ART. Had normal mammogram in June 2014. Has been taking lisinopril-hctz for her bp, no problems.   HIV 1 RNA Quant (copies/mL)  Date Value  12/21/2012 <20   03/21/2012 <20   09/14/2011 <20      CD4 T Cell Abs (/uL)  Date Value  12/21/2012 760   03/21/2012 1080   09/14/2011 1000    Review of Systems  Constitutional: Negative for appetite change and unexpected weight change.  Respiratory: Negative for cough.   Cardiovascular: Negative for chest pain and leg swelling.  Gastrointestinal: Negative for diarrhea and constipation.  Genitourinary: Negative for difficulty urinating and menstrual problem.       Ammenorrheic  Neurological: Negative for headaches.       Objective:   Physical Exam  Constitutional: She appears well-developed and well-nourished.  HENT:  Mouth/Throat: No oropharyngeal exudate.  Eyes: EOM are normal. Pupils are equal, round, and reactive to light.  Neck: Neck supple.  Cardiovascular: Normal rate, regular rhythm and normal heart sounds.   Pulmonary/Chest: Effort normal and breath sounds normal.  Abdominal: Soft. Bowel sounds are normal. She exhibits no distension. There is no tenderness. There is no rebound.  Musculoskeletal: She exhibits no edema.  Lymphadenopathy:    She has no cervical adenopathy.          Assessment & Plan:

## 2012-12-28 NOTE — Assessment & Plan Note (Signed)
Doing very well on her current meds. Will continue to watch.

## 2012-12-28 NOTE — Assessment & Plan Note (Signed)
Will try OTCs- claritin, zyrtec.

## 2012-12-28 NOTE — Assessment & Plan Note (Addendum)
She is doing very well. Will continue her current art. Gets condoms today. Gets flu vax today. Has gotten mammo and PAP this year. Has serologic evidence of efficacy of Hep B vax 2012.  Will see her back in 6 months.

## 2012-12-29 DIAGNOSIS — Z23 Encounter for immunization: Secondary | ICD-10-CM

## 2013-03-06 ENCOUNTER — Other Ambulatory Visit: Payer: Self-pay | Admitting: *Deleted

## 2013-03-06 DIAGNOSIS — B20 Human immunodeficiency virus [HIV] disease: Secondary | ICD-10-CM

## 2013-03-06 MED ORDER — EFAVIRENZ-EMTRICITAB-TENOFOVIR 600-200-300 MG PO TABS: 1.0000 | ORAL_TABLET | Freq: Every day | ORAL | Status: DC

## 2013-04-28 ENCOUNTER — Ambulatory Visit: Payer: Self-pay

## 2013-04-28 ENCOUNTER — Telehealth: Payer: Self-pay | Admitting: *Deleted

## 2013-04-28 NOTE — Telephone Encounter (Signed)
Made new PAP smear appt.

## 2013-05-03 ENCOUNTER — Ambulatory Visit: Payer: Self-pay

## 2013-05-03 ENCOUNTER — Encounter: Payer: Self-pay | Admitting: *Deleted

## 2013-05-12 ENCOUNTER — Other Ambulatory Visit: Payer: Self-pay | Admitting: Licensed Clinical Social Worker

## 2013-05-12 ENCOUNTER — Ambulatory Visit: Payer: Self-pay

## 2013-05-12 DIAGNOSIS — B20 Human immunodeficiency virus [HIV] disease: Secondary | ICD-10-CM

## 2013-05-12 MED ORDER — EFAVIRENZ-EMTRICITAB-TENOFOVIR 600-200-300 MG PO TABS: 1.0000 | ORAL_TABLET | Freq: Every day | ORAL | Status: DC

## 2013-05-19 ENCOUNTER — Telehealth: Payer: Self-pay | Admitting: *Deleted

## 2013-05-19 ENCOUNTER — Ambulatory Visit: Payer: Self-pay

## 2013-05-19 NOTE — Telephone Encounter (Signed)
Moved PAP smear appt to next Friday

## 2013-05-26 ENCOUNTER — Ambulatory Visit (INDEPENDENT_AMBULATORY_CARE_PROVIDER_SITE_OTHER): Payer: Self-pay | Admitting: *Deleted

## 2013-05-26 DIAGNOSIS — Z1231 Encounter for screening mammogram for malignant neoplasm of breast: Secondary | ICD-10-CM

## 2013-05-26 DIAGNOSIS — Z124 Encounter for screening for malignant neoplasm of cervix: Secondary | ICD-10-CM

## 2013-05-26 NOTE — Patient Instructions (Signed)
Your results will be ready in about a week.  I will mail them to you.  Thank you for coming to the Center for your care.  Denise,  RN 

## 2013-05-26 NOTE — Progress Notes (Signed)
  Subjective:     Vicki Brown is a 53 y.o. woman who comes in today for a  pap smear only.  Previous abnormal Pap smears: no. Contraception: condoms.  Objective:    There were no vitals taken for this visit. Pelvic Exam:  Pap smear obtained.   Assessment:    Screening pap smear.   Plan:    Follow up in one year, or as indicated by Pap results.  Pt given educational materials re: HIV and women, self-esteem, BSE, nutrition and diet management, PAP smears and partner safety. Pt given condoms.

## 2013-06-01 ENCOUNTER — Encounter: Payer: Self-pay | Admitting: *Deleted

## 2013-06-14 ENCOUNTER — Other Ambulatory Visit: Payer: Self-pay

## 2013-06-26 ENCOUNTER — Other Ambulatory Visit: Payer: Self-pay | Admitting: Infectious Diseases

## 2013-06-28 ENCOUNTER — Ambulatory Visit (INDEPENDENT_AMBULATORY_CARE_PROVIDER_SITE_OTHER): Payer: Self-pay | Admitting: Infectious Diseases

## 2013-06-28 VITALS — BP 141/71 | HR 91 | Temp 99.0°F | Ht 67.0 in | Wt 157.0 lb

## 2013-06-28 DIAGNOSIS — B20 Human immunodeficiency virus [HIV] disease: Secondary | ICD-10-CM

## 2013-06-28 DIAGNOSIS — J309 Allergic rhinitis, unspecified: Secondary | ICD-10-CM

## 2013-06-28 DIAGNOSIS — I10 Essential (primary) hypertension: Secondary | ICD-10-CM

## 2013-06-28 DIAGNOSIS — Z113 Encounter for screening for infections with a predominantly sexual mode of transmission: Secondary | ICD-10-CM

## 2013-06-28 DIAGNOSIS — Z79899 Other long term (current) drug therapy: Secondary | ICD-10-CM

## 2013-06-28 DIAGNOSIS — E785 Hyperlipidemia, unspecified: Secondary | ICD-10-CM

## 2013-06-28 LAB — CBC
HEMATOCRIT: 37.4 % (ref 36.0–46.0)
Hemoglobin: 12.8 g/dL (ref 12.0–15.0)
MCH: 32.2 pg (ref 26.0–34.0)
MCHC: 34.2 g/dL (ref 30.0–36.0)
MCV: 94.2 fL (ref 78.0–100.0)
PLATELETS: 242 10*3/uL (ref 150–400)
RBC: 3.97 MIL/uL (ref 3.87–5.11)
RDW: 14.9 % (ref 11.5–15.5)
WBC: 2.4 10*3/uL — AB (ref 4.0–10.5)

## 2013-06-28 LAB — COMPREHENSIVE METABOLIC PANEL
ALBUMIN: 4 g/dL (ref 3.5–5.2)
ALK PHOS: 135 U/L — AB (ref 39–117)
ALT: 32 U/L (ref 0–35)
AST: 45 U/L — AB (ref 0–37)
BUN: 11 mg/dL (ref 6–23)
CHLORIDE: 107 meq/L (ref 96–112)
CO2: 25 mEq/L (ref 19–32)
CREATININE: 0.76 mg/dL (ref 0.50–1.10)
Calcium: 9.1 mg/dL (ref 8.4–10.5)
Glucose, Bld: 91 mg/dL (ref 70–99)
POTASSIUM: 4.1 meq/L (ref 3.5–5.3)
Sodium: 140 mEq/L (ref 135–145)
Total Bilirubin: 0.2 mg/dL (ref 0.2–1.2)
Total Protein: 7.5 g/dL (ref 6.0–8.3)

## 2013-06-28 LAB — LIPID PANEL
CHOL/HDL RATIO: 2.8 ratio
Cholesterol: 244 mg/dL — ABNORMAL HIGH (ref 0–200)
HDL: 87 mg/dL (ref >39)
LDL CALC: 142 mg/dL — AB (ref 0–99)
TRIGLYCERIDES: 76 mg/dL (ref <150)
VLDL: 15 mg/dL (ref 0–40)

## 2013-06-28 NOTE — Progress Notes (Signed)
   Subjective:    Patient ID: Vicki Brown, female    DOB: Sep 13, 1960, 53 y.o.   MRN: [id number]  HPI 53 yo F with HIV+ since 3-09. Has been on atripla. Had normal PAP 05-26-13, mammogram in June 2014. Repeat scheduled for July. Has been taking lisinopril-hctz for her bp. No problems with ART. Missed lab appt.  Having sinus drainage, no headahces. Itchy eyes, sneezing.   HIV 1 RNA Quant (copies/mL)  Date Value  12/21/2012 <20   03/21/2012 <20   09/14/2011 <20      CD4 T Cell Abs (/uL)  Date Value  12/21/2012 760   03/21/2012 1080   09/14/2011 1000     Review of Systems  Constitutional: Negative for fever, chills, appetite change and unexpected weight change.  Cardiovascular: Negative for chest pain.  Gastrointestinal: Negative for diarrhea and constipation.  Genitourinary: Negative for difficulty urinating and menstrual problem.  Neurological: Negative for headaches.       Objective:   Physical Exam  Constitutional: She appears well-developed and well-nourished.  HENT:  Mouth/Throat: No oropharyngeal exudate.  Eyes: EOM are normal. Pupils are equal, round, and reactive to light.  Neck: Neck supple.  Cardiovascular: Normal rate, regular rhythm and normal heart sounds.   Pulmonary/Chest: Effort normal and breath sounds normal.  Abdominal: Soft. Bowel sounds are normal. She exhibits no distension. There is no tenderness.  Musculoskeletal: She exhibits no edema.  Lymphadenopathy:    She has no cervical adenopathy.          Assessment & Plan:

## 2013-06-28 NOTE — Addendum Note (Signed)
Addended byMarlis Edelson on: 06/28/2013 11:16 AM   Modules accepted: Orders

## 2013-06-28 NOTE — Assessment & Plan Note (Signed)
Doing well on current therapy. Will continue to watch.

## 2013-06-28 NOTE — Assessment & Plan Note (Signed)
Doing well. Will continue her current ART. Will have her back in 6 months. Has up coming mammo. Offered/refused condoms.

## 2013-06-28 NOTE — Assessment & Plan Note (Signed)
Wrote down names of OTCs to try, avoid "D" containing medicines.

## 2013-06-28 NOTE — Assessment & Plan Note (Signed)
Will reheck today. Last Chol was 200.

## 2013-06-29 LAB — URINALYSIS, ROUTINE W REFLEX MICROSCOPIC
BILIRUBIN URINE: NEGATIVE
Glucose, UA: NEGATIVE mg/dL
HGB URINE DIPSTICK: NEGATIVE
KETONES UR: NEGATIVE mg/dL
Leukocytes, UA: NEGATIVE
NITRITE: NEGATIVE
PROTEIN: NEGATIVE mg/dL
Specific Gravity, Urine: 1.019 (ref 1.005–1.030)
UROBILINOGEN UA: 0.2 mg/dL (ref 0.0–1.0)
pH: 5.5 (ref 5.0–8.0)

## 2013-06-29 LAB — T-HELPER CELL (CD4) - (RCID CLINIC ONLY)
CD4 T CELL ABS: 920 /uL (ref 400–2700)
CD4 T CELL HELPER: 57 % — AB (ref 33–55)

## 2013-06-29 LAB — RPR

## 2013-06-29 LAB — URINE CYTOLOGY ANCILLARY ONLY
Chlamydia: NEGATIVE
NEISSERIA GONORRHEA: NEGATIVE

## 2013-06-30 ENCOUNTER — Other Ambulatory Visit: Payer: Self-pay | Admitting: Infectious Diseases

## 2013-06-30 LAB — HIV-1 RNA QUANT-NO REFLEX-BLD
HIV 1 RNA Quant: <20 copies/mL (ref <20)
HIV-1 RNA Quant, Log: <1.3 {Log} (ref <1.30)

## 2013-07-09 ENCOUNTER — Emergency Department (HOSPITAL_COMMUNITY)
Admission: EM | Admit: 2013-07-09 | Discharge: 2013-07-09 | Disposition: A | Discharge status: Home / Self Care | Payer: Self-pay | Attending: Emergency Medicine | Admitting: Emergency Medicine

## 2013-07-09 ENCOUNTER — Emergency Department (HOSPITAL_COMMUNITY): Payer: Self-pay

## 2013-07-09 ENCOUNTER — Encounter (HOSPITAL_COMMUNITY): Payer: Self-pay | Admitting: Emergency Medicine

## 2013-07-09 DIAGNOSIS — R109 Unspecified abdominal pain: Secondary | ICD-10-CM

## 2013-07-09 DIAGNOSIS — Z21 Asymptomatic human immunodeficiency virus [HIV] infection status: Secondary | ICD-10-CM | POA: Insufficient documentation

## 2013-07-09 DIAGNOSIS — Z79899 Other long term (current) drug therapy: Secondary | ICD-10-CM | POA: Insufficient documentation

## 2013-07-09 DIAGNOSIS — K5900 Constipation, unspecified: Secondary | ICD-10-CM | POA: Insufficient documentation

## 2013-07-09 DIAGNOSIS — Z87891 Personal history of nicotine dependence: Secondary | ICD-10-CM | POA: Insufficient documentation

## 2013-07-09 DIAGNOSIS — I10 Essential (primary) hypertension: Secondary | ICD-10-CM | POA: Insufficient documentation

## 2013-07-09 DIAGNOSIS — Z791 Long term (current) use of non-steroidal anti-inflammatories (NSAID): Secondary | ICD-10-CM | POA: Insufficient documentation

## 2013-07-09 LAB — CBC WITH DIFFERENTIAL/PLATELET
Basophils Absolute: 0 10*3/uL (ref 0.0–0.1)
Basophils Relative: 0 % (ref 0–1)
Eosinophils Absolute: 0 10*3/uL (ref 0.0–0.7)
Eosinophils Relative: 1 % (ref 0–5)
HEMATOCRIT: 36.5 % (ref 36.0–46.0)
HEMOGLOBIN: 12.6 g/dL (ref 12.0–15.0)
LYMPHS ABS: 2.3 10*3/uL (ref 0.7–4.0)
LYMPHS PCT: 63 % — AB (ref 12–46)
MCH: 33.4 pg (ref 26.0–34.0)
MCHC: 34.5 g/dL (ref 30.0–36.0)
MCV: 96.8 fL (ref 78.0–100.0)
MONO ABS: 0.2 10*3/uL (ref 0.1–1.0)
MONOS PCT: 4 % (ref 3–12)
NEUTROS ABS: 1.2 10*3/uL — AB (ref 1.7–7.7)
NEUTROS PCT: 32 % — AB (ref 43–77)
Platelets: 221 10*3/uL (ref 150–400)
RBC: 3.77 MIL/uL — AB (ref 3.87–5.11)
RDW: 13.7 % (ref 11.5–15.5)
WBC: 3.7 10*3/uL — ABNORMAL LOW (ref 4.0–10.5)

## 2013-07-09 LAB — URINALYSIS, ROUTINE W REFLEX MICROSCOPIC
BILIRUBIN URINE: NEGATIVE
Glucose, UA: NEGATIVE mg/dL
Hgb urine dipstick: NEGATIVE
KETONES UR: NEGATIVE mg/dL
Leukocytes, UA: NEGATIVE
NITRITE: NEGATIVE
PH: 5 (ref 5.0–8.0)
PROTEIN: NEGATIVE mg/dL
Specific Gravity, Urine: 1.022 (ref 1.005–1.030)
Urobilinogen, UA: 0.2 mg/dL (ref 0.0–1.0)

## 2013-07-09 LAB — COMPREHENSIVE METABOLIC PANEL
ALBUMIN: 3.5 g/dL (ref 3.5–5.2)
ALK PHOS: 157 U/L — AB (ref 39–117)
ALT: 41 U/L — ABNORMAL HIGH (ref 0–35)
AST: 62 U/L — ABNORMAL HIGH (ref 0–37)
BUN: 10 mg/dL (ref 6–23)
CHLORIDE: 108 meq/L (ref 96–112)
CO2: 20 meq/L (ref 19–32)
Calcium: 8.7 mg/dL (ref 8.4–10.5)
Creatinine, Ser: 0.8 mg/dL (ref 0.50–1.10)
GFR, EST NON AFRICAN AMERICAN: 82 mL/min — AB (ref >90)
GLUCOSE: 81 mg/dL (ref 70–99)
POTASSIUM: 3.9 meq/L (ref 3.7–5.3)
Sodium: 144 mEq/L (ref 137–147)
Total Protein: 8.1 g/dL (ref 6.0–8.3)

## 2013-07-09 LAB — POC OCCULT BLOOD, ED: Fecal Occult Bld: NEGATIVE

## 2013-07-09 LAB — LIPASE, BLOOD: LIPASE: 32 U/L (ref 11–59)

## 2013-07-09 MED ORDER — POLYETHYLENE GLYCOL 3350 17 G PO PACK: 17.0000 g | PACK | Freq: Every day | ORAL | Status: DC

## 2013-07-09 MED ORDER — KETOROLAC TROMETHAMINE 60 MG/2ML IM SOLN
30.0000 mg | Freq: Once | INTRAMUSCULAR | Status: AC
  Administered 2013-07-09: 30 mg via INTRAMUSCULAR
  Filled 2013-07-09: qty 2

## 2013-07-09 NOTE — ED Notes (Signed)
Pt reporting abdominal cramping for 1.5 weeks. Reports diarrhea, with small red blood. No vomiting. Is HIV positive. Is a x 4. VSS

## 2013-07-09 NOTE — Discharge Instructions (Signed)
Constipation, Adult  Constipation is when a person:  · Poops (bowel movement) less than 3 times a week.  · Has a hard time pooping.  · Has poop that is dry, hard, or bigger than normal.  HOME CARE   · Eat more fiber, such as fruits, vegetables, whole grains like brown rice, and beans.  · Eat less fatty foods and sugar. This includes French fries, hamburgers, cookies, candy, and soda.  · If you are not getting enough fiber from food, take products with added fiber in them (supplements).  · Drink enough fluid to keep your pee (urine) clear or pale yellow.  · Go to the restroom when you feel like you need to poop. Do not hold it.  · Only take medicine as told by your doctor. Do not take medicines that help you poop (laxatives) without talking to your doctor first.  · Exercise on a regular basis, or as told by your doctor.  GET HELP RIGHT AWAY IF:   · You have bright red blood in your poop (stool).  · Your constipation lasts more than 4 days or gets worse.  · You have belly (abdomen) or butt (rectal) pain.  · You have thin poop (as thin as a pencil).  · You lose weight, and it cannot be explained.  MAKE SURE YOU:   · Understand these instructions.  · Will watch your condition.  · Will get help right away if you are not doing well or get worse.  Document Released: 08/12/2007 Document Revised: 05/18/2011 Document Reviewed: 12/05/2012  [employer]® Patient Information ©2014 [employer].

## 2013-07-09 NOTE — ED Provider Notes (Signed)
CSN: [id number]     Arrival date & time 07/09/13  0923 History   First MD Initiated Contact with Patient 07/09/13 1137     Chief Complaint  Patient presents with  . Abdominal Pain     (Consider location/radiation/quality/duration/timing/severity/associated sxs/prior Treatment) HPI  53 year old female with history of HIV, hypertension who presents complaining of abdominal discomfort. Patient states for the past week and half she has had intermittent sharp pain to her low abdomen. Pain usually lasting for a minute, resolved, and comes back again. Nothing seems to make it better or worse. She was taking ibuprofen but ran out of her ibuprofen. She endorses mild nausea only. She mentioned that she usually having bowel movement daily but has not had one for the past week and feels constipated. She denies fever, chills, headache, and vomiting, chest pain, shortness of breath, productive cough, back pain, dysuria, hematuria, hematochezia or melena. States she tries straining to have a bowel movement with little success. She is able to pass flatus. Denies any prior abdominal surgery. Does not recall her last CD4 count or viral load. Patient has not had a colonoscopy  Past Medical History  Diagnosis Date  . HIV infection   . Hypertension    History reviewed. No pertinent past surgical history. Family History  Problem Relation Age of Onset  . Cancer Mother     uterine vs ovvarian?  . Seizures Father   . Mental illness Brother   . Cancer Maternal Aunt     breast CA  . Diabetes Daughter    History  Substance Use Topics  . Smoking status: Former Smoker    Types: Cigarettes  . Smokeless tobacco: Never Used  . Alcohol Use: Yes     Comment: rarely   OB History   Grav Para Term Preterm Abortions TAB SAB Ect Mult Living                 Review of Systems  All other systems reviewed and are negative.     Allergies  Review of patient's allergies indicates no known allergies.  Home  Medications   Prior to Admission medications   Medication Sig Start Date End Date Taking? Authorizing Provider  efavirenz-emtricitabine-tenofovir (ATRIPLA) [phone] MG per tablet Take 1 tablet by mouth at bedtime. 05/12/13   Carlyle Basques, MD  ibuprofen (ADVIL,MOTRIN) 800 MG tablet Take 1 tablet (800 mg total) by mouth 3 (three) times daily. 11/02/12   Hannah Muthersbaugh, PA-C  ibuprofen (ADVIL,MOTRIN) 800 MG tablet TAKE ONE TABLET BY MOUTH EVERY 8 HOURS AS NEEDED 06/30/13   Campbell Riches, MD  lisinopril-hydrochlorothiazide (PRINZIDE,ZESTORETIC) 10-12.5 MG per tablet Take 1 tablet by mouth daily.    Historical Provider, MD   BP 144/83  Pulse 70  Temp(Src) 98.2 F (36.8 C) (Oral)  Resp 16  Wt 160 lb (72.576 kg)  SpO2 98% Physical Exam  Nursing note and vitals reviewed. Constitutional: She appears well-developed and well-nourished. No distress.  Awake, alert, nontoxic appearance  HENT:  Head: Atraumatic.  Mouth/Throat: Oropharynx is clear and moist.  Eyes: Conjunctivae are normal. Right eye exhibits no discharge. Left eye exhibits no discharge.  Neck: Neck supple.  Cardiovascular: Normal rate and regular rhythm.   Pulmonary/Chest: Effort normal. No respiratory distress. She exhibits no tenderness.  Abdominal: Soft. Bowel sounds are normal. She exhibits no distension. There is tenderness (very mild suprapubic abdominal tenderness without guarding or rebound tenderness). There is no rebound and no guarding.  Genitourinary:  No CVA tenderness  Musculoskeletal:  She exhibits no tenderness.  Skin: No rash noted.  Psychiatric: She has a normal mood and affect.    ED Course  Procedures (including critical care time)  Patient here with complaint of low abdominal pain and constipation. Has not had a bowel movement for one week. However abdomen is soft with minimal tenderness. She is afebrile with stable normal vital sign.  Pt has transaminitis but this is at her baseline when comparing  to prior labs value.  Hx of HIV since 2009, currently on ART.  Cared for by Dr. Johnnye Sima.    1:24 PM On rectal exam patient has no significant stool burden in the rectal vault and no evidence of impaction. Acute abdominal series demonstrate no acute finding, no evidence suggestive of SBO or significant stool burden. Patient's labs are reassuring, she is afebrile with stable normal vital sign.  1:57 PM Pt felt better, requesting discharge.  Pt stable for discharge.    Labs Review Labs Reviewed  CBC WITH DIFFERENTIAL - Abnormal; Notable for the following:    WBC 3.7 (*)    RBC 3.77 (*)    Neutrophils Relative % 32 (*)    Neutro Abs 1.2 (*)    Lymphocytes Relative 63 (*)    All other components within normal limits  COMPREHENSIVE METABOLIC PANEL - Abnormal; Notable for the following:    AST 62 (*)    ALT 41 (*)    Alkaline Phosphatase 157 (*)    Total Bilirubin <0.2 (*)    GFR calc non Af Amer 82 (*)    All other components within normal limits  LIPASE, BLOOD  URINALYSIS, ROUTINE W REFLEX MICROSCOPIC  POC OCCULT BLOOD, ED    Imaging Review Dg Abd Acute W/chest  07/09/2013   CLINICAL DATA:  Abdominal pain.  EXAM: ACUTE ABDOMEN SERIES (ABDOMEN 2 VIEW & CHEST 1 VIEW)  COMPARISON:  DG LUMBAR SPINE COMPLETE dated 06/24/2011; CT ABD/PELVIS W CM dated 06/16/2009  FINDINGS: Chest x-ray reveals no acute cardiopulmonary disease.  Soft tissues of the abdomen are unremarkable. The gas pattern is nonspecific. No free air is identified. No acute bony abnormality.  IMPRESSION: Nonspecific exam.  No acute abnormalities identified.   Electronically Signed   By: Marcello Moores  Register   On: 07/09/2013 13:14     EKG Interpretation None      MDM   Final diagnoses:  Abdominal pain  Constipation    BP 144/83  Pulse 70  Temp(Src) 98.2 F (36.8 C) (Oral)  Resp 16  Wt 160 lb (72.576 kg)  SpO2 98%  I have reviewed nursing notes and vital signs. I personally reviewed the imaging tests through PACS  system  I reviewed available ER/hospitalization records thought the EMR     Domenic Moras, PA-C 07/09/13 [place], PA-C 07/10/13 1504

## 2013-07-11 NOTE — ED Provider Notes (Signed)
History/physical exam/procedure(s) were performed by non-physician practitioner and as supervising physician I was immediately available for consultation/collaboration. I have reviewed all notes and am in agreement with care and plan.   Shaune Pollack, MD 07/11/13 [phone]

## 2013-08-22 ENCOUNTER — Other Ambulatory Visit: Payer: Self-pay | Admitting: Internal Medicine

## 2013-09-06 ENCOUNTER — Ambulatory Visit (HOSPITAL_COMMUNITY): Admission: RE | Admit: 2013-09-06 | Payer: Self-pay | Source: Ambulatory Visit

## 2013-10-29 ENCOUNTER — Other Ambulatory Visit: Payer: Self-pay | Admitting: Internal Medicine

## 2013-11-22 ENCOUNTER — Other Ambulatory Visit: Payer: Self-pay | Admitting: *Deleted

## 2013-11-22 DIAGNOSIS — B20 Human immunodeficiency virus [HIV] disease: Secondary | ICD-10-CM

## 2013-11-22 MED ORDER — EFAVIRENZ-EMTRICITAB-TENOFOVIR 600-200-300 MG PO TABS: 1.0000 | ORAL_TABLET | Freq: Every day | ORAL | Status: DC

## 2013-12-14 ENCOUNTER — Other Ambulatory Visit: Payer: Self-pay

## 2013-12-27 ENCOUNTER — Ambulatory Visit (INDEPENDENT_AMBULATORY_CARE_PROVIDER_SITE_OTHER): Payer: Self-pay | Admitting: *Deleted

## 2013-12-27 ENCOUNTER — Other Ambulatory Visit (INDEPENDENT_AMBULATORY_CARE_PROVIDER_SITE_OTHER): Payer: Self-pay

## 2013-12-27 DIAGNOSIS — B20 Human immunodeficiency virus [HIV] disease: Secondary | ICD-10-CM

## 2013-12-27 DIAGNOSIS — Z23 Encounter for immunization: Secondary | ICD-10-CM

## 2013-12-27 LAB — CBC WITH DIFFERENTIAL/PLATELET
Basophils Absolute: 0 10*3/uL (ref 0.0–0.1)
Basophils Relative: 0 % (ref 0–1)
EOS ABS: 0 10*3/uL (ref 0.0–0.7)
EOS PCT: 1 % (ref 0–5)
HCT: 33.3 % — ABNORMAL LOW (ref 36.0–46.0)
Hemoglobin: 11.2 g/dL — ABNORMAL LOW (ref 12.0–15.0)
LYMPHS ABS: 1.3 10*3/uL (ref 0.7–4.0)
Lymphocytes Relative: 48 % — ABNORMAL HIGH (ref 12–46)
MCH: 32.6 pg (ref 26.0–34.0)
MCHC: 33.6 g/dL (ref 30.0–36.0)
MCV: 96.8 fL (ref 78.0–100.0)
MONOS PCT: 5 % (ref 3–12)
Monocytes Absolute: 0.1 10*3/uL (ref 0.1–1.0)
Neutro Abs: 1.3 10*3/uL — ABNORMAL LOW (ref 1.7–7.7)
Neutrophils Relative %: 46 % (ref 43–77)
PLATELETS: 221 10*3/uL (ref 150–400)
RBC: 3.44 MIL/uL — AB (ref 3.87–5.11)
RDW: 13.7 % (ref 11.5–15.5)
WBC: 2.8 10*3/uL — ABNORMAL LOW (ref 4.0–10.5)

## 2013-12-27 LAB — COMPLETE METABOLIC PANEL WITH GFR
ALT: 50 U/L — ABNORMAL HIGH (ref 0–35)
AST: 68 U/L — ABNORMAL HIGH (ref 0–37)
Albumin: 3.9 g/dL (ref 3.5–5.2)
Alkaline Phosphatase: 148 U/L — ABNORMAL HIGH (ref 39–117)
BILIRUBIN TOTAL: 0.3 mg/dL (ref 0.2–1.2)
BUN: 11 mg/dL (ref 6–23)
CO2: 23 meq/L (ref 19–32)
CREATININE: 0.82 mg/dL (ref 0.50–1.10)
Calcium: 8.8 mg/dL (ref 8.4–10.5)
Chloride: 108 mEq/L (ref 96–112)
GFR, EST NON AFRICAN AMERICAN: 81 mL/min
GLUCOSE: 89 mg/dL (ref 70–99)
Potassium: 4 mEq/L (ref 3.5–5.3)
Sodium: 141 mEq/L (ref 135–145)
Total Protein: 7.1 g/dL (ref 6.0–8.3)

## 2013-12-28 ENCOUNTER — Ambulatory Visit: Payer: Self-pay | Admitting: Infectious Diseases

## 2013-12-28 LAB — T-HELPER CELL (CD4) - (RCID CLINIC ONLY)
CD4 % Helper T Cell: 58 % — ABNORMAL HIGH (ref 33–55)
CD4 T CELL ABS: 870 /uL (ref 400–2700)

## 2013-12-28 LAB — HIV-1 RNA QUANT-NO REFLEX-BLD: HIV 1 RNA Quant: <20 copies/mL (ref <20)

## 2014-01-02 ENCOUNTER — Ambulatory Visit: Payer: Self-pay | Admitting: Infectious Diseases

## 2014-01-08 ENCOUNTER — Other Ambulatory Visit: Payer: Self-pay | Admitting: Infectious Diseases

## 2014-01-10 ENCOUNTER — Other Ambulatory Visit: Payer: Self-pay | Admitting: Infectious Diseases

## 2014-01-12 ENCOUNTER — Other Ambulatory Visit: Payer: Self-pay | Admitting: Infectious Diseases

## 2014-01-12 DIAGNOSIS — M25569 Pain in unspecified knee: Secondary | ICD-10-CM

## 2014-01-12 MED ORDER — IBUPROFEN 800 MG PO TABS: 800.0000 mg | ORAL_TABLET | Freq: Two times a day (BID) | ORAL | Status: DC

## 2014-01-24 ENCOUNTER — Ambulatory Visit: Payer: Self-pay | Admitting: Infectious Diseases

## 2014-02-28 ENCOUNTER — Ambulatory Visit: Payer: Self-pay | Admitting: Infectious Diseases

## 2014-03-14 ENCOUNTER — Encounter: Payer: Self-pay | Admitting: Infectious Diseases

## 2014-03-14 ENCOUNTER — Ambulatory Visit (INDEPENDENT_AMBULATORY_CARE_PROVIDER_SITE_OTHER): Payer: No Typology Code available for payment source | Admitting: Infectious Diseases

## 2014-03-14 VITALS — BP 140/90 | HR 67 | Temp 98.5°F | Ht 66.0 in | Wt 160.0 lb

## 2014-03-14 DIAGNOSIS — E785 Hyperlipidemia, unspecified: Secondary | ICD-10-CM

## 2014-03-14 DIAGNOSIS — I10 Essential (primary) hypertension: Secondary | ICD-10-CM

## 2014-03-14 DIAGNOSIS — R0789 Other chest pain: Secondary | ICD-10-CM

## 2014-03-14 DIAGNOSIS — B20 Human immunodeficiency virus [HIV] disease: Secondary | ICD-10-CM

## 2014-03-14 DIAGNOSIS — Z113 Encounter for screening for infections with a predominantly sexual mode of transmission: Secondary | ICD-10-CM

## 2014-03-14 DIAGNOSIS — Z79899 Other long term (current) drug therapy: Secondary | ICD-10-CM

## 2014-03-14 NOTE — Assessment & Plan Note (Signed)
She is doing well on her current art. Will set her up for PAP/Mammo after March.  She is given condoms.  Will see her back in 6 months.

## 2014-03-14 NOTE — Assessment & Plan Note (Signed)
Will repeat her labs at her f/u.

## 2014-03-14 NOTE — Assessment & Plan Note (Addendum)
Doing well on her current meds. Will get GXT even though her pain is reproducible on anterior comperssion on exam.

## 2014-03-14 NOTE — Progress Notes (Signed)
   Subjective:    Patient ID: Vicki Brown, female    DOB: 02/15/1961, 54 y.o.   MRN: [id number]  HPI 54 yo F with HIV+ since 3-09. Has been on atripla. Had normal PAP 05-26-13, mammogram in June 2014. Has been taking lisinopril-hctz for her bp.  Has been taking atripla well. No problems with BP.    HIV 1 RNA QUANT (copies/mL)  Date Value  12/27/2013 <20  06/28/2013 <20  12/21/2012 <20   CD4 T CELL ABS (/uL)  Date Value  12/27/2013 870  06/28/2013 920  12/21/2012 760   Review of Systems  Constitutional: Negative for appetite change and unexpected weight change.  Respiratory: Negative for shortness of breath.   Cardiovascular: Positive for chest pain.  Gastrointestinal: Negative for diarrhea and constipation.  Genitourinary: Negative for vaginal bleeding, difficulty urinating and menstrual problem.  Neurological: Negative for headaches.  no radiation, no SOB, non-exertional, ? Stress related.       Objective:   Physical Exam  Constitutional: She appears well-developed and well-nourished.  HENT:  Mouth/Throat: No oropharyngeal exudate.  Eyes: EOM are normal. Pupils are equal, round, and reactive to light.  Neck: Neck supple.  Cardiovascular: Normal rate, regular rhythm and normal heart sounds.   Pulmonary/Chest: Effort normal and breath sounds normal.  Abdominal: Soft. Bowel sounds are normal. She exhibits no distension. There is no tenderness.  Musculoskeletal: She exhibits no edema.  Lymphadenopathy:    She has no cervical adenopathy.          Assessment & Plan:

## 2014-03-14 NOTE — Addendum Note (Signed)
Addended by: Landis Gandy on: 03/14/2014 02:17 PM   Modules accepted: Orders

## 2014-03-22 ENCOUNTER — Ambulatory Visit: Payer: Self-pay

## 2014-03-26 ENCOUNTER — Other Ambulatory Visit: Payer: Self-pay | Admitting: *Deleted

## 2014-03-26 DIAGNOSIS — B20 Human immunodeficiency virus [HIV] disease: Secondary | ICD-10-CM

## 2014-03-26 MED ORDER — EFAVIRENZ-EMTRICITAB-TENOFOVIR 600-200-300 MG PO TABS: 1.0000 | ORAL_TABLET | Freq: Every day | ORAL | Status: DC

## 2014-04-06 ENCOUNTER — Ambulatory Visit (INDEPENDENT_AMBULATORY_CARE_PROVIDER_SITE_OTHER): Payer: No Typology Code available for payment source | Admitting: Cardiovascular Disease

## 2014-04-06 ENCOUNTER — Encounter: Payer: Self-pay | Admitting: Cardiovascular Disease

## 2014-04-06 VITALS — BP 130/80 | HR 78 | Ht 66.0 in | Wt 160.0 lb

## 2014-04-06 DIAGNOSIS — R0600 Dyspnea, unspecified: Secondary | ICD-10-CM

## 2014-04-06 DIAGNOSIS — I10 Essential (primary) hypertension: Secondary | ICD-10-CM

## 2014-04-06 NOTE — Patient Instructions (Signed)

## 2014-04-06 NOTE — Progress Notes (Signed)
Patient ID: Vicki Brown, female   DOB: Jan 24, 1961, 54 y.o.   MRN: [id number]    Cardiology Office Note   Date:  04/06/2014   ID:  Vicki Brown, Vicki Brown [birth year], MRN [id number]  PCP:  Bobby Rumpf, MD  Cardiologist:   Jenkins Rouge, MD   No chief complaint on file.     History of Present Illness: Vicki Brown is a 54 y.o. female who presents for evaluation of atypical chest pain, HTN  She is referred by Dr Johnnye Sima who sees her for HIV on antiretroviral Rx  BP has been controlled on ACE and diuretic.  She still smokes mariajuana.  During her exam with Dr Johnnye Sima he touched her chest and she had pain.  He suggested she see cardiology.  She denies any other chest pains.  None with work at [employer] and [employer]  None with exertion or walking.  No pleuritic or muscular pain.  Mild exertional dyspnea.  Compliant with meds and denies  Other drugs.  No history of lupus or other connective tissue disease      Labs reviewed from 1/9 and normal  CD 4 counts generally above 1000 and low viral counts     Past Medical History  Diagnosis Date  . HIV infection   . Hypertension     No past surgical history on file.   Current Outpatient Prescriptions  Medication Sig Dispense Refill  . efavirenz-emtricitabine-tenofovir (ATRIPLA) [phone] MG per tablet Take 1 tablet by mouth at bedtime. 30 tablet 6  . ibuprofen (ADVIL,MOTRIN) 800 MG tablet Take 1 tablet (800 mg total) by mouth 2 (two) times daily. 60 tablet 0  . lisinopril-hydrochlorothiazide (PRINZIDE,ZESTORETIC) 10-12.5 MG per tablet TAKE ONE TABLET BY MOUTH ONCE DAILY 30 tablet 3  . polyethylene glycol (MIRALAX / GLYCOLAX) packet Take 17 g by mouth daily. 14 each 0   No current facility-administered medications for this visit.    Allergies:   Review of patient's allergies indicates no known allergies.    Social History:  The patient  reports that she has quit smoking. Her smoking use included Cigarettes. She has never used smokeless tobacco.  She reports that she drinks alcohol. She reports that she uses illicit drugs about once per week.   Family History:  The patient's family history includes Cancer in her maternal aunt and mother; Diabetes in her daughter; Mental illness in her brother; Seizures in her father.    ROS:  Please see the history of present illness.   Otherwise, review of systems are positive for none.   All other systems are reviewed and negative.    PHYSICAL EXAM: VS:  There were no vitals taken for this visit. , BMI There is no weight on file to calculate BMI. GEN: Well nourished, well developed, in no acute distress HEENT: normal Neck: no JVD, carotid bruits, or masses Cardiac: RRR; no murmurs, rubs, or gallops,no edema  Respiratory:  clear to auscultation bilaterally, normal work of breathing GI: soft, nontender, nondistended, + BS MS: no deformity or atrophy Skin: warm and dry, no rash Neuro:  Strength and sensation are intact Psych: euthymic mood, full affect   EKG:  SR rate 78 normal 04/06/14    Recent Labs: 12/27/2013: ALT 50*; BUN 11; Creatinine 0.82; Hemoglobin 11.2*; Platelets 221; Potassium 4.0; Sodium 141    Lipid Panel    Component Value Date/Time   CHOL 244* 06/28/2013 1115   TRIG 76 06/28/2013 1115   HDL 87 06/28/2013 1115   CHOLHDL 2.8  06/28/2013 1115   VLDL 15 06/28/2013 1115   LDLCALC 142* 06/28/2013 1115      Wt Readings from Last 3 Encounters:  03/14/14 72.576 kg (160 lb)  07/09/13 72.576 kg (160 lb)  06/28/13 71.215 kg (157 lb)      Other studies Reviewed: Additional studies/ records that were reviewed today include:  Office notes Dr Johnnye Sima .    ASSESSMENT AND PLAN:  1.  Dyspnea:  Mild no chest pain given HIV and its associated heart disease will order echo Do not think stress test indicated 2.  HIV  F/u Dr Johnnye Sima CD 4 count good compliant with meds 3.  HTN   Well controlled  4.  Smoking  Discussed mariajuana use with her she indicates recreational use and  not regular use   Current medicines are reviewed at length with the patient today.  The patient does not have concerns regarding medicines.  The following changes have been made:  no change  Labs/ tests ordered today include:   Echo  No orders of the defined types were placed in this encounter.     Disposition:   FU with PRN     Signed, Jenkins Rouge, MD  04/06/2014 3:16 PM    [facility] [address], [place], [place]  [address] Phone: [phone]; Fax: [phone]

## 2014-04-11 ENCOUNTER — Ambulatory Visit (HOSPITAL_COMMUNITY): Payer: No Typology Code available for payment source | Attending: Cardiovascular Disease | Admitting: Cardiology

## 2014-04-11 DIAGNOSIS — R0600 Dyspnea, unspecified: Secondary | ICD-10-CM | POA: Insufficient documentation

## 2014-04-11 NOTE — Progress Notes (Signed)
Echo performed. 

## 2014-05-05 ENCOUNTER — Other Ambulatory Visit: Payer: Self-pay | Admitting: Internal Medicine

## 2014-05-05 DIAGNOSIS — B20 Human immunodeficiency virus [HIV] disease: Secondary | ICD-10-CM

## 2014-05-30 ENCOUNTER — Encounter: Payer: Self-pay | Admitting: Infectious Diseases

## 2014-05-30 ENCOUNTER — Ambulatory Visit (INDEPENDENT_AMBULATORY_CARE_PROVIDER_SITE_OTHER): Payer: Self-pay | Admitting: Infectious Diseases

## 2014-05-30 ENCOUNTER — Inpatient Hospital Stay (HOSPITAL_COMMUNITY): Admission: RE | Admit: 2014-05-30 | Payer: Self-pay | Source: Ambulatory Visit

## 2014-05-30 VITALS — BP 141/98 | HR 72 | Temp 98.1°F | Ht 67.0 in | Wt 159.0 lb

## 2014-05-30 DIAGNOSIS — Z113 Encounter for screening for infections with a predominantly sexual mode of transmission: Secondary | ICD-10-CM

## 2014-05-30 DIAGNOSIS — I10 Essential (primary) hypertension: Secondary | ICD-10-CM

## 2014-05-30 DIAGNOSIS — B20 Human immunodeficiency virus [HIV] disease: Secondary | ICD-10-CM

## 2014-05-30 DIAGNOSIS — Z79899 Other long term (current) drug therapy: Secondary | ICD-10-CM

## 2014-05-30 LAB — LIPID PANEL
CHOL/HDL RATIO: 2.4 ratio
Cholesterol: 206 mg/dL — ABNORMAL HIGH (ref 0–200)
HDL: 87 mg/dL (ref >=46)
LDL Cholesterol: 106 mg/dL — ABNORMAL HIGH (ref 0–99)
Triglycerides: 67 mg/dL (ref <150)
VLDL: 13 mg/dL (ref 0–40)

## 2014-05-30 LAB — COMPREHENSIVE METABOLIC PANEL
ALK PHOS: 132 U/L — AB (ref 39–117)
ALT: 28 U/L (ref 0–35)
AST: 42 U/L — ABNORMAL HIGH (ref 0–37)
Albumin: 3.8 g/dL (ref 3.5–5.2)
BUN: 9 mg/dL (ref 6–23)
CO2: 23 mEq/L (ref 19–32)
CREATININE: 0.78 mg/dL (ref 0.50–1.10)
Calcium: 9.1 mg/dL (ref 8.4–10.5)
Chloride: 104 mEq/L (ref 96–112)
Glucose, Bld: 83 mg/dL (ref 70–99)
Potassium: 4.1 mEq/L (ref 3.5–5.3)
SODIUM: 137 meq/L (ref 135–145)
Total Bilirubin: 0.3 mg/dL (ref 0.2–1.2)
Total Protein: 7.4 g/dL (ref 6.0–8.3)

## 2014-05-30 LAB — CBC
HCT: 35.4 % — ABNORMAL LOW (ref 36.0–46.0)
Hemoglobin: 12 g/dL (ref 12.0–15.0)
MCH: 32.7 pg (ref 26.0–34.0)
MCHC: 33.9 g/dL (ref 30.0–36.0)
MCV: 96.5 fL (ref 78.0–100.0)
MPV: 8.8 fL (ref 8.6–12.4)
Platelets: 227 10*3/uL (ref 150–400)
RBC: 3.67 MIL/uL — ABNORMAL LOW (ref 3.87–5.11)
RDW: 14 % (ref 11.5–15.5)
WBC: 2.6 10*3/uL — ABNORMAL LOW (ref 4.0–10.5)

## 2014-05-30 LAB — RPR

## 2014-05-30 NOTE — Progress Notes (Signed)
   Subjective:    Patient ID: Vicki Brown, female    DOB: July 06, 1960, 54 y.o.   MRN: [id number]  HPI 54 yo F with HIV+ since 3-09. Has been on atripla. Had normal PAP 05-26-13, mammogram in June 2014. Has been taking lisinopril-hctz for her bp. Has not taken yet today.  She was seen by CV for her reproducible CP. She had nl TTE, GXT was not felt to be warranted.  Has been stomach pain. Has taken no otcs. Pain is unchanged with eating.  Vaginal d/c. Has been sexually active. Has been using condoms.   HIV 1 RNA QUANT (copies/mL)  Date Value  12/27/2013 <20  06/28/2013 <20  12/21/2012 <20   CD4 T CELL ABS (/uL)  Date Value  12/27/2013 870  06/28/2013 920  12/21/2012 760    Review of Systems  Constitutional: Negative for appetite change and unexpected weight change.  Respiratory: Negative for shortness of breath.   Cardiovascular: Negative for chest pain.  Gastrointestinal: Negative for diarrhea and constipation.  Genitourinary: Positive for vaginal discharge. Negative for difficulty urinating.  Neurological: Negative for headaches.       Objective:   Physical Exam  Constitutional: She appears well-developed and well-nourished.  HENT:  Mouth/Throat: No oropharyngeal exudate.  Eyes: EOM are normal. Pupils are equal, round, and reactive to light.  Neck: Neck supple.  Cardiovascular: Normal rate, regular rhythm and normal heart sounds.   Pulmonary/Chest: Effort normal and breath sounds normal.  Abdominal: Soft. Bowel sounds are normal. She exhibits no distension. There is no tenderness.  Lymphadenopathy:    She has no cervical adenopathy.      Assessment & Plan:

## 2014-05-30 NOTE — Assessment & Plan Note (Signed)
She appears to be doing well. Will recheck her labs today.  Will set her up for mammo, pap, colon, optho. Offered/refused condoms.  Will try to get her in for orange card for colonoscopy.

## 2014-05-30 NOTE — Assessment & Plan Note (Signed)
She will take her rx after she leaves. She is asx.

## 2014-05-31 LAB — URINE CYTOLOGY ANCILLARY ONLY
CHLAMYDIA, DNA PROBE: NEGATIVE
Neisseria Gonorrhea: NEGATIVE

## 2014-05-31 LAB — T-HELPER CELL (CD4) - (RCID CLINIC ONLY)
CD4 T CELL ABS: 720 /uL (ref 400–2700)
CD4 T CELL HELPER: 56 % — AB (ref 33–55)

## 2014-06-01 LAB — HIV-1 RNA QUANT-NO REFLEX-BLD: HIV-1 RNA Quant, Log: <1.3 {Log} (ref <1.30)

## 2014-06-04 ENCOUNTER — Telehealth: Payer: Self-pay | Admitting: *Deleted

## 2014-06-04 NOTE — Telephone Encounter (Signed)
Patient has an orange card, however it expired 05/09/14. She will renew it, then contact me for her referrals via [facility] for ophthalmology, mammography, colonoscopy.  Pt aware that these referrals are made at the first of the month. Patient will get her PAP here with Lorne Skeens, RN. Pt declined to make a return appointment for PAP at this time, stating she would call back. Landis Gandy, RN

## 2014-06-06 NOTE — Telephone Encounter (Signed)
Spoke with patient; she has not yet renewed her orange card. Will make an appointment, will contact me back once it is active.

## 2014-07-24 ENCOUNTER — Telehealth: Payer: Self-pay | Admitting: *Deleted

## 2014-07-24 NOTE — Telephone Encounter (Signed)
Swollen, painful second toe x 4 days, possible insect bite.  Pain "8" on 1/10 scale.  Slight redness.  Given appt for evaluation for Wed., May 18 @ 2:30 w/ Dr. Tommy Medal.

## 2014-07-25 ENCOUNTER — Ambulatory Visit: Payer: Self-pay | Admitting: Infectious Disease

## 2014-08-20 ENCOUNTER — Emergency Department (INDEPENDENT_AMBULATORY_CARE_PROVIDER_SITE_OTHER): Payer: PRIVATE HEALTH INSURANCE

## 2014-08-20 ENCOUNTER — Emergency Department (HOSPITAL_COMMUNITY)
Admission: EM | Admit: 2014-08-20 | Discharge: 2014-08-20 | Disposition: A | Discharge status: Home / Self Care | Payer: PRIVATE HEALTH INSURANCE | Source: Home / Self Care | Attending: Family Medicine | Admitting: Family Medicine

## 2014-08-20 ENCOUNTER — Encounter (HOSPITAL_COMMUNITY): Payer: Self-pay | Admitting: *Deleted

## 2014-08-20 DIAGNOSIS — S92911A Unspecified fracture of right toe(s), initial encounter for closed fracture: Secondary | ICD-10-CM

## 2014-08-20 NOTE — ED Provider Notes (Addendum)
CSN: [id number]     Arrival date & time 08/20/14  1739 History   First MD Initiated Contact with Patient 08/20/14 1805     Chief Complaint  Patient presents with  . Foot Pain   (Consider location/radiation/quality/duration/timing/severity/associated sxs/prior Treatment) Patient is a 54 y.o. female presenting with lower extremity pain. The history is provided by the patient.  Foot Pain This is a new problem. The current episode started more than 1 week ago (3wk h/o pain and sts, worse past few days.).    Past Medical History  Diagnosis Date  . HIV infection   . Hypertension    History reviewed. No pertinent past surgical history. Family History  Problem Relation Age of Onset  . Cancer Mother     uterine vs ovvarian?  . Seizures Father   . Mental illness Brother   . Cancer Maternal Aunt     breast CA  . Diabetes Daughter    History  Substance Use Topics  . Smoking status: Former Smoker    Types: Cigarettes  . Smokeless tobacco: Never Used  . Alcohol Use: 0.0 oz/week    0 Standard drinks or equivalent per week     Comment: rarely   OB History    No data available     Review of Systems  Constitutional: Negative.   Musculoskeletal: Positive for joint swelling and gait problem.  Skin: Negative.  Negative for wound.    Allergies  Review of patient's allergies indicates no known allergies.  Home Medications   Prior to Admission medications   Medication Sig Start Date End Date Taking? Authorizing Provider  ATRIPLA [phone] MG per tablet TAKE 1 TABLET BY MOUTH EVERY NIGHT AT BEDTIME 05/07/14   Campbell Riches, MD  ibuprofen (ADVIL,MOTRIN) 800 MG tablet Take 1 tablet (800 mg total) by mouth 2 (two) times daily. Patient taking differently: Take 800 mg by mouth as needed for moderate pain.  01/12/14   Campbell Riches, MD  lisinopril-hydrochlorothiazide (PRINZIDE,ZESTORETIC) 10-12.5 MG per tablet TAKE ONE TABLET BY MOUTH ONCE DAILY 08/22/13   Truman Hayward, MD    polyethylene glycol Brand [facility] / Floria Raveling) packet Take 17 g by mouth daily. Patient taking differently: Take 17 g by mouth as needed for moderate constipation.  07/09/13   Domenic Moras, PA-C   BP 125/91 mmHg  Pulse 73  Temp(Src) 98.6 F (37 C) (Oral)  Resp 16  SpO2 100% Physical Exam  Constitutional: She is oriented to person, place, and time. She appears well-developed and well-nourished.  Musculoskeletal: She exhibits tenderness.       Feet:  Neurological: She is alert and oriented to person, place, and time.  Skin: Skin is warm and dry.  Nursing note and vitals reviewed.   ED Course  Procedures (including critical care time) Labs Review Labs Reviewed - No data to display  Imaging Review Dg Foot Complete Right  08/20/2014   CLINICAL DATA:  Pain along the top of the foot. Second toe is swollen.  EXAM: RIGHT FOOT COMPLETE - 3+ VIEW  COMPARISON:  None.  FINDINGS: Transverse fracture distal metaphysis proximal phalanx second toe. This might extend to the distal articular surface. Osteoid along the fracture margins indicates early healing response.  No malalignment at the Lisfranc joint. No additional fractures observed.  IMPRESSION: 1. Transverse metaphyseal fracture of the distal metaphysis of the proximal phalanx second toe. Possible extension to the distal articular surface. Early healing response with a osteoid deposition.   Electronically Signed  By: Van Clines M.D.   On: 08/20/2014 19:07   X-rays reviewed and report per radiologist.   MDM   1. Toe fracture, right, closed, initial encounter        Billy Fischer, MD 08/20/14 1926  Billy Fischer, MD 08/20/14 1927

## 2014-08-20 NOTE — Discharge Instructions (Signed)
Soak foot at bedtime, wear shoe as needed, advil for pain, see orthopedist if further problems.

## 2014-08-20 NOTE — ED Notes (Signed)
Pt  Complains  Of  Pain  r  Foot   She  Reports        That     She  Has  Had  The pain off  And  On  For   About  3  Weeks     She  denys  Any injury   The pt  Reports   Pain  Worse  Last  sev  Days

## 2014-09-26 ENCOUNTER — Other Ambulatory Visit (HOSPITAL_COMMUNITY)
Admission: RE | Admit: 2014-09-26 | Discharge: 2014-09-26 | Disposition: A | Discharge status: Home / Self Care | Payer: PRIVATE HEALTH INSURANCE | Source: Ambulatory Visit | Attending: Infectious Diseases | Admitting: Infectious Diseases

## 2014-09-26 ENCOUNTER — Ambulatory Visit: Payer: PRIVATE HEALTH INSURANCE

## 2014-09-26 ENCOUNTER — Other Ambulatory Visit (INDEPENDENT_AMBULATORY_CARE_PROVIDER_SITE_OTHER): Payer: PRIVATE HEALTH INSURANCE

## 2014-09-26 DIAGNOSIS — Z113 Encounter for screening for infections with a predominantly sexual mode of transmission: Secondary | ICD-10-CM | POA: Insufficient documentation

## 2014-09-26 DIAGNOSIS — Z79899 Other long term (current) drug therapy: Secondary | ICD-10-CM

## 2014-09-26 DIAGNOSIS — B20 Human immunodeficiency virus [HIV] disease: Secondary | ICD-10-CM

## 2014-09-26 LAB — COMPREHENSIVE METABOLIC PANEL
ALK PHOS: 145 U/L — AB (ref 39–117)
ALT: 71 U/L — AB (ref 0–35)
AST: 108 U/L — ABNORMAL HIGH (ref 0–37)
Albumin: 3.9 g/dL (ref 3.5–5.2)
BILIRUBIN TOTAL: 0.3 mg/dL (ref 0.2–1.2)
BUN: 9 mg/dL (ref 6–23)
CHLORIDE: 107 meq/L (ref 96–112)
CO2: 20 meq/L (ref 19–32)
Calcium: 9 mg/dL (ref 8.4–10.5)
Creat: 0.82 mg/dL (ref 0.50–1.10)
Glucose, Bld: 82 mg/dL (ref 70–99)
POTASSIUM: 3.7 meq/L (ref 3.5–5.3)
Sodium: 141 mEq/L (ref 135–145)
Total Protein: 7.8 g/dL (ref 6.0–8.3)

## 2014-09-26 LAB — LIPID PANEL
CHOLESTEROL: 249 mg/dL — AB (ref 0–200)
HDL: 107 mg/dL (ref >=46)
LDL CALC: 121 mg/dL — AB (ref 0–99)
Total CHOL/HDL Ratio: 2.3 Ratio
Triglycerides: 105 mg/dL (ref <150)
VLDL: 21 mg/dL (ref 0–40)

## 2014-09-26 LAB — CBC
HCT: 34.5 % — ABNORMAL LOW (ref 36.0–46.0)
Hemoglobin: 12 g/dL (ref 12.0–15.0)
MCH: 34.1 pg — AB (ref 26.0–34.0)
MCHC: 34.8 g/dL (ref 30.0–36.0)
MCV: 98 fL (ref 78.0–100.0)
MPV: 8.9 fL (ref 8.6–12.4)
PLATELETS: 222 10*3/uL (ref 150–400)
RBC: 3.52 MIL/uL — ABNORMAL LOW (ref 3.87–5.11)
RDW: 13.8 % (ref 11.5–15.5)
WBC: 2.7 10*3/uL — AB (ref 4.0–10.5)

## 2014-09-27 LAB — T-HELPER CELL (CD4) - (RCID CLINIC ONLY)
CD4 T CELL HELPER: 61 % — AB (ref 33–55)
CD4 T Cell Abs: 920 /uL (ref 400–2700)

## 2014-09-27 LAB — RPR

## 2014-09-27 LAB — URINE CYTOLOGY ANCILLARY ONLY
CHLAMYDIA, DNA PROBE: NEGATIVE
Neisseria Gonorrhea: NEGATIVE

## 2014-09-28 LAB — HIV-1 RNA QUANT-NO REFLEX-BLD

## 2014-11-05 ENCOUNTER — Other Ambulatory Visit: Payer: Self-pay | Admitting: Infectious Disease

## 2014-11-07 ENCOUNTER — Ambulatory Visit (INDEPENDENT_AMBULATORY_CARE_PROVIDER_SITE_OTHER): Payer: PRIVATE HEALTH INSURANCE | Admitting: Infectious Diseases

## 2014-11-07 ENCOUNTER — Other Ambulatory Visit: Payer: Self-pay | Admitting: Infectious Diseases

## 2014-11-07 ENCOUNTER — Encounter: Payer: Self-pay | Admitting: Infectious Diseases

## 2014-11-07 VITALS — BP 107/76 | HR 83 | Temp 97.8°F | Ht 66.0 in | Wt 156.0 lb

## 2014-11-07 DIAGNOSIS — R079 Chest pain, unspecified: Secondary | ICD-10-CM | POA: Insufficient documentation

## 2014-11-07 DIAGNOSIS — I10 Essential (primary) hypertension: Secondary | ICD-10-CM

## 2014-11-07 DIAGNOSIS — E785 Hyperlipidemia, unspecified: Secondary | ICD-10-CM

## 2014-11-07 DIAGNOSIS — Z1231 Encounter for screening mammogram for malignant neoplasm of breast: Secondary | ICD-10-CM

## 2014-11-07 DIAGNOSIS — B20 Human immunodeficiency virus [HIV] disease: Secondary | ICD-10-CM

## 2014-11-07 DIAGNOSIS — Z23 Encounter for immunization: Secondary | ICD-10-CM

## 2014-11-07 NOTE — Assessment & Plan Note (Signed)
Encouraged diet and exercise.  

## 2014-11-07 NOTE — Assessment & Plan Note (Signed)
Etiology unclear, will have her sesn by CV. She has 3 risk factors- HIV, HTN, lipids

## 2014-11-07 NOTE — Assessment & Plan Note (Signed)
She is at goal.

## 2014-11-07 NOTE — Assessment & Plan Note (Signed)
She is doing well.  Will get pap/mammo Given condoms.  Will see her back in 6 months.  Consider updated ART at that time (descovy? Complera?)

## 2014-11-07 NOTE — Progress Notes (Signed)
   Subjective:    Patient ID: Vicki Brown, female    DOB: 1960-06-17, 54 y.o.   MRN: [id number]  HPI 54 yo F with HIV+ since 3-09. Has been on atripla. Had normal PAP 05-26-13, mammogram in June 2014. Has been taking lisinopril-hctz for her bp. Has had no problems with her ART. Does a lot of walking at work, no specific exercise. Has no specific diet  Except no pork. Has had trouble sleeping.   HIV 1 RNA QUANT (copies/mL)  Date Value  09/26/2014 <20  05/30/2014 <20  12/27/2013 <20   CD4 T CELL ABS (/uL)  Date Value  09/26/2014 920  05/30/2014 720  12/27/2013 870   FHx- no CAD. Reviewed in EPIC and with pt  Review of Systems  Constitutional: Negative for appetite change and unexpected weight change.  Gastrointestinal: Negative for diarrhea and constipation.  Genitourinary: Negative for difficulty urinating and menstrual problem.  Psychiatric/Behavioral: Positive for sleep disturbance.  has had occas tightness in her chest- last episode ~ 10 days ago. No change with cough or deep breathing, no diaphoresis, no radiation, no nausea.  12 point ros is o/w negative.  Mammo/PAP scheduled for 11-15-14 and 11-09-14.      Objective:   Physical Exam  Constitutional: She appears well-developed and well-nourished.  HENT:  Mouth/Throat: No oropharyngeal exudate.  Eyes: EOM are normal. Pupils are equal, round, and reactive to light.  Neck: Neck supple.  Cardiovascular: Normal rate, regular rhythm and normal heart sounds.   Pulmonary/Chest: Effort normal and breath sounds normal. She exhibits no tenderness.  Abdominal: Soft. Bowel sounds are normal. There is no tenderness. There is no rebound.  Musculoskeletal: She exhibits no edema.  Lymphadenopathy:    She has no cervical adenopathy.       Assessment & Plan:

## 2014-11-08 ENCOUNTER — Telehealth: Payer: Self-pay | Admitting: *Deleted

## 2014-11-08 NOTE — Telephone Encounter (Signed)
Patient is active with Cardiology, can call and make an appointment without referral.  Patient notified, is unable to write down the phone number right now. [phone]) She will call [facility] back for their phone number once she is back home. Landis Gandy, RN

## 2014-11-09 ENCOUNTER — Ambulatory Visit (INDEPENDENT_AMBULATORY_CARE_PROVIDER_SITE_OTHER): Payer: Self-pay | Admitting: *Deleted

## 2014-11-09 DIAGNOSIS — Z113 Encounter for screening for infections with a predominantly sexual mode of transmission: Secondary | ICD-10-CM

## 2014-11-09 DIAGNOSIS — Z124 Encounter for screening for malignant neoplasm of cervix: Secondary | ICD-10-CM

## 2014-11-09 NOTE — Progress Notes (Signed)
  Subjective:     Vicki Brown is a 54 y.o. woman who comes in today for a  pap smear only. Previous abnormal Pap smears: no. Contraception: condoms  Objective:    There were no vitals taken for this visit. Pelvic Exam:  Pap smear obtained.   Assessment:    Screening pap smear.   Plan:    Follow up in one year, or as indicated by Pap results.  Pt given educational materials re: HIV and women, self-esteem, BSE, nutrition and diet management, PAP smears and partner safety. Pt given condoms

## 2014-11-09 NOTE — Patient Instructions (Signed)
Your results will be ready in about a week.  I will mail them to you.  Thank you for coming to the Center for your care.  Austan Nicholl,  RN 

## 2014-11-13 LAB — CERVICOVAGINAL ANCILLARY ONLY
Chlamydia: NEGATIVE
NEISSERIA GONORRHEA: NEGATIVE
TRICH (WINDOWPATH): POSITIVE — AB

## 2014-11-14 ENCOUNTER — Telehealth: Payer: Self-pay | Admitting: *Deleted

## 2014-11-14 DIAGNOSIS — A599 Trichomoniasis, unspecified: Secondary | ICD-10-CM

## 2014-11-14 LAB — CYTOLOGY - PAP

## 2014-11-14 MED ORDER — METRONIDAZOLE 500 MG PO TABS: 2000.0000 mg | ORAL_TABLET | Freq: Once | ORAL | Status: DC

## 2014-11-14 NOTE — Telephone Encounter (Signed)
Patient notified, advised to inform her partners they should be tested/treated.  Advised to avoid alcohol with this medication. Sent to her preferred pharmacy. Landis Gandy, RN

## 2014-11-14 NOTE — Telephone Encounter (Signed)
-----   Message from Campbell Riches, MD sent at 11/14/2014  1:30 PM EDT ----- Please give patient flagyl 2grams po x 1.  thanks ----- Message -----    From: [facility]    Sent: 11/13/2014   3:52 PM      To: Campbell Riches, MD

## 2014-11-15 ENCOUNTER — Ambulatory Visit
Admission: RE | Admit: 2014-11-15 | Discharge: 2014-11-15 | Disposition: A | Discharge status: Home / Self Care | Payer: PRIVATE HEALTH INSURANCE | Source: Ambulatory Visit | Attending: Infectious Diseases | Admitting: Infectious Diseases

## 2014-11-15 DIAGNOSIS — Z1231 Encounter for screening mammogram for malignant neoplasm of breast: Secondary | ICD-10-CM

## 2014-11-16 ENCOUNTER — Encounter: Payer: Self-pay | Admitting: *Deleted

## 2014-11-18 ENCOUNTER — Other Ambulatory Visit: Payer: Self-pay | Admitting: Infectious Diseases

## 2014-11-21 ENCOUNTER — Telehealth: Payer: Self-pay | Admitting: *Deleted

## 2014-11-21 NOTE — Telephone Encounter (Signed)
Patient called and advises she is sick from the Flagyl (2 Grams) she was given last week. She advised she took it on Sunday 11/18/14 and has felt bad since. She advised she drank a beer today and made it worse. Advised her not to drink, any alcohol, for at least 1 week and to drink plenty of water and if she is not feeling better to call is Friday 11/23/14.

## 2014-11-23 ENCOUNTER — Other Ambulatory Visit: Payer: Self-pay | Admitting: Infectious Diseases

## 2014-11-27 ENCOUNTER — Telehealth: Payer: Self-pay | Admitting: *Deleted

## 2014-11-27 NOTE — Telephone Encounter (Signed)
Patient calling, complaining of cold-like symptoms including post nasal drip, congestion, the beginning of a cough since Saturday. She denies fever.  Patient is advised to try over the counter measures for symptoms (humidifier, fluids, saline nasal spray, ibuprofen/tylenol for congestion and pain, decongestant to help with drainage). She is to call back in a few days if this is ineffective.  RN advised patient that if it is a cold, symptoms usually last 7-10 days and antibiotics are ineffective.  Patient is to call back if she develops a fever or if her treatment is ineffective.  Pt verbalized understanding, agreement. Landis Gandy, RN

## 2014-12-11 ENCOUNTER — Telehealth: Payer: Self-pay | Admitting: Infectious Diseases

## 2014-12-11 NOTE — Telephone Encounter (Signed)
Patient was diagnosed with Trichomonas on 9/7 and treated with Flagyl. Patient states that she still has discharge with slight abdominal pain and has not been sexually active since she was treated. Patient denies fever, or vaginal odor. Please advise

## 2014-12-12 NOTE — Telephone Encounter (Signed)
Patient is uninsured, can she come here for vaginal exam?

## 2014-12-12 NOTE — Telephone Encounter (Signed)
If she can find a opneing

## 2014-12-12 NOTE — Telephone Encounter (Signed)
Pt needs exam, GYN

## 2014-12-19 ENCOUNTER — Ambulatory Visit (INDEPENDENT_AMBULATORY_CARE_PROVIDER_SITE_OTHER): Payer: Self-pay | Admitting: Infectious Disease

## 2014-12-19 ENCOUNTER — Encounter: Payer: Self-pay | Admitting: Infectious Disease

## 2014-12-19 ENCOUNTER — Telehealth: Payer: Self-pay | Admitting: *Deleted

## 2014-12-19 VITALS — BP 138/88 | HR 77 | Temp 98.4°F

## 2014-12-19 DIAGNOSIS — A599 Trichomoniasis, unspecified: Secondary | ICD-10-CM

## 2014-12-19 DIAGNOSIS — N898 Other specified noninflammatory disorders of vagina: Secondary | ICD-10-CM

## 2014-12-19 DIAGNOSIS — B20 Human immunodeficiency virus [HIV] disease: Secondary | ICD-10-CM

## 2014-12-19 DIAGNOSIS — B379 Candidiasis, unspecified: Secondary | ICD-10-CM | POA: Insufficient documentation

## 2014-12-19 DIAGNOSIS — G4700 Insomnia, unspecified: Secondary | ICD-10-CM

## 2014-12-19 HISTORY — DX: Insomnia, unspecified: G47.00

## 2014-12-19 MED ORDER — FLUCONAZOLE 100 MG PO TABS: 100.0000 mg | ORAL_TABLET | Freq: Every day | ORAL | Status: DC

## 2014-12-19 NOTE — Telephone Encounter (Signed)
Patient in office for work-in visit with Dr. Tommy Medal.  As this RN was discharging the patient, she mentioned that she has been taking Nyquil to help her sleep.  She asked what might be a better option, as she heard that it's not good to take Nyquil on a regular basis. RN agreed that nightly Nyquil is not a good idea, that she should discuss safe over-the-counter alternatives with the pharmacist when she goes to [facility] to pick up her diflucan today.  RN also  discussed good sleep hygiene.   Patient wanted to know if she could speak with Dr. Johnnye Sima about any potential changes in therapy (ARV or adding a PRN sleep medication) to help her sleep. Patient is scheduled to see Dr. Johnnye Sima 02/27/15. Landis Gandy, RN

## 2014-12-19 NOTE — Progress Notes (Signed)
Chief complaint vaginal discharge: Subjective:    Patient ID: Vicki Brown, female    DOB: February 25, 1961, 54 y.o.   MRN: [id number]  HPI  Ms. Demos is a 54 year old  American lady with HIV that has been perfectly controlled on her current antiretroviral regimen with an undetectable viral load and healthy CD4 count.  She had recent Pap smear that was positive for [facility] was treated with 2 g of metronidazole. However after that she has had worsening vaginal discharge and pruritis.   Past Medical History  Diagnosis Date  . HIV infection   . Hypertension     No past surgical history on file.  Family History  Problem Relation Age of Onset  . Cancer Mother     uterine vs ovvarian?  . Seizures Father   . Mental illness Brother   . Cancer Maternal Aunt     breast CA  . Diabetes Daughter       Social History   Social History  . Marital Status: Single    Spouse Name: N/A  . Number of Children: N/A  . Years of Education: N/A   Social History Main Topics  . Smoking status: Former Smoker    Types: Cigarettes  . Smokeless tobacco: Never Used  . Alcohol Use: 0.0 oz/week    0 Standard drinks or equivalent per week     Comment: rarely  . Drug Use: 1.00 per week     Comment: marijuana  . Sexual Activity:    Partners: Male     Comment: accepted condoms   Other Topics Concern  . Not on file   Social History Narrative    No Known Allergies   Current outpatient prescriptions:  .  ATRIPLA [id number] MG per tablet, TAKE 1 TABLET BY MOUTH EVERY NIGHT AT BEDTIME, Disp: 30 tablet, Rfl: 5 .  lisinopril-hydrochlorothiazide (PRINZIDE,ZESTORETIC) 10-12.5 MG per tablet, TAKE ONE TABLET BY MOUTH ONCE DAILY, Disp: 30 tablet, Rfl: 0 .  fluconazole (DIFLUCAN) 100 MG tablet, Take 1 tablet (100 mg total) by mouth daily., Disp: 10 tablet, Rfl: 1 .  ibuprofen (ADVIL,MOTRIN) 800 MG tablet, Take 1 tablet (800 mg total) by mouth 2 (two) times daily. (Patient not taking: Reported on  12/19/2014), Disp: 60 tablet, Rfl: 0 .  metroNIDAZOLE (FLAGYL) 500 MG tablet, Take 4 tablets (2,000 mg total) by mouth once. (Patient not taking: Reported on 12/19/2014), Disp: 4 tablet, Rfl: 0 .  polyethylene glycol (MIRALAX / GLYCOLAX) packet, Take 17 g by mouth daily. (Patient not taking: Reported on 12/19/2014), Disp: 14 each, Rfl: 0    Review of Systems  Constitutional: Negative for fever, chills, diaphoresis, activity change, appetite change, fatigue and unexpected weight change.  HENT: Negative for congestion, rhinorrhea, sinus pressure, sneezing, sore throat and trouble swallowing.   Eyes: Negative for photophobia and visual disturbance.  Respiratory: Negative for cough, chest tightness, shortness of breath, wheezing and stridor.   Cardiovascular: Negative for chest pain, palpitations and leg swelling.  Gastrointestinal: Negative for nausea, vomiting, abdominal pain, diarrhea, constipation, blood in stool, abdominal distention and anal bleeding.  Genitourinary: Positive for vaginal discharge. Negative for dysuria, hematuria, flank pain and difficulty urinating.  Musculoskeletal: Negative for myalgias, back pain, joint swelling, arthralgias and gait problem.  Skin: Negative for color change, pallor, rash and wound.  Neurological: Negative for dizziness, tremors, weakness and light-headedness.  Hematological: Negative for adenopathy. Does not bruise/bleed easily.  Psychiatric/Behavioral: Negative for behavioral problems, confusion, sleep disturbance, dysphoric mood, decreased concentration and agitation.  Objective:   Physical Exam  Constitutional: She is oriented to person, place, and time. She appears well-developed and well-nourished. No distress.  HENT:  Head: Normocephalic and atraumatic.  Mouth/Throat: No oropharyngeal exudate.  Eyes: Conjunctivae and EOM are normal. No scleral icterus.  Neck: Normal range of motion. Neck supple.  Cardiovascular: Normal rate and regular  rhythm.   Pulmonary/Chest: Effort normal. No respiratory distress. She has no wheezes.  Abdominal: She exhibits no distension.  Genitourinary: Vagina normal. Cervix exhibits discharge and friability.  Musculoskeletal: She exhibits no edema or tenderness.  Neurological: She is alert and oriented to person, place, and time. She exhibits normal muscle tone. Coordination normal.  Skin: Skin is warm and dry. No rash noted. She is not diaphoretic. No erythema. No pallor.  Psychiatric: She has a normal mood and affect. Her behavior is normal. Judgment and thought content normal.          Assessment & Plan:   New Vaginal discharge: A performed pelvic exam which disclosed milky discharge from the endocervix. I swabbed this for GC chlamydia and Trichomonas as well as yeast. It appears to me consistent with a yeast infection I will give her fluconazole for this.  Trichomonas: Treated should not of recurred since she has not had new sexual activity since treatment  HIV: Well controlled on current regimen but would consider a TAF based regimen. Also apparently the patient in suffering from insomnia which may be another reason to move away from Efavirenz. To followup with Dr. Johnnye Sima

## 2014-12-20 LAB — CYTOLOGY, (ORAL, ANAL, URETHRAL) ANCILLARY ONLY
Chlamydia: NEGATIVE
Neisseria Gonorrhea: NEGATIVE
Trichomonas: POSITIVE — AB

## 2014-12-21 ENCOUNTER — Telehealth: Payer: Self-pay | Admitting: Infectious Disease

## 2014-12-21 ENCOUNTER — Other Ambulatory Visit: Payer: Self-pay | Admitting: Infectious Disease

## 2014-12-21 DIAGNOSIS — A599 Trichomoniasis, unspecified: Secondary | ICD-10-CM

## 2014-12-21 MED ORDER — METRONIDAZOLE 500 MG PO TABS: 500.0000 mg | ORAL_TABLET | Freq: Two times a day (BID) | ORAL | Status: DC

## 2014-12-21 NOTE — Telephone Encounter (Signed)
Thanks Tamika. 

## 2014-12-21 NOTE — Telephone Encounter (Signed)
-----   Message from Truman Hayward, MD sent at 12/20/2014  6:18 PM EDT ----- Vicki Brown has + trichomonas again. She can either take 2 grams of flagyl once (4 x 500mg ) or take 500mg  twice daily for 7 days. Hopefully this is not a metronidazole R strain

## 2014-12-21 NOTE — Telephone Encounter (Signed)
Called patient and left her a message to give me a call so I can see what option she wants to take regarding medication.

## 2014-12-24 LAB — CERVICOVAGINAL ANCILLARY ONLY: CANDIDA VAGINITIS: NEGATIVE

## 2014-12-24 NOTE — Progress Notes (Signed)
Lets do flagyl 2 g daily x 5 days. I believe is 4 tablets at once every day for 5 days

## 2014-12-25 NOTE — Progress Notes (Signed)
It is metronidazole 500mg  take FOUR tablet daily for FIVE days (no alcohol for 2 days prior and days after taking this)  She will need 20 pills with zero refill

## 2014-12-29 ENCOUNTER — Other Ambulatory Visit: Payer: Self-pay | Admitting: Infectious Diseases

## 2015-02-27 ENCOUNTER — Ambulatory Visit (INDEPENDENT_AMBULATORY_CARE_PROVIDER_SITE_OTHER): Payer: Self-pay | Admitting: Infectious Diseases

## 2015-02-27 ENCOUNTER — Encounter: Payer: Self-pay | Admitting: Infectious Diseases

## 2015-02-27 VITALS — BP 153/90 | HR 84 | Temp 98.6°F | Wt 161.0 lb

## 2015-02-27 DIAGNOSIS — Z113 Encounter for screening for infections with a predominantly sexual mode of transmission: Secondary | ICD-10-CM

## 2015-02-27 DIAGNOSIS — B20 Human immunodeficiency virus [HIV] disease: Secondary | ICD-10-CM

## 2015-02-27 DIAGNOSIS — E785 Hyperlipidemia, unspecified: Secondary | ICD-10-CM

## 2015-02-27 DIAGNOSIS — I10 Essential (primary) hypertension: Secondary | ICD-10-CM

## 2015-02-27 DIAGNOSIS — Z79899 Other long term (current) drug therapy: Secondary | ICD-10-CM

## 2015-02-27 DIAGNOSIS — G4700 Insomnia, unspecified: Secondary | ICD-10-CM

## 2015-02-27 LAB — COMPLETE METABOLIC PANEL WITH GFR
ALBUMIN: 3.9 g/dL (ref 3.6–5.1)
ALK PHOS: 126 U/L (ref 33–130)
ALT: 45 U/L — AB (ref 6–29)
AST: 69 U/L — AB (ref 10–35)
BILIRUBIN TOTAL: 0.3 mg/dL (ref 0.2–1.2)
BUN: 11 mg/dL (ref 7–25)
CALCIUM: 8.9 mg/dL (ref 8.6–10.4)
CO2: 25 mmol/L (ref 20–31)
CREATININE: 0.82 mg/dL (ref 0.50–1.05)
Chloride: 105 mmol/L (ref 98–110)
GFR, Est African American: >89 mL/min (ref >=60)
GFR, Est Non African American: 80 mL/min (ref >=60)
GLUCOSE: 98 mg/dL (ref 65–99)
Potassium: 4 mmol/L (ref 3.5–5.3)
SODIUM: 137 mmol/L (ref 135–146)
TOTAL PROTEIN: 7.3 g/dL (ref 6.1–8.1)

## 2015-02-27 LAB — CBC
HEMATOCRIT: 32.5 % — AB (ref 36.0–46.0)
HEMOGLOBIN: 11.3 g/dL — AB (ref 12.0–15.0)
MCH: 34 pg (ref 26.0–34.0)
MCHC: 34.8 g/dL (ref 30.0–36.0)
MCV: 97.9 fL (ref 78.0–100.0)
MPV: 9 fL (ref 8.6–12.4)
Platelets: 204 10*3/uL (ref 150–400)
RBC: 3.32 MIL/uL — AB (ref 3.87–5.11)
RDW: 14.4 % (ref 11.5–15.5)
WBC: 3.8 10*3/uL — AB (ref 4.0–10.5)

## 2015-02-27 LAB — LIPID PANEL
CHOL/HDL RATIO: 2.3 ratio (ref <=5.0)
CHOLESTEROL: 214 mg/dL — AB (ref 125–200)
HDL: 93 mg/dL (ref >=46)
LDL Cholesterol: 97 mg/dL (ref <130)
Triglycerides: 118 mg/dL (ref <150)
VLDL: 24 mg/dL (ref <30)

## 2015-02-27 MED ORDER — LISINOPRIL-HYDROCHLOROTHIAZIDE 10-12.5 MG PO TABS: 1.0000 | ORAL_TABLET | Freq: Every day | ORAL | Status: DC

## 2015-02-27 MED ORDER — EMTRICITAB-RILPIVIR-TENOFOV AF 200-25-25 MG PO TABS: 1.0000 | ORAL_TABLET | Freq: Every day | ORAL | Status: DC

## 2015-02-27 NOTE — Assessment & Plan Note (Signed)
Will change her art to see of this helps

## 2015-02-27 NOTE — Progress Notes (Signed)
   Subjective:    Patient ID: Vicki Brown, female    DOB: 10/19/1960, 54 y.o.   MRN: [id number]  HPI 54 yo F with HIV+ since 3-09. Has been on atripla. Had normal PAP 05-26-13, 11-2014, mammogram nl 11-2014. Has been taking lisinopril-hctz for her bp. Has been seen recently for Trich, BV, yeast.  Still feels like she had vaginal d/c.  Did not take BP rx this AM.   HIV 1 RNA QUANT (copies/mL)  Date Value  09/26/2014 <20  05/30/2014 <20  12/27/2013 <20   CD4 T CELL ABS (/uL)  Date Value  09/26/2014 920  05/30/2014 720  12/27/2013 870   Review of Systems  Constitutional: Negative for appetite change and unexpected weight change.  Respiratory: Negative for shortness of breath.   Cardiovascular: Negative for chest pain.  Gastrointestinal: Negative for diarrhea and constipation.  Genitourinary: Negative for difficulty urinating and menstrual problem.  Neurological: Negative for dizziness.  ammenorrheic     Objective:   Physical Exam  Constitutional: She appears well-developed and well-nourished.  HENT:  Mouth/Throat: No oropharyngeal exudate.  Eyes: EOM are normal. Pupils are equal, round, and reactive to light.  Neck: Neck supple.  Cardiovascular: Normal rate, regular rhythm and normal heart sounds.   Pulmonary/Chest: Effort normal and breath sounds normal.  Abdominal: Soft. Bowel sounds are normal. There is no tenderness. There is no rebound.  Lymphadenopathy:    She has no cervical adenopathy.      Assessment & Plan:

## 2015-02-27 NOTE — Assessment & Plan Note (Signed)
Will change her to odefsy Given condoms Check labs today rtc in 4 months.

## 2015-02-27 NOTE — Assessment & Plan Note (Signed)
Elevated today- did not take rx this am.  Encourage adherence.  Will change her dose if remains elevated in furture

## 2015-02-27 NOTE — Addendum Note (Signed)
Addended by: Dolan Amen D on: 02/27/2015 03:27 PM   Modules accepted: Orders

## 2015-02-27 NOTE — Assessment & Plan Note (Signed)
Will repeat   Lab Results  Component Value Date   CHOL 249* 09/26/2014   HDL 107 09/26/2014   LDLCALC 121* 09/26/2014   TRIG 105 09/26/2014   CHOLHDL 2.3 09/26/2014

## 2015-02-28 ENCOUNTER — Telehealth: Payer: Self-pay | Admitting: *Deleted

## 2015-02-28 LAB — RPR

## 2015-02-28 LAB — T-HELPER CELL (CD4) - (RCID CLINIC ONLY)
CD4 T CELL ABS: 1150 /uL (ref 400–2700)
CD4 T CELL HELPER: 62 % — AB (ref 33–55)

## 2015-02-28 NOTE — Telephone Encounter (Signed)
Referral made to Gyn uninsured program. Left Rolena Infante a voice mail and also sent her a staff message to schedule patient at the next outpatient clinic. Myrtis Hopping

## 2015-03-01 LAB — HIV-1 RNA QUANT-NO REFLEX-BLD
HIV 1 RNA Quant: <20 {copies}/mL
HIV-1 RNA Quant, Log: <1.3 {Log_copies}/mL

## 2015-03-05 NOTE — Telephone Encounter (Signed)
Patient notified she is too early for pap, not due until September. [facility] does not see uninsured patients. She was [facility] if still having an issue with vaginal discharge. She was given the phone number and address.

## 2015-03-12 ENCOUNTER — Telehealth: Payer: Self-pay | Admitting: Infectious Diseases

## 2015-03-12 NOTE — Telephone Encounter (Signed)
Pt called medication assistance line asking about her HIV medication, wants to know why it was changed from [place] to [place]?

## 2015-03-12 NOTE — Telephone Encounter (Signed)
Spoke with patient, explained how/when to take new medication, what to avoid.

## 2015-04-22 ENCOUNTER — Telehealth: Payer: Self-pay | Admitting: *Deleted

## 2015-04-22 DIAGNOSIS — B20 Human immunodeficiency virus [HIV] disease: Secondary | ICD-10-CM

## 2015-04-22 MED ORDER — EFAVIRENZ-EMTRICITAB-TENOFOVIR 600-200-300 MG PO TABS: 1.0000 | ORAL_TABLET | Freq: Every day | ORAL | Status: DC

## 2015-04-22 NOTE — Telephone Encounter (Signed)
Patient called and advised that since she started taking the [facility] she has had facial swelling, itching all over and bad stomach aches. She advised she wants to go back on her atripla. Advised her will let the doctor know and get back to her when he responds.

## 2015-04-22 NOTE — Telephone Encounter (Signed)
Ok to resume atripla thanks

## 2015-04-22 NOTE — Telephone Encounter (Addendum)
Patient called and the Odefsy stopped. Atripla added back. Patient notified.

## 2015-04-22 NOTE — Addendum Note (Signed)
Addended by: Reggy Eye on: 04/22/2015 03:47 PM   Modules accepted: Orders, Medications

## 2015-06-05 ENCOUNTER — Inpatient Hospital Stay (HOSPITAL_COMMUNITY)
Admission: AD | Admit: 2015-06-05 | Discharge: 2015-06-05 | Disposition: A | Discharge status: Home / Self Care | Payer: BLUE CROSS/BLUE SHIELD | Source: Ambulatory Visit | Attending: Obstetrics & Gynecology | Admitting: Obstetrics & Gynecology

## 2015-06-05 ENCOUNTER — Encounter (HOSPITAL_COMMUNITY): Payer: Self-pay

## 2015-06-05 DIAGNOSIS — Z780 Asymptomatic menopausal state: Secondary | ICD-10-CM | POA: Diagnosis not present

## 2015-06-05 DIAGNOSIS — Z21 Asymptomatic human immunodeficiency virus [HIV] infection status: Secondary | ICD-10-CM | POA: Insufficient documentation

## 2015-06-05 DIAGNOSIS — Z79899 Other long term (current) drug therapy: Secondary | ICD-10-CM | POA: Diagnosis not present

## 2015-06-05 DIAGNOSIS — A499 Bacterial infection, unspecified: Secondary | ICD-10-CM | POA: Diagnosis not present

## 2015-06-05 DIAGNOSIS — N760 Acute vaginitis: Secondary | ICD-10-CM | POA: Diagnosis not present

## 2015-06-05 DIAGNOSIS — Z87891 Personal history of nicotine dependence: Secondary | ICD-10-CM | POA: Diagnosis not present

## 2015-06-05 DIAGNOSIS — R0602 Shortness of breath: Secondary | ICD-10-CM | POA: Diagnosis not present

## 2015-06-05 DIAGNOSIS — R102 Pelvic and perineal pain: Secondary | ICD-10-CM

## 2015-06-05 DIAGNOSIS — M549 Dorsalgia, unspecified: Secondary | ICD-10-CM | POA: Diagnosis not present

## 2015-06-05 DIAGNOSIS — I10 Essential (primary) hypertension: Secondary | ICD-10-CM | POA: Insufficient documentation

## 2015-06-05 DIAGNOSIS — B9689 Other specified bacterial agents as the cause of diseases classified elsewhere: Secondary | ICD-10-CM

## 2015-06-05 LAB — CBC
HCT: 35.6 % — ABNORMAL LOW (ref 36.0–46.0)
HEMOGLOBIN: 12.4 g/dL (ref 12.0–15.0)
MCH: 34.4 pg — ABNORMAL HIGH (ref 26.0–34.0)
MCHC: 34.8 g/dL (ref 30.0–36.0)
MCV: 98.9 fL (ref 78.0–100.0)
Platelets: 210 10*3/uL (ref 150–400)
RBC: 3.6 MIL/uL — AB (ref 3.87–5.11)
RDW: 12.8 % (ref 11.5–15.5)
WBC: 3.4 10*3/uL — AB (ref 4.0–10.5)

## 2015-06-05 LAB — URINE MICROSCOPIC-ADD ON

## 2015-06-05 LAB — WET PREP, GENITAL
SPERM: NONE SEEN
TRICH WET PREP: NONE SEEN
YEAST WET PREP: NONE SEEN

## 2015-06-05 LAB — URINALYSIS, ROUTINE W REFLEX MICROSCOPIC
Bilirubin Urine: NEGATIVE
GLUCOSE, UA: NEGATIVE mg/dL
Ketones, ur: NEGATIVE mg/dL
Nitrite: NEGATIVE
PH: 5.5 (ref 5.0–8.0)
Protein, ur: NEGATIVE mg/dL
SPECIFIC GRAVITY, URINE: 1.025 (ref 1.005–1.030)

## 2015-06-05 MED ORDER — METRONIDAZOLE 500 MG PO TABS: 500.0000 mg | ORAL_TABLET | Freq: Two times a day (BID) | ORAL | Status: AC

## 2015-06-05 NOTE — MAU Note (Addendum)
Been hurting in lower abd, low back.  Knee feels like it is going to give out. Has been short of breath.  Been going on a week or so.  Some vomiting at times, "when she eats, food gets stuck"; had had diarrhea off an on the past wk, maybe twice a day.

## 2015-06-05 NOTE — MAU Provider Note (Signed)
Chief Complaint: Abdominal Pain; Back Pain; and Shortness of Breath   First Provider Initiated Contact with Patient 06/05/15 1533      SUBJECTIVE HPI: Vicki Brown is a 55 y.o. G2P0 postmenopausal pt, HIV positive since 2009 on antiviral medications who presents to maternity admissions reporting pelvic pain x 2 weeks.  She reports nausea with vomiting sometimes after eating, but this is unchanged in the last 2 weeks.  She has a primary care doctor who does routine Gyn care and had a normal Pap in 2016.  She is taking her HIV and BP medications as prescribed.  She had diagnosis of trichomonas vaginitis in 2016 but denies known exposure to STDs recently.  She denies vaginal bleeding, vaginal itching/burning, urinary symptoms, h/a, dizziness, or fever/chills.      HPI  Past Medical History  Diagnosis Date  . HIV infection ([place])   . Hypertension   . Trichomonas infection 12/19/2014  . Yeast infection 12/19/2014  . Insomnia 12/19/2014   Past Surgical History  Procedure Laterality Date  . Tubal ligation     Social History   Social History  . Marital Status: Single    Spouse Name: N/A  . Number of Children: N/A  . Years of Education: N/A   Occupational History  . Not on file.   Social History Main Topics  . Smoking status: Former Smoker    Types: Cigarettes  . Smokeless tobacco: Never Used  . Alcohol Use: 0.0 oz/week    0 Standard drinks or equivalent per week     Comment: rarely  . Drug Use: 1.00 per week     Comment: marijuana  . Sexual Activity:    Partners: Male     Comment: accepted condoms   Other Topics Concern  . Not on file   Social History Narrative   No current facility-administered medications on file prior to encounter.   Current Outpatient Prescriptions on File Prior to Encounter  Medication Sig Dispense Refill  . efavirenz-emtricitabine-tenofovir (ATRIPLA) 600-200-300 MG tablet Take 1 tablet by mouth at bedtime. 30 tablet 5  .  lisinopril-hydrochlorothiazide (ZESTORETIC) 10-12.5 MG tablet Take 1 tablet by mouth daily. 90 tablet 3   No Known Allergies  ROS:  Review of Systems  Constitutional: Negative for fever, chills and fatigue.  Respiratory: Negative for shortness of breath.   Cardiovascular: Negative for chest pain.  Gastrointestinal: Positive for nausea and vomiting.  Genitourinary: Positive for pelvic pain. Negative for dysuria, flank pain, vaginal bleeding, vaginal discharge, difficulty urinating and vaginal pain.  Neurological: Negative for dizziness and headaches.  Psychiatric/Behavioral: Negative.      I have reviewed patient's Past Medical Hx, Surgical Hx, Family Hx, Social Hx, medications and allergies.   Physical Exam   Patient Vitals for the past 24 hrs:  BP Temp Temp src Pulse Resp SpO2 Weight  06/05/15 1657 148/76 mmHg 97.5 F (36.4 C) Oral 65 17 - -  06/05/15 1336 - - - - - 100 % -  06/05/15 1334 133/85 mmHg 98.8 F (37.1 C) Oral 70 18 - 70.761 kg (156 lb)   Constitutional: Well-developed, well-nourished female in no acute distress.  Cardiovascular: normal rate Respiratory: normal effort GI: Abd soft, non-tender. No rebound tenderness or guarding.  Pos BS x 4 MS: Extremities nontender, no edema, normal ROM Neurologic: Alert and oriented x 4.  GU: Neg CVAT.  PELVIC EXAM: Cervix pink, visually closed, without lesion, scant white creamy discharge, vaginal walls and external genitalia normal Bimanual exam: Cervix 0/long/high, firm,  anterior, neg CMT, uterus nontender, nonenlarged, adnexa without tenderness, enlargement, or mass   LAB RESULTS Results for orders placed or performed during the hospital encounter of 06/05/15 (from the past 24 hour(s))  Urinalysis, Routine w reflex microscopic (not at [facility])     Status: Abnormal   Collection Time: 06/05/15  1:40 PM  Result Value Ref Range   Color, Urine YELLOW YELLOW   APPearance CLEAR CLEAR   Specific Gravity, Urine 1.025 1.005 - 1.030    pH 5.5 5.0 - 8.0   Glucose, UA NEGATIVE NEGATIVE mg/dL   Hgb urine dipstick TRACE (A) NEGATIVE   Bilirubin Urine NEGATIVE NEGATIVE   Ketones, ur NEGATIVE NEGATIVE mg/dL   Protein, ur NEGATIVE NEGATIVE mg/dL   Nitrite NEGATIVE NEGATIVE   Leukocytes, UA TRACE (A) NEGATIVE  Urine microscopic-add on     Status: Abnormal   Collection Time: 06/05/15  1:40 PM  Result Value Ref Range   Squamous Epithelial / LPF 0-5 (A) NONE SEEN   WBC, UA 0-5 0 - 5 WBC/hpf   RBC / HPF 0-5 0 - 5 RBC/hpf   Bacteria, UA MANY (A) NONE SEEN   Urine-Other MUCOUS PRESENT   Wet prep, genital     Status: Abnormal   Collection Time: 06/05/15  3:41 PM  Result Value Ref Range   Yeast Wet Prep HPF POC NONE SEEN NONE SEEN   Trich, Wet Prep NONE SEEN NONE SEEN   Clue Cells Wet Prep HPF POC PRESENT (A) NONE SEEN   WBC, Wet Prep HPF POC FEW (A) NONE SEEN   Sperm NONE SEEN   CBC     Status: Abnormal   Collection Time: 06/05/15  3:46 PM  Result Value Ref Range   WBC 3.4 (L) 4.0 - 10.5 K/uL   RBC 3.60 (L) 3.87 - 5.11 MIL/uL   Hemoglobin 12.4 12.0 - 15.0 g/dL   HCT 35.6 (L) 36.0 - 46.0 %   MCV 98.9 78.0 - 100.0 fL   MCH 34.4 (H) 26.0 - 34.0 pg   MCHC 34.8 30.0 - 36.0 g/dL   RDW 12.8 11.5 - 15.5 %   Platelets 210 150 - 400 K/uL       IMAGING No results found.  MAU Management/MDM: Ordered labs and reviewed results.  No evidence of acute abdomen or pelvic infection.  Will treat for BV with positive wet prep and encourage pt to f/u with primary care provider if symptoms persist or worsen.  Pt stable at time of discharge.  ASSESSMENT 1. Pelvic pain in female   2. BV (bacterial vaginosis)     PLAN Discharge home Flagyl 500 mg BID x 7 days    Medication List    TAKE these medications        efavirenz-emtricitabine-tenofovir 600-200-300 MG tablet  Commonly known as:  ATRIPLA  Take 1 tablet by mouth at bedtime.     lisinopril-hydrochlorothiazide 10-12.5 MG tablet  Commonly known as:  ZESTORETIC  Take 1  tablet by mouth daily.     metroNIDAZOLE 500 MG tablet  Commonly known as:  FLAGYL  Take 1 tablet (500 mg total) by mouth 2 (two) times daily.       Follow-up Information    Follow up with Bobby Rumpf, MD.   Specialty:  Infectious Diseases   Why:  Return to MAU as needed for emergencies   Contact information:   [address] [place] [address] [phone]       Fatima Blank Certified Nurse-Midwife  06/05/2015  5:01 PM

## 2015-06-05 NOTE — Discharge Instructions (Signed)
Bacterial Vaginosis Bacterial vaginosis is a vaginal infection that occurs when the normal balance of bacteria in the vagina is disrupted. It results from an overgrowth of certain bacteria. This is the most common vaginal infection in women of childbearing age. Treatment is important to prevent complications, especially in pregnant women, as it can cause a premature delivery. CAUSES  Bacterial vaginosis is caused by an increase in harmful bacteria that are normally present in smaller amounts in the vagina. Several different kinds of bacteria can cause bacterial vaginosis. However, the reason that the condition develops is not fully understood. RISK FACTORS Certain activities or behaviors can put you at an increased risk of developing bacterial vaginosis, including:  Having a new sex partner or multiple sex partners.  Douching.  Using an intrauterine device (IUD) for contraception. Women do not get bacterial vaginosis from toilet seats, bedding, swimming pools, or contact with objects around them. SIGNS AND SYMPTOMS  Some women with bacterial vaginosis have no signs or symptoms. Common symptoms include:  Grey vaginal discharge.  A fishlike odor with discharge, especially after sexual intercourse.  Itching or burning of the vagina and vulva.  Burning or pain with urination. DIAGNOSIS  Your health care provider will take a medical history and examine the vagina for signs of bacterial vaginosis. A sample of vaginal fluid may be taken. Your health care provider will look at this sample under a microscope to check for bacteria and abnormal cells. A vaginal pH test may also be done.  TREATMENT  Bacterial vaginosis may be treated with antibiotic medicines. These may be given in the form of a pill or a vaginal cream. A second round of antibiotics may be prescribed if the condition comes back after treatment. Because bacterial vaginosis increases your risk for sexually transmitted diseases, getting  treated can help reduce your risk for chlamydia, gonorrhea, HIV, and herpes. HOME CARE INSTRUCTIONS   Only take over-the-counter or prescription medicines as directed by your health care provider.  If antibiotic medicine was prescribed, take it as directed. Make sure you finish it even if you start to feel better.  Tell all sexual partners that you have a vaginal infection. They should see their health care provider and be treated if they have problems, such as a mild rash or itching.  During treatment, it is important that you follow these instructions:  Avoid sexual activity or use condoms correctly.  Do not douche.  Avoid alcohol as directed by your health care provider.  Avoid breastfeeding as directed by your health care provider. SEEK MEDICAL CARE IF:   Your symptoms are not improving after 3 days of treatment.  You have increased discharge or pain.  You have a fever. MAKE SURE YOU:   Understand these instructions.  Will watch your condition.  Will get help right away if you are not doing well or get worse. FOR MORE INFORMATION  Centers for Disease Control and Prevention, Division of STD Prevention: [employer]raiser[employer]raud.[employer] American Sexual Health Association (ASHA): www.ashastd.org    This information is not intended to replace advice given to you by your health care provider. Make sure you discuss any questions you have with your health care provider.   Document Released: 02/23/2005 Document Revised: 03/16/2014 Document Reviewed: 10/05/2012 Elsevier Interactive Patient Education 2016 Elsevier Inc. Pelvic Pain, Female Female pelvic pain can be caused by many different things and start from a variety of places. Pelvic pain refers to pain that is located in the lower half of the abdomen and between  your hips. The pain may occur over a short period of time (acute) or may be reoccurring (chronic). The cause of pelvic pain may be related to disorders affecting the female  reproductive organs (gynecologic), but it may also be related to the bladder, kidney stones, an intestinal complication, or muscle or skeletal problems. Getting help right away for pelvic pain is important, especially if there has been severe, sharp, or a sudden onset of unusual pain. It is also important to get help right away because some types of pelvic pain can be life threatening.  CAUSES  Below are only some of the causes of pelvic pain. The causes of pelvic pain can be in one of several categories.   Gynecologic.  Pelvic inflammatory disease.  Sexually transmitted infection.  Ovarian cyst or a twisted ovarian ligament (ovarian torsion).  Uterine lining that grows outside the uterus (endometriosis).  Fibroids, cysts, or tumors.  Ovulation.  Pregnancy.  Pregnancy that occurs outside the uterus (ectopic pregnancy).  Miscarriage.  Labor.  Abruption of the placenta or ruptured uterus.  Infection.  Uterine infection (endometritis).  Bladder infection.  Diverticulitis.  Miscarriage related to a uterine infection (septic abortion).  Bladder.  Inflammation of the bladder (cystitis).  Kidney stone(s).  Gastrointestinal.  Constipation.  Diverticulitis.  Neurologic.  Trauma.  Feeling pelvic pain because of mental or emotional causes (psychosomatic).  Cancers of the bowel or pelvis. EVALUATION  Your caregiver will want to take a careful history of your concerns. This includes recent changes in your health, a careful gynecologic history of your periods (menses), and a sexual history. Obtaining your family history and medical history is also important. Your caregiver may suggest a pelvic exam. A pelvic exam will help identify the location and severity of the pain. It also helps in the evaluation of which organ system may be involved. In order to identify the cause of the pelvic pain and be properly treated, your caregiver may order tests. These tests may include:    A pregnancy test.  Pelvic ultrasonography.  An X-ray exam of the abdomen.  A urinalysis or evaluation of vaginal discharge.  Blood tests. HOME CARE INSTRUCTIONS   Only take over-the-counter or prescription medicines for pain, discomfort, or fever as directed by your caregiver.   Rest as directed by your caregiver.   Eat a balanced diet.   Drink enough fluids to make your urine clear or pale yellow, or as directed.   Avoid sexual intercourse if it causes pain.   Apply warm or cold compresses to the lower abdomen depending on which one helps the pain.   Avoid stressful situations.   Keep a journal of your pelvic pain. Write down when it started, where the pain is located, and if there are things that seem to be associated with the pain, such as food or your menstrual cycle.  Follow up with your caregiver as directed.  SEEK MEDICAL CARE IF:  Your medicine does not help your pain.  You have abnormal vaginal discharge. SEEK IMMEDIATE MEDICAL CARE IF:   You have heavy bleeding from the vagina.   Your pelvic pain increases.   You feel light-headed or faint.   You have chills.   You have pain with urination or blood in your urine.   You have uncontrolled diarrhea or vomiting.   You have a fever or persistent symptoms for more than 3 days.  You have a fever and your symptoms suddenly get worse.   You are being physically or sexually  abused.   This information is not intended to replace advice given to you by your health care provider. Make sure you discuss any questions you have with your health care provider.   Document Released: 01/21/2004 Document Revised: 11/14/2014 Document Reviewed: 06/15/2011 Elsevier Interactive Patient Education [employer].

## 2015-06-06 LAB — GC/CHLAMYDIA PROBE AMP (~~LOC~~) NOT AT ARMC
CHLAMYDIA, DNA PROBE: NEGATIVE
NEISSERIA GONORRHEA: NEGATIVE

## 2015-06-06 LAB — URINE CULTURE: Culture: NO GROWTH

## 2015-06-19 ENCOUNTER — Other Ambulatory Visit: Payer: Self-pay

## 2015-06-20 ENCOUNTER — Other Ambulatory Visit: Payer: Self-pay

## 2015-07-03 ENCOUNTER — Ambulatory Visit (INDEPENDENT_AMBULATORY_CARE_PROVIDER_SITE_OTHER): Payer: BLUE CROSS/BLUE SHIELD | Admitting: Infectious Diseases

## 2015-07-03 ENCOUNTER — Encounter: Payer: Self-pay | Admitting: Infectious Diseases

## 2015-07-03 VITALS — BP 145/87 | HR 72 | Temp 98.6°F | Wt 155.0 lb

## 2015-07-03 DIAGNOSIS — Z79899 Other long term (current) drug therapy: Secondary | ICD-10-CM | POA: Diagnosis not present

## 2015-07-03 DIAGNOSIS — J302 Other seasonal allergic rhinitis: Secondary | ICD-10-CM

## 2015-07-03 DIAGNOSIS — M25531 Pain in right wrist: Secondary | ICD-10-CM | POA: Insufficient documentation

## 2015-07-03 DIAGNOSIS — B20 Human immunodeficiency virus [HIV] disease: Secondary | ICD-10-CM | POA: Diagnosis not present

## 2015-07-03 DIAGNOSIS — Z113 Encounter for screening for infections with a predominantly sexual mode of transmission: Secondary | ICD-10-CM

## 2015-07-03 DIAGNOSIS — I10 Essential (primary) hypertension: Secondary | ICD-10-CM | POA: Diagnosis not present

## 2015-07-03 LAB — LIPID PANEL
CHOL/HDL RATIO: 2.3 ratio (ref <=5.0)
CHOLESTEROL: 243 mg/dL — AB (ref 125–200)
HDL: 108 mg/dL (ref >=46)
LDL Cholesterol: 120 mg/dL (ref <130)
TRIGLYCERIDES: 76 mg/dL (ref <150)
VLDL: 15 mg/dL (ref <30)

## 2015-07-03 LAB — COMPREHENSIVE METABOLIC PANEL
ALT: 33 U/L — ABNORMAL HIGH (ref 6–29)
AST: 52 U/L — ABNORMAL HIGH (ref 10–35)
Albumin: 3.7 g/dL (ref 3.6–5.1)
Alkaline Phosphatase: 105 U/L (ref 33–130)
BUN: 9 mg/dL (ref 7–25)
CALCIUM: 9 mg/dL (ref 8.6–10.4)
CO2: 24 mmol/L (ref 20–31)
Chloride: 106 mmol/L (ref 98–110)
Creat: 0.8 mg/dL (ref 0.50–1.05)
GLUCOSE: 86 mg/dL (ref 65–99)
POTASSIUM: 4.2 mmol/L (ref 3.5–5.3)
Sodium: 139 mmol/L (ref 135–146)
Total Bilirubin: 0.3 mg/dL (ref 0.2–1.2)
Total Protein: 7.6 g/dL (ref 6.1–8.1)

## 2015-07-03 LAB — CBC
HEMATOCRIT: 35.8 % (ref 35.0–45.0)
Hemoglobin: 11.7 g/dL (ref 11.7–15.5)
MCH: 32.2 pg (ref 27.0–33.0)
MCHC: 32.7 g/dL (ref 32.0–36.0)
MCV: 98.6 fL (ref 80.0–100.0)
MPV: 9 fL (ref 7.5–12.5)
PLATELETS: 205 10*3/uL (ref 140–400)
RBC: 3.63 MIL/uL — ABNORMAL LOW (ref 3.80–5.10)
RDW: 13.9 % (ref 11.0–15.0)
WBC: 2.5 10*3/uL — AB (ref 3.8–10.8)

## 2015-07-03 MED ORDER — IBUPROFEN 800 MG PO TABS: 800.0000 mg | ORAL_TABLET | Freq: Two times a day (BID) | ORAL | Status: DC | PRN

## 2015-07-03 NOTE — Assessment & Plan Note (Signed)
Doing well Cont her current rx

## 2015-07-03 NOTE — Addendum Note (Signed)
Addended by: Dolan Amen D on: 07/03/2015 03:15 PM   Modules accepted: Orders

## 2015-07-03 NOTE — Progress Notes (Signed)
   Subjective:    Patient ID: Vicki Brown, female    DOB: 04-19-1960, 55 y.o.   MRN: [id number]  HPI  55 yo F with HIV+ since 3-09. Has been on atripla. Had normal PAP 05-26-13, 11-2014, mammogram nl 11-2014. Has been taking lisinopril-hctz for her bp. Has been feeling well.  No problems with atripla. No problems with anti-htn.   HIV 1 RNA QUANT (copies/mL)  Date Value  02/27/2015 <20  09/26/2014 <20  05/30/2014 <20   CD4 T CELL ABS (/uL)  Date Value  02/27/2015 1150  09/26/2014 920  05/30/2014 720    Had nl pap (ex Trich) ( 11-2014) and normal Mammo (11-2014)  Review of Systems  Constitutional: Negative for appetite change and unexpected weight change.  Respiratory: Negative for cough and shortness of breath.   Cardiovascular: Negative for chest pain and leg swelling.  Gastrointestinal: Negative for diarrhea and constipation.  Genitourinary: Negative for difficulty urinating.  Neurological: Negative for headaches.   States he allergies have been "wearing her out"    Objective:   Physical Exam  Constitutional: She appears well-developed and well-nourished.  HENT:  Mouth/Throat: No oropharyngeal exudate.  Eyes: Pupils are equal, round, and reactive to light.  Neck: Neck supple.  Cardiovascular: Normal rate, regular rhythm and normal heart sounds.   Pulmonary/Chest: Effort normal and breath sounds normal.  Abdominal: Soft. Bowel sounds are normal. There is no rebound.  Musculoskeletal: She exhibits no edema.  Lymphadenopathy:    She has no cervical adenopathy.       Assessment & Plan:

## 2015-07-03 NOTE — Assessment & Plan Note (Signed)
Would like refill of motrin 800mg . Will send

## 2015-07-03 NOTE — Assessment & Plan Note (Signed)
Doing well Will recheck her labs today States we prev gave her updated ART but it didn't work Offered/refused condoms.  Will see her back in 6 months, set pap, mammo then

## 2015-07-03 NOTE — Assessment & Plan Note (Signed)
Trial of otc (zyrtec, or flonase, or claritin. Or allegra)

## 2015-07-04 LAB — HIV-1 RNA QUANT-NO REFLEX-BLD
HIV 1 RNA Quant: 37 copies/mL — ABNORMAL HIGH (ref <20)
HIV-1 RNA Quant, Log: 1.57 Log copies/mL — ABNORMAL HIGH (ref <1.30)

## 2015-07-04 LAB — T-HELPER CELL (CD4) - (RCID CLINIC ONLY)
CD4 % Helper T Cell: 59 % — ABNORMAL HIGH (ref 33–55)
CD4 T Cell Abs: 800 /uL (ref 400–2700)

## 2015-07-04 LAB — RPR

## 2015-07-16 ENCOUNTER — Telehealth: Payer: Self-pay | Admitting: Infectious Diseases

## 2015-07-16 ENCOUNTER — Other Ambulatory Visit: Payer: Self-pay | Admitting: *Deleted

## 2015-07-16 DIAGNOSIS — B20 Human immunodeficiency virus [HIV] disease: Secondary | ICD-10-CM

## 2015-07-16 MED ORDER — EFAVIRENZ-EMTRICITAB-TENOFOVIR 600-200-300 MG PO TABS: 1.0000 | ORAL_TABLET | Freq: Every day | ORAL | Status: DC

## 2015-07-16 NOTE — Telephone Encounter (Signed)
Refill sent to [facility] per chart review and patient's pharmacy preference. Landis Gandy, RN

## 2015-07-16 NOTE — Telephone Encounter (Signed)
Patient left a vm on the medication assistance line checking status of refill for atripla.

## 2015-08-01 ENCOUNTER — Emergency Department (HOSPITAL_COMMUNITY): Payer: BLUE CROSS/BLUE SHIELD

## 2015-08-01 ENCOUNTER — Encounter (HOSPITAL_COMMUNITY): Payer: Self-pay | Admitting: Emergency Medicine

## 2015-08-01 ENCOUNTER — Emergency Department (HOSPITAL_COMMUNITY)
Admission: EM | Admit: 2015-08-01 | Discharge: 2015-08-01 | Disposition: A | Discharge status: Home / Self Care | Payer: BLUE CROSS/BLUE SHIELD | Attending: Emergency Medicine | Admitting: Emergency Medicine

## 2015-08-01 DIAGNOSIS — S29011A Strain of muscle and tendon of front wall of thorax, initial encounter: Secondary | ICD-10-CM

## 2015-08-01 DIAGNOSIS — I10 Essential (primary) hypertension: Secondary | ICD-10-CM | POA: Insufficient documentation

## 2015-08-01 DIAGNOSIS — B20 Human immunodeficiency virus [HIV] disease: Secondary | ICD-10-CM | POA: Insufficient documentation

## 2015-08-01 DIAGNOSIS — Z79899 Other long term (current) drug therapy: Secondary | ICD-10-CM | POA: Insufficient documentation

## 2015-08-01 DIAGNOSIS — Y9289 Other specified places as the place of occurrence of the external cause: Secondary | ICD-10-CM | POA: Insufficient documentation

## 2015-08-01 DIAGNOSIS — X500XXA Overexertion from strenuous movement or load, initial encounter: Secondary | ICD-10-CM | POA: Diagnosis not present

## 2015-08-01 DIAGNOSIS — R079 Chest pain, unspecified: Secondary | ICD-10-CM | POA: Diagnosis not present

## 2015-08-01 DIAGNOSIS — Z8619 Personal history of other infectious and parasitic diseases: Secondary | ICD-10-CM | POA: Diagnosis not present

## 2015-08-01 DIAGNOSIS — Y9389 Activity, other specified: Secondary | ICD-10-CM | POA: Diagnosis not present

## 2015-08-01 DIAGNOSIS — Y998 Other external cause status: Secondary | ICD-10-CM | POA: Diagnosis not present

## 2015-08-01 DIAGNOSIS — S29001A Unspecified injury of muscle and tendon of front wall of thorax, initial encounter: Secondary | ICD-10-CM | POA: Diagnosis not present

## 2015-08-01 DIAGNOSIS — Z87891 Personal history of nicotine dependence: Secondary | ICD-10-CM | POA: Diagnosis not present

## 2015-08-01 DIAGNOSIS — Z8669 Personal history of other diseases of the nervous system and sense organs: Secondary | ICD-10-CM | POA: Insufficient documentation

## 2015-08-01 LAB — BASIC METABOLIC PANEL
ANION GAP: 6 (ref 5–15)
BUN: 8 mg/dL (ref 6–20)
CHLORIDE: 107 mmol/L (ref 101–111)
CO2: 26 mmol/L (ref 22–32)
Calcium: 9.6 mg/dL (ref 8.9–10.3)
Creatinine, Ser: 0.81 mg/dL (ref 0.44–1.00)
GFR calc Af Amer: >60 mL/min (ref >60)
GLUCOSE: 99 mg/dL (ref 65–99)
POTASSIUM: 4.2 mmol/L (ref 3.5–5.1)
Sodium: 139 mmol/L (ref 135–145)

## 2015-08-01 LAB — CBC
HEMATOCRIT: 38.8 % (ref 36.0–46.0)
HEMOGLOBIN: 12.7 g/dL (ref 12.0–15.0)
MCH: 32.6 pg (ref 26.0–34.0)
MCHC: 32.7 g/dL (ref 30.0–36.0)
MCV: 99.5 fL (ref 78.0–100.0)
Platelets: 228 10*3/uL (ref 150–400)
RBC: 3.9 MIL/uL (ref 3.87–5.11)
RDW: 13 % (ref 11.5–15.5)
WBC: 3.6 10*3/uL — ABNORMAL LOW (ref 4.0–10.5)

## 2015-08-01 LAB — I-STAT TROPONIN, ED: Troponin i, poc: 0 ng/mL (ref 0.00–0.08)

## 2015-08-01 MED ORDER — ACETAMINOPHEN 325 MG PO TABS
650.0000 mg | ORAL_TABLET | Freq: Once | ORAL | Status: AC
  Administered 2015-08-01: 650 mg via ORAL
  Filled 2015-08-01: qty 2

## 2015-08-01 MED ORDER — CYCLOBENZAPRINE HCL 10 MG PO TABS: 10.0000 mg | ORAL_TABLET | Freq: Two times a day (BID) | ORAL | Status: DC | PRN

## 2015-08-01 NOTE — Discharge Instructions (Signed)

## 2015-08-01 NOTE — ED Provider Notes (Addendum)
CSN: [id number]     Arrival date & time 08/01/15  1103 History   First MD Initiated Contact with Patient 08/01/15 1135     Chief Complaint  Patient presents with  . rib pain      (Consider location/radiation/quality/duration/timing/severity/associated sxs/prior Treatment) Patient is a 55 y.o. female presenting with chest pain. The history is provided by the patient.  Chest Pain Pain location:  R chest Pain quality: aching, sharp and shooting   Pain radiates to:  Upper back Pain radiates to the back: yes   Pain severity:  Moderate Onset quality:  Sudden Duration:  3 days Timing:  Constant Progression:  Unchanged Chronicity:  New Context comment:  Patient does state she does significant heavy lifting at work and thinks that may be what caused her pain Relieved by:  None tried Worsened by:  Deep breathing and coughing (Moving the arm) Ineffective treatments:  None tried Associated symptoms: no abdominal pain, no cough, no dizziness, no fever, no headache, no nausea, no palpitations, no shortness of breath and no weakness   Associated symptoms comment:  She says she clears her throat occasionally but denies any URI symptoms such as nasal congestion, cough, hematemesis, hemoptysis. Pain is not worse with eating. Risk factors: hypertension   Risk factors: no diabetes mellitus, no immobilization, not obese, no prior DVT/PE, no smoking and no surgery   Risk factors comment:  No estrogen supplement   Past Medical History  Diagnosis Date  . HIV infection ([place])   . Hypertension   . Trichomonas infection 12/19/2014  . Yeast infection 12/19/2014  . Insomnia 12/19/2014   Past Surgical History  Procedure Laterality Date  . Tubal ligation     Family History  Problem Relation Age of Onset  . Cancer Mother     uterine vs ovvarian?  . Seizures Father   . Mental illness Brother   . Cancer Maternal Aunt     breast CA  . Diabetes Daughter    Social History  Substance Use Topics  .  Smoking status: Former Smoker    Types: Cigarettes  . Smokeless tobacco: Never Used  . Alcohol Use: 0.0 oz/week    0 Standard drinks or equivalent per week     Comment: rarely   OB History    Gravida Para Term Preterm AB TAB SAB Ectopic Multiple Living   2         2     Review of Systems  Constitutional: Negative for fever.  Respiratory: Negative for cough and shortness of breath.   Cardiovascular: Positive for chest pain. Negative for palpitations.  Gastrointestinal: Negative for nausea and abdominal pain.  Neurological: Negative for dizziness, weakness and headaches.  All other systems reviewed and are negative.     Allergies  Review of patient's allergies indicates no known allergies.  Home Medications   Prior to Admission medications   Medication Sig Start Date End Date Taking? Authorizing Provider  efavirenz-emtricitabine-tenofovir (ATRIPLA) [id number] MG tablet Take 1 tablet by mouth at bedtime. 07/16/15   Campbell Riches, MD  ibuprofen (ADVIL,MOTRIN) 800 MG tablet Take 1 tablet (800 mg total) by mouth 2 (two) times daily as needed (take with food). 07/03/15   Campbell Riches, MD  lisinopril-hydrochlorothiazide (ZESTORETIC) 10-12.5 MG tablet Take 1 tablet by mouth daily. 02/27/15   Campbell Riches, MD   BP 124/92 mmHg  Pulse 79  Temp(Src) 97.9 F (36.6 C) (Oral)  Resp 24  SpO2 99% Physical Exam  Constitutional: She is  oriented to person, place, and time. She appears well-developed and well-nourished. No distress.  HENT:  Head: Normocephalic and atraumatic.  Mouth/Throat: Oropharynx is clear and moist.  Eyes: Conjunctivae and EOM are normal. Pupils are equal, round, and reactive to light.  Neck: Normal range of motion. Neck supple.  Cardiovascular: Normal rate, regular rhythm and intact distal pulses.   No murmur heard. Pulmonary/Chest: Effort normal and breath sounds normal. No respiratory distress. She has no wheezes. She has no rales. She exhibits no  tenderness.  Chest pain is not reproducible by palpation but with moving the right upper extremity pain is reproduced  Abdominal: Soft. She exhibits no distension. There is no tenderness. There is no rebound and no guarding.  Musculoskeletal: Normal range of motion. She exhibits no edema or tenderness.  Neurological: She is alert and oriented to person, place, and time.  Skin: Skin is warm and dry. No rash noted. No erythema.  No rashes or vesicles present over the breast or back  Psychiatric: She has a normal mood and affect. Her behavior is normal.  Nursing note and vitals reviewed.   ED Course  Procedures (including critical care time) Labs Review Labs Reviewed  CBC - Abnormal; Notable for the following:    WBC 3.6 (*)    All other components within normal limits  BASIC METABOLIC PANEL  I-STAT TROPOININ, ED    Imaging Review Dg Chest 2 View  08/01/2015  CLINICAL DATA:  RIGHT shoulder and neck pain beginning 1 week ago, pain into RIGHT chest beginning 1 day ago, hypertension, former smoker, HIV EXAM: CHEST  2 VIEW COMPARISON:  07/09/2013 FINDINGS: Upper normal heart size. Mediastinal contours and pulmonary vascularity normal. Lungs clear. No pleural effusion or pneumothorax. Bones appear slightly demineralized without focal abnormality. IMPRESSION: No acute abnormalities. Electronically Signed   By: Lavonia Dana M.D.   On: 08/01/2015 11:51   I have personally reviewed and evaluated these images and lab results as part of my medical decision-making.   EKG Interpretation   Date/Time:  Thursday Aug 01 2015 11:08:22 EDT Ventricular Rate:  81 PR Interval:  134 QRS Duration: 80 QT Interval:  374 QTC Calculation: 434 R Axis:   36 Text Interpretation:  Normal sinus rhythm Normal ECG Baseline wander No  previous tracing Confirmed by Maryan Rued  MD, Loree Fee ([id number]) on 08/01/2015  11:43:13 AM      MDM   Final diagnoses:  Chest wall muscle strain, initial encounter  Patient is a  55 year old female with a history of hypertension and HIV presenting today with 3 days of right-sided chest discomfort radiating into her back which is worse with deep breaths and movement of her arm. She denies any URI symptoms, productive cough or hematemesis. She has had no shortness of breath or fever. She takes her HIV medication daily and states the last she saw her ID doctor for numbers were good.  Patient has normal vital signs on exam as well appearing. O2 sat is 99% on room air. Pulse is less than 100. Patient has no risk factors for PE. She has any unchanged EKG with normal labs. Chest x-ray is within normal limits. No rashes or evidence of shingles. Will most likely that patient has musculoskeletal chest pain. She states she does a lot of heavy lifting for her job and the pain is definitely worse when she lifts her arm. Will start Tylenol and muscle relaxer.  Blanchie Dessert, MD 08/01/15 1216  Blanchie Dessert, MD 08/01/15 1218

## 2015-08-01 NOTE — ED Notes (Signed)
Pt sts right sided rib pain worse with movement and cough x 3 days

## 2015-08-06 ENCOUNTER — Encounter: Payer: Self-pay | Admitting: Infectious Diseases

## 2015-08-23 ENCOUNTER — Ambulatory Visit: Payer: BLUE CROSS/BLUE SHIELD

## 2015-10-11 ENCOUNTER — Other Ambulatory Visit (HOSPITAL_COMMUNITY)
Admission: RE | Admit: 2015-10-11 | Discharge: 2015-10-11 | Disposition: A | Discharge status: Home / Self Care | Payer: BLUE CROSS/BLUE SHIELD | Source: Ambulatory Visit | Attending: Infectious Diseases | Admitting: Infectious Diseases

## 2015-10-11 ENCOUNTER — Ambulatory Visit (INDEPENDENT_AMBULATORY_CARE_PROVIDER_SITE_OTHER): Payer: BLUE CROSS/BLUE SHIELD | Admitting: *Deleted

## 2015-10-11 DIAGNOSIS — N760 Acute vaginitis: Secondary | ICD-10-CM | POA: Insufficient documentation

## 2015-10-11 DIAGNOSIS — Z01419 Encounter for gynecological examination (general) (routine) without abnormal findings: Secondary | ICD-10-CM | POA: Insufficient documentation

## 2015-10-11 DIAGNOSIS — Z113 Encounter for screening for infections with a predominantly sexual mode of transmission: Secondary | ICD-10-CM | POA: Diagnosis not present

## 2015-10-11 DIAGNOSIS — Z124 Encounter for screening for malignant neoplasm of cervix: Secondary | ICD-10-CM

## 2015-10-11 NOTE — Patient Instructions (Signed)
Your results will be ready in about a week.  I will mail them to you.  Thank you for [facility].  Langley Gauss, RN

## 2015-10-11 NOTE — Progress Notes (Signed)
Subjective:     Vicki Brown is a 55 y.o. woman who comes in today for a  pap smear only. Previous abnormal Pap smears: no. Contraception: condoms  Objective:    There were no vitals taken for this visit. Pelvic Exam:  Pap smear obtained.   Assessment:    Screening pap smear.   Plan:    Follow up in one year, or as indicated by Pap results.   Pt given educational materials re: HIV and women, self-esteem, BSE, nutrition and diet management, PAP smears and partner safety. Pt given condoms

## 2015-10-11 NOTE — Progress Notes (Signed)
Patient is having problems with sleeping all night.  She shared that she gets up multiple times at night, sometimes to urinate.  RN advised to stop drinking liquids at dinner time and empty her bladder right before going to sleep.  Pt also shared that she was previously changed to [facility] but it caused her problems.  Dr. Johnnye Sima switched her back to Atripla.  RN asked the patient whether she would like to talk with ont of our pharmacist about the problems that she had while taking Odefsey.  She agreed to come for an appointment with the pharmacist.  An appointment was made.    The patient has return appointments for Lab work in Sept.   To see Dr. Johnnye Sima in October.

## 2015-10-14 LAB — CERVICOVAGINAL ANCILLARY ONLY
CANDIDA VAGINITIS: NEGATIVE
Chlamydia: NEGATIVE
NEISSERIA GONORRHEA: NEGATIVE
TRICH (WINDOWPATH): NEGATIVE

## 2015-10-14 LAB — CYTOLOGY - PAP

## 2015-10-17 ENCOUNTER — Ambulatory Visit: Payer: BLUE CROSS/BLUE SHIELD

## 2015-10-21 ENCOUNTER — Encounter: Payer: Self-pay | Admitting: *Deleted

## 2015-11-24 ENCOUNTER — Emergency Department (HOSPITAL_COMMUNITY)
Admission: EM | Admit: 2015-11-24 | Discharge: 2015-11-24 | Disposition: A | Discharge status: Home / Self Care | Payer: BLUE CROSS/BLUE SHIELD | Attending: Emergency Medicine | Admitting: Emergency Medicine

## 2015-11-24 ENCOUNTER — Encounter (HOSPITAL_COMMUNITY): Payer: Self-pay

## 2015-11-24 DIAGNOSIS — D709 Neutropenia, unspecified: Secondary | ICD-10-CM | POA: Diagnosis not present

## 2015-11-24 DIAGNOSIS — Z87891 Personal history of nicotine dependence: Secondary | ICD-10-CM | POA: Insufficient documentation

## 2015-11-24 DIAGNOSIS — I10 Essential (primary) hypertension: Secondary | ICD-10-CM | POA: Insufficient documentation

## 2015-11-24 DIAGNOSIS — D72819 Decreased white blood cell count, unspecified: Secondary | ICD-10-CM

## 2015-11-24 DIAGNOSIS — R251 Tremor, unspecified: Secondary | ICD-10-CM

## 2015-11-24 DIAGNOSIS — Z79899 Other long term (current) drug therapy: Secondary | ICD-10-CM | POA: Insufficient documentation

## 2015-11-24 DIAGNOSIS — R451 Restlessness and agitation: Secondary | ICD-10-CM | POA: Diagnosis present

## 2015-11-24 LAB — BASIC METABOLIC PANEL
Anion gap: 6 (ref 5–15)
BUN: 8 mg/dL (ref 6–20)
CO2: 20 mmol/L — ABNORMAL LOW (ref 22–32)
CREATININE: 0.84 mg/dL (ref 0.44–1.00)
Calcium: 8.5 mg/dL — ABNORMAL LOW (ref 8.9–10.3)
Chloride: 111 mmol/L (ref 101–111)
GFR calc Af Amer: >60 mL/min (ref >60)
Glucose, Bld: 84 mg/dL (ref 65–99)
POTASSIUM: 4.2 mmol/L (ref 3.5–5.1)
SODIUM: 137 mmol/L (ref 135–145)

## 2015-11-24 LAB — CBC WITH DIFFERENTIAL/PLATELET
BASOS PCT: 0 %
Basophils Absolute: 0 10*3/uL (ref 0.0–0.1)
EOS PCT: 1 %
Eosinophils Absolute: 0 10*3/uL (ref 0.0–0.7)
HEMATOCRIT: 36.7 % (ref 36.0–46.0)
Hemoglobin: 12.1 g/dL (ref 12.0–15.0)
LYMPHS ABS: 1.7 10*3/uL (ref 0.7–4.0)
Lymphocytes Relative: 63 %
MCH: 33.2 pg (ref 26.0–34.0)
MCHC: 33 g/dL (ref 30.0–36.0)
MCV: 100.5 fL — AB (ref 78.0–100.0)
MONOS PCT: 5 %
Monocytes Absolute: 0.1 10*3/uL (ref 0.1–1.0)
NEUTROS ABS: 0.8 10*3/uL — AB (ref 1.7–7.7)
Neutrophils Relative %: 31 %
Platelets: 218 10*3/uL (ref 150–400)
RBC: 3.65 MIL/uL — AB (ref 3.87–5.11)
RDW: 12.5 % (ref 11.5–15.5)
WBC: 2.6 10*3/uL — AB (ref 4.0–10.5)

## 2015-11-24 LAB — RAPID URINE DRUG SCREEN, HOSP PERFORMED
AMPHETAMINES: NOT DETECTED
BARBITURATES: NOT DETECTED
BENZODIAZEPINES: NOT DETECTED
Cocaine: NOT DETECTED
Opiates: NOT DETECTED
Tetrahydrocannabinol: POSITIVE — AB

## 2015-11-24 LAB — ETHANOL: Alcohol, Ethyl (B): 25 mg/dL — ABNORMAL HIGH (ref <5)

## 2015-11-24 NOTE — ED Triage Notes (Signed)
Patient complains of restlessness and anxiety with fine tremor x 2 days. Denies any depression, no medication changes. Patient states that she falls asleep but cant stay asleep, denies pain, alert and oriented. NAD

## 2015-11-24 NOTE — ED Provider Notes (Signed)
[place] DEPT Provider Note   CSN: [id number] Arrival date & time: 11/24/15  1340     History   Chief Complaint Chief Complaint  Patient presents with  . Anxiety  . restless    HPI Vicki Brown is a 55 y.o. female.  The patient is a 55 year old female, she has a history of alcohol use, HIV infection, hypertension. She states that she usually drinks a couple of beers a day, last night she was at a party where she smoked lots of marijuana, she reports that this morning when she woke up she was feeling jittery, this is lasted throughout the day but is gradually improving and at this time does not feel jittery anymore. She states that she has chronic insomnia. She denies any other symptoms including chest pain, shortness of breath, abdominal pain, back pain, neck pain, headache, blurred vision, nausea, vomiting, diarrhea, dysuria, swelling, rashes, recent insects bites or recent travel.   The history is provided by the patient.  Anxiety     Past Medical History:  Diagnosis Date  . HIV infection ([place])   . Hypertension   . Insomnia 12/19/2014  . Trichomonas infection 12/19/2014  . Yeast infection 12/19/2014    Patient Active Problem List   Diagnosis Date Noted  . Right wrist pain 07/03/2015  . Trichomonas infection 12/19/2014  . Yeast infection 12/19/2014  . Insomnia 12/19/2014  . Chest pain 11/07/2014  . Allergic rhinitis 12/28/2012  . Transaminitis 11/03/2010  . Dyslipidemia 07/31/2010  . Human immunodeficiency virus (HIV) disease ([place]) 01/19/2008  . PERIPHERAL NEUROPATHY 01/19/2008  . Essential hypertension 01/19/2008    Past Surgical History:  Procedure Laterality Date  . TUBAL LIGATION      OB History    Gravida Para Term Preterm AB Living   2         2   SAB TAB Ectopic Multiple Live Births                   Home Medications    Prior to Admission medications   Medication Sig Start Date End Date Taking? Authorizing Provider  cyclobenzaprine  (FLEXERIL) 10 MG tablet Take 1 tablet (10 mg total) by mouth 2 (two) times daily as needed for muscle spasms. 08/01/15   Blanchie Dessert, MD  efavirenz-emtricitabine-tenofovir (ATRIPLA) 600-200-300 MG tablet Take 1 tablet by mouth at bedtime. 07/16/15   Campbell Riches, MD  ibuprofen (ADVIL,MOTRIN) 800 MG tablet Take 1 tablet (800 mg total) by mouth 2 (two) times daily as needed (take with food). 07/03/15   Campbell Riches, MD  lisinopril-hydrochlorothiazide (ZESTORETIC) 10-12.5 MG tablet Take 1 tablet by mouth daily. 02/27/15   Campbell Riches, MD    Family History Family History  Problem Relation Age of Onset  . Cancer Mother     uterine vs ovvarian?  . Seizures Father   . Mental illness Brother   . Cancer Maternal Aunt     breast CA  . Diabetes Daughter     Social History Social History  Substance Use Topics  . Smoking status: Former Smoker    Types: Cigarettes  . Smokeless tobacco: Never Used  . Alcohol use 0.0 oz/week     Comment: rarely     Allergies   Review of patient's allergies indicates no known allergies.   Review of Systems Review of Systems  All other systems reviewed and are negative.    Physical Exam Updated Vital Signs BP 144/97   Pulse 81  Temp 98.2 F (36.8 C) (Oral)   Resp 18   Wt 156 lb (70.8 kg)   SpO2 100%   BMI 25.18 kg/m   Physical Exam  Constitutional: She appears well-developed and well-nourished. No distress.  HENT:  Head: Normocephalic and atraumatic.  Mouth/Throat: Oropharynx is clear and moist. No oropharyngeal exudate.  Eyes: Conjunctivae and EOM are normal. Pupils are equal, round, and reactive to light. Right eye exhibits no discharge. Left eye exhibits no discharge. No scleral icterus.  Neck: Normal range of motion. Neck supple. No JVD present. No thyromegaly present.  Cardiovascular: Normal rate, regular rhythm, normal heart sounds and intact distal pulses.  Exam reveals no gallop and no friction rub.   No murmur  heard. Pulmonary/Chest: Effort normal and breath sounds normal. No respiratory distress. She has no wheezes. She has no rales.  Abdominal: Soft. Bowel sounds are normal. She exhibits no distension and no mass. There is no tenderness.  Musculoskeletal: Normal range of motion. She exhibits no edema or tenderness.  Lymphadenopathy:    She has no cervical adenopathy.  Neurological: She is alert. Coordination normal.  Neurologic exam:  Speech clear, pupils equal round reactive to light, extraocular movements intact  Normal peripheral visual fields Cranial nerves III through XII normal including no facial droop Follows commands, moves all extremities x4, normal strength to bilateral upper and lower extremities at all major muscle groups including grip Sensation normal to light touch and pinprick Coordination intact, no limb ataxia, finger-nose-finger normal Rapid alternating movements normal No pronator drift Gait normal  Skin: Skin is warm and dry. No rash noted. No erythema.  Psychiatric: She has a normal mood and affect. Her behavior is normal.  Nursing note and vitals reviewed.    ED Treatments / Results  Labs (all labs ordered are listed, but only abnormal results are displayed) Labs Reviewed  ETHANOL - Abnormal; Notable for the following:       Result Value   Alcohol, Ethyl (B) 25 (*)    All other components within normal limits  CBC WITH DIFFERENTIAL/PLATELET - Abnormal; Notable for the following:    WBC 2.6 (*)    RBC 3.65 (*)    MCV 100.5 (*)    Neutro Abs 0.8 (*)    All other components within normal limits  URINE RAPID DRUG SCREEN, HOSP PERFORMED - Abnormal; Notable for the following:    Tetrahydrocannabinol POSITIVE (*)    All other components within normal limits  BASIC METABOLIC PANEL - Abnormal; Notable for the following:    CO2 20 (*)    Calcium 8.5 (*)    All other components within normal limits    EKG  EKG Interpretation None       Radiology No  results found.  Procedures Procedures (including critical care time)  Medications Ordered in ED Medications - No data to display   Initial Impression / Assessment and Plan / ED Course  I have reviewed the triage vital signs and the nursing notes.  Pertinent labs & imaging results that were available during my care of the patient were reviewed by me and considered in my medical decision making (see chart for details).  Clinical Course  The labs are unremarkable, the patient does have a chronic leukopenia likely related to HIV, no other signs of hypokalemia, she has been drinking already today, she has no tremor and does not appear to be in withdrawal. She is stable for discharge, I have cautioned her to stop using marijuana Final diagnoses:  Tremor  Leukopenia    New Prescriptions New Prescriptions   No medications on file     Noemi Chapel, MD 11/24/15 2007

## 2015-11-25 ENCOUNTER — Telehealth: Payer: Self-pay | Admitting: Infectious Diseases

## 2015-11-25 ENCOUNTER — Other Ambulatory Visit: Payer: Self-pay | Admitting: *Deleted

## 2015-11-25 DIAGNOSIS — B20 Human immunodeficiency virus [HIV] disease: Secondary | ICD-10-CM

## 2015-11-25 MED ORDER — EFAVIRENZ-EMTRICITAB-TENOFOVIR 600-200-300 MG PO TABS: 1.0000 | ORAL_TABLET | Freq: Every day | ORAL | 2 refills | Status: DC

## 2015-11-25 NOTE — Telephone Encounter (Signed)
Patient is needing her prescription for Atripla.  She says that [facility] told her they don't have it.

## 2015-11-25 NOTE — Telephone Encounter (Signed)
Refill sent.

## 2015-11-26 LAB — PATHOLOGIST SMEAR REVIEW

## 2015-11-28 ENCOUNTER — Other Ambulatory Visit: Payer: BLUE CROSS/BLUE SHIELD

## 2015-12-11 ENCOUNTER — Ambulatory Visit: Payer: BLUE CROSS/BLUE SHIELD | Admitting: Infectious Diseases

## 2015-12-18 ENCOUNTER — Ambulatory Visit (INDEPENDENT_AMBULATORY_CARE_PROVIDER_SITE_OTHER): Payer: BLUE CROSS/BLUE SHIELD

## 2015-12-18 ENCOUNTER — Other Ambulatory Visit: Payer: BLUE CROSS/BLUE SHIELD

## 2015-12-18 DIAGNOSIS — B20 Human immunodeficiency virus [HIV] disease: Secondary | ICD-10-CM

## 2015-12-18 DIAGNOSIS — Z23 Encounter for immunization: Secondary | ICD-10-CM | POA: Diagnosis not present

## 2015-12-18 LAB — CBC
HEMATOCRIT: 36.4 % (ref 35.0–45.0)
HEMOGLOBIN: 12 g/dL (ref 11.7–15.5)
MCH: 32.9 pg (ref 27.0–33.0)
MCHC: 33 g/dL (ref 32.0–36.0)
MCV: 99.7 fL (ref 80.0–100.0)
MPV: 8.6 fL (ref 7.5–12.5)
Platelets: 247 10*3/uL (ref 140–400)
RBC: 3.65 MIL/uL — AB (ref 3.80–5.10)
RDW: 13.2 % (ref 11.0–15.0)
WBC: 3.7 10*3/uL — ABNORMAL LOW (ref 3.8–10.8)

## 2015-12-18 LAB — COMPREHENSIVE METABOLIC PANEL
ALBUMIN: 4 g/dL (ref 3.6–5.1)
ALK PHOS: 114 U/L (ref 33–130)
ALT: 41 U/L — AB (ref 6–29)
AST: 55 U/L — AB (ref 10–35)
BILIRUBIN TOTAL: 0.3 mg/dL (ref 0.2–1.2)
BUN: 11 mg/dL (ref 7–25)
CALCIUM: 9 mg/dL (ref 8.6–10.4)
CO2: 23 mmol/L (ref 20–31)
Chloride: 107 mmol/L (ref 98–110)
Creat: 0.96 mg/dL (ref 0.50–1.05)
Glucose, Bld: 83 mg/dL (ref 65–99)
POTASSIUM: 3.9 mmol/L (ref 3.5–5.3)
Sodium: 140 mmol/L (ref 135–146)
Total Protein: 7.4 g/dL (ref 6.1–8.1)

## 2015-12-19 LAB — T-HELPER CELL (CD4) - (RCID CLINIC ONLY)
CD4 % Helper T Cell: 61 % — ABNORMAL HIGH (ref 33–55)
CD4 T CELL ABS: 1020 /uL (ref 400–2700)

## 2015-12-20 LAB — HIV-1 RNA QUANT-NO REFLEX-BLD
HIV 1 RNA Quant: <20 copies/mL (ref <20)
HIV-1 RNA Quant, Log: <1.3 Log copies/mL (ref <1.30)

## 2016-01-01 ENCOUNTER — Ambulatory Visit: Payer: BLUE CROSS/BLUE SHIELD

## 2016-01-14 ENCOUNTER — Encounter: Payer: Self-pay | Admitting: Infectious Diseases

## 2016-01-21 ENCOUNTER — Telehealth: Payer: Self-pay | Admitting: *Deleted

## 2016-01-21 NOTE — Telephone Encounter (Signed)
Patient called stating she injured her ankle and wanted to be seen by Dr. Johnnye Sima. Unfortunately his next available appt is January. Advised patient to go to Urgent Care to be evaluated. She agreed, and stated she did need to schedule a follow up with the MD. Scheduled her an appt with Dr. Algis Downs first available in January. Myrtis Hopping CMA

## 2016-01-24 ENCOUNTER — Ambulatory Visit (HOSPITAL_COMMUNITY)
Admission: EM | Admit: 2016-01-24 | Discharge: 2016-01-24 | Disposition: A | Discharge status: Home / Self Care | Payer: BLUE CROSS/BLUE SHIELD | Attending: Internal Medicine | Admitting: Internal Medicine

## 2016-01-24 ENCOUNTER — Ambulatory Visit (INDEPENDENT_AMBULATORY_CARE_PROVIDER_SITE_OTHER): Payer: BLUE CROSS/BLUE SHIELD

## 2016-01-24 ENCOUNTER — Encounter (HOSPITAL_COMMUNITY): Payer: Self-pay | Admitting: Family Medicine

## 2016-01-24 DIAGNOSIS — M1990 Unspecified osteoarthritis, unspecified site: Secondary | ICD-10-CM

## 2016-01-24 DIAGNOSIS — S92331A Displaced fracture of third metatarsal bone, right foot, initial encounter for closed fracture: Secondary | ICD-10-CM | POA: Diagnosis not present

## 2016-01-24 DIAGNOSIS — S92301A Fracture of unspecified metatarsal bone(s), right foot, initial encounter for closed fracture: Secondary | ICD-10-CM | POA: Diagnosis not present

## 2016-01-24 DIAGNOSIS — S92321A Displaced fracture of second metatarsal bone, right foot, initial encounter for closed fracture: Secondary | ICD-10-CM | POA: Diagnosis not present

## 2016-01-24 MED ORDER — NAPROXEN 500 MG PO TABS: 500.0000 mg | ORAL_TABLET | Freq: Two times a day (BID) | ORAL | 0 refills | Status: DC

## 2016-01-24 NOTE — Discharge Instructions (Addendum)
Xrays today showed fractures (breaks) in the 2nd and 3rd foot bones (metatarsals) near the toes. Exam suggests the possibility of a little bit of gout (with redness/warmth/swelling of foot) also.  Wear boot.  Prescription for naproxen sent to the [facility] on [address] at [employer].  Followup with podiatrist in about a week.  Ice and elevate foot for 5-10 minutes as needed to help with pain/swelling.

## 2016-01-24 NOTE — ED Provider Notes (Signed)
[place]    CSN: [id number] Arrival date & time: 01/24/16  1059     History   Chief Complaint Chief Complaint  Patient presents with  . Foot Pain    HPI Vicki Brown is a 55 y.o. female. She presents with 10 day history of pain and swelling in her right foot. She has a prior history of distal right second metatarsal fracture. She recalls whacking her foot into something, maybe the couch, at the onset of symptoms. Pain is not resolving. No other injuries reported    HPI  Past Medical History:  Diagnosis Date  . HIV infection ([place])   . Hypertension   . Insomnia 12/19/2014  . Trichomonas infection 12/19/2014  . Yeast infection 12/19/2014    Patient Active Problem List   Diagnosis Date Noted  . Right wrist pain 07/03/2015  . Trichomonas infection 12/19/2014  . Yeast infection 12/19/2014  . Insomnia 12/19/2014  . Chest pain 11/07/2014  . Allergic rhinitis 12/28/2012  . Transaminitis 11/03/2010  . Dyslipidemia 07/31/2010  . Human immunodeficiency virus (HIV) disease ([place]) 01/19/2008  . PERIPHERAL NEUROPATHY 01/19/2008  . Essential hypertension 01/19/2008    Past Surgical History:  Procedure Laterality Date  . TUBAL LIGATION      OB History    Gravida Para Term Preterm AB Living   2         2   SAB TAB Ectopic Multiple Live Births                   Home Medications    Prior to Admission medications   Medication Sig Start Date End Date Taking? Authorizing Provider  cyclobenzaprine (FLEXERIL) 10 MG tablet Take 1 tablet (10 mg total) by mouth 2 (two) times daily as needed for muscle spasms. 08/01/15   Blanchie Dessert, MD  efavirenz-emtricitabine-tenofovir (ATRIPLA) 600-200-300 MG tablet Take 1 tablet by mouth at bedtime. 11/25/15   Campbell Riches, MD  ibuprofen (ADVIL,MOTRIN) 800 MG tablet Take 1 tablet (800 mg total) by mouth 2 (two) times daily as needed (take with food). 07/03/15   Campbell Riches, MD  lisinopril-hydrochlorothiazide  (ZESTORETIC) 10-12.5 MG tablet Take 1 tablet by mouth daily. 02/27/15   Campbell Riches, MD  naproxen (NAPROSYN) 500 MG tablet Take 1 tablet (500 mg total) by mouth 2 (two) times daily. 01/24/16   Sherlene Shams, MD    Family History Family History  Problem Relation Age of Onset  . Cancer Mother     uterine vs ovvarian?  . Seizures Father   . Mental illness Brother   . Cancer Maternal Aunt     breast CA  . Diabetes Daughter     Social History Social History  Substance Use Topics  . Smoking status: Former Smoker    Types: Cigarettes  . Smokeless tobacco: Never Used  . Alcohol use 0.0 oz/week     Comment: rarely     Allergies   Patient has no known allergies.   Review of Systems Review of Systems  All other systems reviewed and are negative.    Physical Exam Triage Vital Signs ED Triage Vitals  Enc Vitals Group     BP 01/24/16 1112 131/86     Pulse Rate 01/24/16 1112 77     Resp 01/24/16 1112 18     Temp 01/24/16 1112 98.4 F (36.9 C)     Temp Source 01/24/16 1112 Oral     SpO2 01/24/16 1112 100 %  Weight --      Height --      Pain Score 01/24/16 1113 5   Updated Vital Signs BP 131/86   Pulse 77   Temp 98.4 F (36.9 C) (Oral)   Resp 18   SpO2 100%  Physical Exam  Constitutional: She is oriented to person, place, and time. No distress.  Alert, nicely groomed  HENT:  Head: Atraumatic.  Eyes:  Conjugate gaze, no eye redness/drainage  Neck: Neck supple.  Cardiovascular: Normal rate.   Pulmonary/Chest: No respiratory distress.  Abdominal: She exhibits no distension.  Musculoskeletal: Normal range of motion.  No leg swelling Right foot dorsally is red/swollen/tender, warm to palpation. No bruising evident. Able to wiggle toes. Most pain is located around the distal second and third metatarsals.  Neurological: She is alert and oriented to person, place, and time.  Skin: Skin is warm and dry.  No cyanosis  Nursing note and vitals  reviewed.    UC Treatments / Results   Radiology Dg Foot Complete Right  Result Date: 01/24/2016 CLINICAL DATA:  Injured foot 10 days ago. Persistent pain involving the second and third toes. EXAM: RIGHT FOOT COMPLETE - 3+ VIEW COMPARISON:  08/20/2014. FINDINGS: Remote healed fracture of the proximal phalanx of the second toe is noted. Nondisplaced but slightly impacted fractures involving the second and third metatarsal necks. The other bony structures are intact. Moderate soft tissue swelling. IMPRESSION: Slightly impacted second and third metatarsal neck fractures. Electronically Signed   By: Marijo Sanes M.D.   On: 01/24/2016 11:25    Procedures Procedures (including critical care time) Boot orthosis applied by clinical staff   Final Clinical Impressions(s) / UC Diagnoses   Final diagnoses:  Closed traumatic nondisplaced fracture of metatarsal bone of right foot, initial encounter  Inflammatory arthropathy   Xrays today showed fractures (breaks) in the 2nd and 3rd foot bones (metatarsals) near the toes. Exam suggests the possibility of a little bit of gout (with redness/warmth/swelling of foot) also.  Wear boot.  Prescription for naproxen sent to the [facility] on [address] at [employer].  Followup with podiatrist in about a week.  Ice and elevate foot for 5-10 minutes as needed to help with pain/swelling.  New Prescriptions Discharge Medication List as of 01/24/2016 11:51 AM    START taking these medications   Details  naproxen (NAPROSYN) 500 MG tablet Take 1 tablet (500 mg total) by mouth 2 (two) times daily., Starting Fri 01/24/2016, Normal         Sherlene Shams, MD 01/24/16 [phone]

## 2016-01-24 NOTE — ED Triage Notes (Signed)
Pt here for right foot pain after hitting it on the door a week ago. sts pain to dorsal aspect of foot.

## 2016-02-03 ENCOUNTER — Encounter (HOSPITAL_COMMUNITY): Payer: Self-pay | Admitting: Emergency Medicine

## 2016-02-03 ENCOUNTER — Ambulatory Visit (HOSPITAL_COMMUNITY)
Admission: EM | Admit: 2016-02-03 | Discharge: 2016-02-03 | Disposition: A | Discharge status: Home / Self Care | Payer: BLUE CROSS/BLUE SHIELD | Attending: Emergency Medicine | Admitting: Emergency Medicine

## 2016-02-03 DIAGNOSIS — R110 Nausea: Secondary | ICD-10-CM | POA: Diagnosis not present

## 2016-02-03 DIAGNOSIS — K2900 Acute gastritis without bleeding: Secondary | ICD-10-CM | POA: Diagnosis not present

## 2016-02-03 MED ORDER — PROMETHAZINE HCL 25 MG PO TABS: 25.0000 mg | ORAL_TABLET | Freq: Four times a day (QID) | ORAL | 0 refills | Status: DC | PRN

## 2016-02-03 NOTE — ED Triage Notes (Signed)
Pt. Stated I think I have food poison from last week, I had a baked chicken and salad last week. This is when I got to feeling sick. I have a lot of indigestion for over a week.

## 2016-02-03 NOTE — ED Provider Notes (Signed)
CSN: [id number]     Arrival date & time 02/03/16  1646 History   First MD Initiated Contact with Patient 02/03/16 1904     Chief Complaint  Patient presents with  . Abdominal Pain  . Gastroesophageal Reflux  . Nausea   (Consider location/radiation/quality/duration/timing/severity/associated sxs/prior Treatment) Pt was eating on wed and states that she began having diahrrea x3 per day and nausea 30 mins after eating. Has not been able to eat much food since then. Denies any emesis. Minimal abd pain states that it is a cramping feeling. Denies any urinary sx states that she has had UTI in the past and this is not those sx. Denies any dark stools. Denies any chest pain, no sob, states that she has a pcp that she called and made an appoint with them. Pt wanted something to settle her stomach.       Past Medical History:  Diagnosis Date  . HIV infection ([place])   . Hypertension   . Insomnia 12/19/2014  . Trichomonas infection 12/19/2014  . Yeast infection 12/19/2014   Past Surgical History:  Procedure Laterality Date  . TUBAL LIGATION     Family History  Problem Relation Age of Onset  . Cancer Mother     uterine vs ovvarian?  . Seizures Father   . Mental illness Brother   . Cancer Maternal Aunt     breast CA  . Diabetes Daughter    Social History  Substance Use Topics  . Smoking status: Former Smoker    Types: Cigarettes  . Smokeless tobacco: Never Used  . Alcohol use 0.0 oz/week     Comment: rarely   OB History    Gravida Para Term Preterm AB Living   2         2   SAB TAB Ectopic Multiple Live Births                 Review of Systems  Constitutional: Negative.   Respiratory: Negative.   Cardiovascular: Negative.   Gastrointestinal: Positive for diarrhea and nausea.  Genitourinary: Negative.   Skin: Negative.   Neurological: Negative.     Allergies  Patient has no known allergies.  Home Medications   Prior to Admission medications   Medication Sig Start  Date End Date Taking? Authorizing Provider  cyclobenzaprine (FLEXERIL) 10 MG tablet Take 1 tablet (10 mg total) by mouth 2 (two) times daily as needed for muscle spasms. 08/01/15   Blanchie Dessert, MD  efavirenz-emtricitabine-tenofovir (ATRIPLA) 600-200-300 MG tablet Take 1 tablet by mouth at bedtime. 11/25/15   Campbell Riches, MD  ibuprofen (ADVIL,MOTRIN) 800 MG tablet Take 1 tablet (800 mg total) by mouth 2 (two) times daily as needed (take with food). 07/03/15   Campbell Riches, MD  lisinopril-hydrochlorothiazide (ZESTORETIC) 10-12.5 MG tablet Take 1 tablet by mouth daily. 02/27/15   Campbell Riches, MD  naproxen (NAPROSYN) 500 MG tablet Take 1 tablet (500 mg total) by mouth 2 (two) times daily. 01/24/16   Sherlene Shams, MD  promethazine (PHENERGAN) 25 MG tablet Take 1 tablet (25 mg total) by mouth every 6 (six) hours as needed for nausea or vomiting. 02/03/16   Melanee Left, NP   Meds Ordered and Administered this Visit  Medications - No data to display  BP 110/71 (BP Location: Left Arm)   Pulse 72   Temp 98.6 F (37 C) (Oral)   Resp 17   Ht 5\' 6"  (1.676 m)   Wt 157 lb (  71.2 kg)   SpO2 100%   BMI 25.34 kg/m  No data found.   Physical Exam  Constitutional: She appears well-developed.  Cardiovascular: Normal rate and regular rhythm.   Denies any chest pain   Pulmonary/Chest: Effort normal and breath sounds normal.  Abdominal: Soft. Bowel sounds are normal.  C/o nausea post food ingestion, denies any rectal bleeding.   Musculoskeletal: Normal range of motion.  Neurological: She is alert.  Skin: Skin is warm.    Urgent Care Course   Clinical Course     Procedures (including critical care time)  Labs Review Labs Reviewed - No data to display  Imaging Review No results found.          MDM   1. Other acute gastritis without hemorrhage   2. Nausea    Discussed that if pt has chest pain ,rectal bleeding, for sob she needs to go to the er.  Avoid  spicy foods Eat bland foods and stay hydrated.    Melanee Left, NP 02/03/16 1911

## 2016-02-03 NOTE — Discharge Instructions (Signed)
Discussed that if pt has chest pain ,rectal bleeding, for sob she needs to go to the er.  Avoid spicy foods Eat bland foods and stay hydrated.

## 2016-02-19 ENCOUNTER — Encounter: Payer: BLUE CROSS/BLUE SHIELD | Admitting: Podiatry

## 2016-03-06 ENCOUNTER — Ambulatory Visit: Payer: BLUE CROSS/BLUE SHIELD | Admitting: Podiatry

## 2016-03-06 NOTE — Progress Notes (Signed)
This encounter was created in error - please disregard.

## 2016-03-21 ENCOUNTER — Other Ambulatory Visit: Payer: Self-pay | Admitting: Infectious Diseases

## 2016-03-21 DIAGNOSIS — B20 Human immunodeficiency virus [HIV] disease: Secondary | ICD-10-CM

## 2016-03-25 ENCOUNTER — Ambulatory Visit: Payer: BLUE CROSS/BLUE SHIELD | Admitting: Infectious Diseases

## 2016-04-22 ENCOUNTER — Encounter: Payer: Self-pay | Admitting: Infectious Diseases

## 2016-04-22 ENCOUNTER — Ambulatory Visit (INDEPENDENT_AMBULATORY_CARE_PROVIDER_SITE_OTHER): Payer: BLUE CROSS/BLUE SHIELD | Admitting: Infectious Diseases

## 2016-04-22 ENCOUNTER — Other Ambulatory Visit (HOSPITAL_COMMUNITY)
Admission: RE | Admit: 2016-04-22 | Discharge: 2016-04-22 | Disposition: A | Discharge status: Home / Self Care | Payer: BLUE CROSS/BLUE SHIELD | Source: Ambulatory Visit | Attending: Infectious Diseases | Admitting: Infectious Diseases

## 2016-04-22 VITALS — BP 121/83 | HR 99 | Temp 98.4°F | Ht 66.0 in | Wt 160.0 lb

## 2016-04-22 DIAGNOSIS — Z113 Encounter for screening for infections with a predominantly sexual mode of transmission: Secondary | ICD-10-CM | POA: Insufficient documentation

## 2016-04-22 DIAGNOSIS — Z789 Other specified health status: Secondary | ICD-10-CM

## 2016-04-22 DIAGNOSIS — Z79899 Other long term (current) drug therapy: Secondary | ICD-10-CM

## 2016-04-22 DIAGNOSIS — I10 Essential (primary) hypertension: Secondary | ICD-10-CM

## 2016-04-22 DIAGNOSIS — B20 Human immunodeficiency virus [HIV] disease: Secondary | ICD-10-CM | POA: Diagnosis not present

## 2016-04-22 LAB — COMPREHENSIVE METABOLIC PANEL
ALBUMIN: 3.8 g/dL (ref 3.6–5.1)
ALT: 61 U/L — ABNORMAL HIGH (ref 6–29)
AST: 70 U/L — AB (ref 10–35)
Alkaline Phosphatase: 157 U/L — ABNORMAL HIGH (ref 33–130)
BILIRUBIN TOTAL: 0.3 mg/dL (ref 0.2–1.2)
BUN: 10 mg/dL (ref 7–25)
CO2: 25 mmol/L (ref 20–31)
CREATININE: 0.91 mg/dL (ref 0.50–1.05)
Calcium: 9.3 mg/dL (ref 8.6–10.4)
Chloride: 107 mmol/L (ref 98–110)
Glucose, Bld: 87 mg/dL (ref 65–99)
Potassium: 4 mmol/L (ref 3.5–5.3)
SODIUM: 140 mmol/L (ref 135–146)
TOTAL PROTEIN: 7.1 g/dL (ref 6.1–8.1)

## 2016-04-22 LAB — CBC
HCT: 34.8 % — ABNORMAL LOW (ref 35.0–45.0)
Hemoglobin: 11.6 g/dL — ABNORMAL LOW (ref 11.7–15.5)
MCH: 32.8 pg (ref 27.0–33.0)
MCHC: 33.3 g/dL (ref 32.0–36.0)
MCV: 98.3 fL (ref 80.0–100.0)
MPV: 8.8 fL (ref 7.5–12.5)
PLATELETS: 241 10*3/uL (ref 140–400)
RBC: 3.54 MIL/uL — AB (ref 3.80–5.10)
RDW: 13.4 % (ref 11.0–15.0)
WBC: 2.6 10*3/uL — AB (ref 3.8–10.8)

## 2016-04-22 LAB — LIPID PANEL
CHOL/HDL RATIO: 2.8 ratio (ref <5.0)
Cholesterol: 202 mg/dL — ABNORMAL HIGH (ref <200)
HDL: 73 mg/dL (ref >50)
LDL CALC: 116 mg/dL — AB (ref <100)
Triglycerides: 64 mg/dL (ref <150)
VLDL: 13 mg/dL (ref <30)

## 2016-04-22 NOTE — Progress Notes (Signed)
   Subjective:    Patient ID: Vicki Brown, female    DOB: 1960/08/22, 56 y.o.   MRN: [id number]  HPI 56 yo F with HIV+ since 3-09. Has been on atripla.Has been taking lisinopril-hctz for her bp. Has been feeling well. Last week had GI upset. Today is having sinus congestion. Has not had any ETOH fo rmarijuana for 2 weeks.  No probs with BP, does not check at home.    PAP- 10-2015 mammo- 08-2014 Colon- never  HIV 1 RNA Quant (copies/mL)  Date Value  12/18/2015 <20  07/03/2015 37 (H)  02/27/2015 <20   CD4 T Cell Abs (/uL)  Date Value  12/18/2015 1,020  07/03/2015 800  02/27/2015 1,150    Review of Systems  Constitutional: Negative for appetite change, chills, fever and unexpected weight change.  HENT: Positive for congestion.   Respiratory: Negative for cough.   Cardiovascular: Negative for chest pain and leg swelling.  Gastrointestinal: Negative for constipation and diarrhea.  Genitourinary: Negative for difficulty urinating and menstrual problem.  Neurological: Negative for headaches.       Objective:   Physical Exam  Constitutional: She appears well-developed and well-nourished.  HENT:  Mouth/Throat: No oropharyngeal exudate.  Eyes: EOM are normal. Pupils are equal, round, and reactive to light.  Neck: Neck supple.  Cardiovascular: Normal rate, regular rhythm and normal heart sounds.   Pulmonary/Chest: Effort normal and breath sounds normal.  Abdominal: Soft. Bowel sounds are normal. There is no tenderness. There is no rebound.  Musculoskeletal: She exhibits no edema.  Lymphadenopathy:    She has no cervical adenopathy.  Psychiatric: She has a normal mood and affect.      Assessment & Plan:

## 2016-04-22 NOTE — Assessment & Plan Note (Addendum)
She is doing well Will check her labs today Will set her up for pap mammo at f/u Will set her for colon.  Given condoms.  rtc in 6 months.

## 2016-04-22 NOTE — Assessment & Plan Note (Signed)
She is doing well No change in meds Check her labs/potassium today.

## 2016-04-23 LAB — URINE CYTOLOGY ANCILLARY ONLY
Chlamydia: NEGATIVE
Neisseria Gonorrhea: NEGATIVE

## 2016-04-23 LAB — T-HELPER CELL (CD4) - (RCID CLINIC ONLY)
CD4 % Helper T Cell: 63 % — ABNORMAL HIGH (ref 33–55)
CD4 T CELL ABS: 830 /uL (ref 400–2700)

## 2016-04-23 LAB — RPR

## 2016-04-24 ENCOUNTER — Other Ambulatory Visit: Payer: Self-pay | Admitting: *Deleted

## 2016-04-24 DIAGNOSIS — B20 Human immunodeficiency virus [HIV] disease: Secondary | ICD-10-CM

## 2016-04-24 MED ORDER — EFAVIRENZ-EMTRICITAB-TENOFOVIR 600-200-300 MG PO TABS: 1.0000 | ORAL_TABLET | Freq: Every day | ORAL | 3 refills | Status: DC

## 2016-04-29 LAB — HIV-1 RNA QUANT-NO REFLEX-BLD
HIV 1 RNA Quant: <20 copies/mL — AB
HIV-1 RNA QUANT, LOG: DETECTED {Log_copies}/mL — AB

## 2016-05-03 ENCOUNTER — Other Ambulatory Visit: Payer: Self-pay | Admitting: Infectious Diseases

## 2016-05-03 DIAGNOSIS — B20 Human immunodeficiency virus [HIV] disease: Secondary | ICD-10-CM

## 2016-06-26 ENCOUNTER — Encounter: Payer: Self-pay | Admitting: Gastroenterology

## 2016-06-30 ENCOUNTER — Other Ambulatory Visit: Payer: Self-pay

## 2016-08-19 ENCOUNTER — Ambulatory Visit (AMBULATORY_SURGERY_CENTER): Payer: Self-pay

## 2016-08-19 ENCOUNTER — Encounter: Payer: Self-pay | Admitting: Gastroenterology

## 2016-08-19 VITALS — Ht 66.0 in | Wt 163.2 lb

## 2016-08-19 DIAGNOSIS — Z1211 Encounter for screening for malignant neoplasm of colon: Secondary | ICD-10-CM

## 2016-08-19 MED ORDER — NA SULFATE-K SULFATE-MG SULF 17.5-3.13-1.6 GM/177ML PO SOLN: 1.0000 | Freq: Once | ORAL | 0 refills | Status: AC

## 2016-08-19 NOTE — Progress Notes (Signed)
Denies allergies to eggs or soy products. Denies complication of anesthesia or sedation. Denies use of weight loss medication. Denies use of O2.   Emmi instructions declined, no home computer.

## 2016-08-28 ENCOUNTER — Other Ambulatory Visit: Payer: Self-pay

## 2016-08-28 ENCOUNTER — Telehealth: Payer: Self-pay | Admitting: Gastroenterology

## 2016-08-28 MED ORDER — NA SULFATE-K SULFATE-MG SULF 17.5-3.13-1.6 GM/177ML PO SOLN: 1.0000 | Freq: Once | ORAL | 0 refills | Status: AC

## 2016-08-28 NOTE — Telephone Encounter (Signed)
Prep has been sent to [facility] listed in pts chart.

## 2016-08-31 ENCOUNTER — Encounter: Payer: Self-pay | Admitting: Gastroenterology

## 2016-08-31 ENCOUNTER — Ambulatory Visit (AMBULATORY_SURGERY_CENTER): Payer: BLUE CROSS/BLUE SHIELD | Admitting: Gastroenterology

## 2016-08-31 VITALS — BP 114/73 | HR 79 | Temp 96.8°F | Resp 14 | Ht 66.0 in | Wt 163.0 lb

## 2016-08-31 DIAGNOSIS — D123 Benign neoplasm of transverse colon: Secondary | ICD-10-CM | POA: Diagnosis not present

## 2016-08-31 DIAGNOSIS — Z1211 Encounter for screening for malignant neoplasm of colon: Secondary | ICD-10-CM | POA: Diagnosis present

## 2016-08-31 DIAGNOSIS — Z1212 Encounter for screening for malignant neoplasm of rectum: Secondary | ICD-10-CM | POA: Diagnosis not present

## 2016-08-31 MED ORDER — SODIUM CHLORIDE 0.9 % IV SOLN: 500.0000 mL | INTRAVENOUS | Status: DC

## 2016-08-31 NOTE — Progress Notes (Signed)
Pt's states no medical or surgical changes since previsit or office visit. 

## 2016-08-31 NOTE — Op Note (Signed)
[facility] Patient Name: Vicki Brown Procedure Date: 08/31/2016 10:32 AM MRN: [id number] Endoscopist: Mallie Mussel L. Loletha Carrow , MD Age: 56 Referring MD:  Date of Birth: Dec 22, 1960 Gender: Female Account #: [id number] Procedure:                Colonoscopy Indications:              Screening for colorectal malignant neoplasm, This                            is the patient's first colonoscopy Medicines:                Monitored Anesthesia Care Procedure:                Pre-Anesthesia Assessment:                           - Prior to the procedure, a History and Physical                            was performed, and patient medications and                            allergies were reviewed. The patient's tolerance of                            previous anesthesia was also reviewed. The risks                            and benefits of the procedure and the sedation                            options and risks were discussed with the patient.                            All questions were answered, and informed consent                            was obtained. Prior Anticoagulants: The patient has                            taken no previous anticoagulant or antiplatelet                            agents. ASA Grade Assessment: III - A patient with                            severe systemic disease. After reviewing the risks                            and benefits, the patient was deemed in                            satisfactory condition to undergo the procedure.  After obtaining informed consent, the colonoscope                            was passed under direct vision. Throughout the                            procedure, the patient's blood pressure, pulse, and                            oxygen saturations were monitored continuously. The                            Model PCF-H190DL [id number]) scope was introduced                            through the anus and  advanced to the the cecum,                            identified by appendiceal orifice and ileocecal                            valve. The colonoscopy was performed without                            difficulty. The patient tolerated the procedure                            well. The quality of the bowel preparation was                            good. The ileocecal valve, appendiceal orifice, and                            rectum were photographed. The quality of the bowel                            preparation was evaluated using the BBPS [facility]                            Bowel Preparation Scale) with scores of: Right                            Colon = 2, Transverse Colon = 3 and Left Colon = 2.                            The total BBPS score equals 7. The bowel                            preparation used was SUPREP. Scope In: 10:43:08 AM Scope Out: 10:53:40 AM Scope Withdrawal Time: 0 hours 8 minutes 7 seconds  Total Procedure Duration: 0 hours 10 minutes 32 seconds  Findings:                 The digital  rectal exam findings include decreased                            sphincter tone.                           A 6 mm polyp was found in the mid transverse colon.                            The polyp was sessile. The polyp was removed with a                            cold snare. Resection and retrieval were complete.                           The exam was otherwise without abnormality on                            direct and retroflexion views. Complications:            No immediate complications. Estimated Blood Loss:     Estimated blood loss: none. Impression:               - Decreased sphincter tone found on digital rectal                            exam.                           - One 6 mm polyp in the mid transverse colon,                            removed with a cold snare. Resected and retrieved.                           - The examination was otherwise normal on direct                             and retroflexion views. Recommendation:           - Patient has a contact number available for                            emergencies. The signs and symptoms of potential                            delayed complications were discussed with the                            patient. Return to normal activities tomorrow.                            Written discharge instructions were provided to the                            patient.                           -  Resume previous diet.                           - Continue present medications.                           - Await pathology results.                           - Repeat colonoscopy is recommended for                            surveillance. The colonoscopy date will be                            determined after pathology results from today's                            exam become available for review. Johnta Couts L. Loletha Carrow, MD 08/31/2016 10:56:19 AM This report has been signed electronically.

## 2016-08-31 NOTE — Patient Instructions (Signed)
YOU HAD AN ENDOSCOPIC PROCEDURE TODAY AT THE [facility]:   Refer to the procedure report that was given to you for any specific questions about what was found during the examination.  If the procedure report does not answer your questions, please call your gastroenterologist to clarify.  If you requested that your care partner not be given the details of your procedure findings, then the procedure report has been included in a sealed envelope for you to review at your convenience later.  YOU SHOULD EXPECT: Some feelings of bloating in the abdomen. Passage of more gas than usual.  Walking can help get rid of the air that was put into your GI tract during the procedure and reduce the bloating. If you had a lower endoscopy (such as a colonoscopy or flexible sigmoidoscopy) you may notice spotting of blood in your stool or on the toilet paper. If you underwent a bowel prep for your procedure, you may not have a normal bowel movement for a few days.  Please Note:  You might notice some irritation and congestion in your nose or some drainage.  This is from the oxygen used during your procedure.  There is no need for concern and it should clear up in a day or so.  SYMPTOMS TO REPORT IMMEDIATELY:   Following lower endoscopy (colonoscopy or flexible sigmoidoscopy):  Excessive amounts of blood in the stool  Significant tenderness or worsening of abdominal pains  Swelling of the abdomen that is new, acute  Fever of 100F or higher   For urgent or emergent issues, a gastroenterologist can be reached at any hour by calling [phone].   DIET:  We do recommend a small meal at first, but then you may proceed to your regular diet.  Drink plenty of fluids but you should avoid alcoholic beverages for 24 hours.  ACTIVITY:  You should plan to take it easy for the rest of today and you should NOT DRIVE or use heavy machinery until tomorrow (because of the sedation medicines used during the test).     FOLLOW UP: Our staff will call the number listed on your records the next business day following your procedure to check on you and address any questions or concerns that you may have regarding the information given to you following your procedure. If we do not reach you, we will leave a message.  However, if you are feeling well and you are not experiencing any problems, there is no need to return our call.  We will assume that you have returned to your regular daily activities without incident.  If any biopsies were taken you will be contacted by phone or by letter within the next 1-3 weeks.  Please call us at [phone] if you have not heard about the biopsies in 3 weeks.    SIGNATURES/CONFIDENTIALITY: You and/or your care partner have signed paperwork which will be entered into your electronic medical record.  These signatures attest to the fact that that the information above on your After Visit Summary has been reviewed and is understood.  Full responsibility of the confidentiality of this discharge information lies with you and/or your care-partner.  Polyp information given. 

## 2016-08-31 NOTE — Progress Notes (Signed)
Report given to PACU, vss 

## 2016-08-31 NOTE — Progress Notes (Signed)
Called to room to assist during endoscopic procedure.  Patient ID and intended procedure confirmed with present staff. Received instructions for my participation in the procedure from the performing physician.  

## 2016-09-01 ENCOUNTER — Telehealth: Payer: Self-pay

## 2016-09-01 NOTE — Telephone Encounter (Signed)
Number identifier, left a message. 

## 2016-09-01 NOTE — Telephone Encounter (Signed)
   Follow up Call-  Call back number 08/31/2016  Post procedure Call Back phone  # [phone]  Permission to leave phone message Yes  Some recent data might be hidden     Patient questions:  Do you have a fever, pain , or abdominal swelling? No. Pain Score  0 *  Have you tolerated food without any problems? Yes.    Have you been able to return to your normal activities? Yes.    Do you have any questions about your discharge instructions: Diet   No. Medications  No. Follow up visit  No.  Do you have questions or concerns about your Care? No.  Actions: * If pain score is 4 or above: No action needed, pain <4.

## 2016-09-10 ENCOUNTER — Encounter: Payer: Self-pay | Admitting: Gastroenterology

## 2016-11-24 ENCOUNTER — Other Ambulatory Visit: Payer: Self-pay | Admitting: Infectious Diseases

## 2016-11-24 DIAGNOSIS — B20 Human immunodeficiency virus [HIV] disease: Secondary | ICD-10-CM

## 2016-11-25 ENCOUNTER — Other Ambulatory Visit: Payer: BLUE CROSS/BLUE SHIELD

## 2016-12-09 ENCOUNTER — Ambulatory Visit: Payer: BLUE CROSS/BLUE SHIELD | Admitting: Infectious Diseases

## 2017-01-07 ENCOUNTER — Ambulatory Visit: Payer: BLUE CROSS/BLUE SHIELD | Admitting: Infectious Diseases

## 2017-01-25 ENCOUNTER — Other Ambulatory Visit: Payer: Self-pay | Admitting: *Deleted

## 2017-01-25 DIAGNOSIS — B20 Human immunodeficiency virus [HIV] disease: Secondary | ICD-10-CM

## 2017-01-25 MED ORDER — EFAVIRENZ-EMTRICITAB-TENOFOVIR 600-200-300 MG PO TABS: 1.0000 | ORAL_TABLET | Freq: Every day | ORAL | 0 refills | Status: DC

## 2017-01-27 ENCOUNTER — Encounter: Payer: Self-pay | Admitting: Infectious Diseases

## 2017-01-27 ENCOUNTER — Ambulatory Visit (INDEPENDENT_AMBULATORY_CARE_PROVIDER_SITE_OTHER): Payer: BLUE CROSS/BLUE SHIELD | Admitting: Infectious Diseases

## 2017-01-27 VITALS — BP 147/95 | HR 70 | Temp 98.4°F | Wt 164.0 lb

## 2017-01-27 DIAGNOSIS — Z23 Encounter for immunization: Secondary | ICD-10-CM

## 2017-01-27 DIAGNOSIS — I10 Essential (primary) hypertension: Secondary | ICD-10-CM

## 2017-01-27 DIAGNOSIS — E785 Hyperlipidemia, unspecified: Secondary | ICD-10-CM

## 2017-01-27 DIAGNOSIS — Z113 Encounter for screening for infections with a predominantly sexual mode of transmission: Secondary | ICD-10-CM | POA: Diagnosis not present

## 2017-01-27 DIAGNOSIS — B20 Human immunodeficiency virus [HIV] disease: Secondary | ICD-10-CM

## 2017-01-27 DIAGNOSIS — G4700 Insomnia, unspecified: Secondary | ICD-10-CM | POA: Diagnosis not present

## 2017-01-27 MED ORDER — ATORVASTATIN CALCIUM 10 MG PO TABS: 10.0000 mg | ORAL_TABLET | Freq: Every day | ORAL | 5 refills | Status: DC

## 2017-01-27 MED ORDER — LISINOPRIL-HYDROCHLOROTHIAZIDE 10-12.5 MG PO TABS: 1.0000 | ORAL_TABLET | Freq: Every day | ORAL | 3 refills | Status: DC

## 2017-01-27 MED ORDER — TRAZODONE HCL 50 MG PO TABS: 25.0000 mg | ORAL_TABLET | Freq: Every day | ORAL | 3 refills | Status: DC

## 2017-01-27 NOTE — Assessment & Plan Note (Signed)
Will give her rx for trazadone

## 2017-01-27 NOTE — Progress Notes (Signed)
   Subjective:    Patient ID: Vicki Brown, female    DOB: 1960/10/24, 56 y.o.   MRN: [id number]  HPI 56 yo F with HIV+ since 3-09. Has been on atripla.Has been taking lisinopril-hctz for her bp. Has been doing well with atripla. Needs refill of BP rx.    PAP- 10-2015 mammo- 11-2014 Colon- 08-2016 (1 polyp benign)  HIV 1 RNA Quant (copies/mL)  Date Value  04/22/2016 <20 DETECTED (A)  12/18/2015 <20  07/03/2015 37 (H)   CD4 T Cell Abs (/uL)  Date Value  04/22/2016 830  12/18/2015 1,020  07/03/2015 800    Review of Systems  Constitutional: Negative for appetite change, chills, fever and unexpected weight change.  Respiratory: Negative for shortness of breath.   Cardiovascular: Negative for chest pain.  Gastrointestinal: Negative for constipation and diarrhea.  Genitourinary: Negative for difficulty urinating and menstrual problem.  Neurological: Negative for headaches.  Psychiatric/Behavioral: Positive for sleep disturbance. Negative for dysphoric mood.  Please see HPI. All other systems reviewed and negative.      Objective:   Physical Exam  Constitutional: She appears well-developed and well-nourished.  HENT:  Mouth/Throat: No oropharyngeal exudate.  Eyes: EOM are normal. Pupils are equal, round, and reactive to light.  Neck: Neck supple.  Cardiovascular: Normal rate, regular rhythm and normal heart sounds.  Pulmonary/Chest: Effort normal and breath sounds normal.  Abdominal: Soft. Bowel sounds are normal. There is no tenderness. There is no rebound.  Musculoskeletal: She exhibits no edema.  Lymphadenopathy:    She has no cervical adenopathy.  Psychiatric: She has a normal mood and affect.      Assessment & Plan:

## 2017-01-27 NOTE — Assessment & Plan Note (Signed)
Her ascvd calculator is 7.5% Will start her on low dos lipitor rtc in 6 weeks with pharm to check lfts

## 2017-01-27 NOTE — Assessment & Plan Note (Signed)
Up today Will refill her rx

## 2017-01-27 NOTE — Assessment & Plan Note (Signed)
She is doing well Arrange PAP and Mammo Does not want to switch Flu vax today Given condoms rtc in 6 months

## 2017-02-24 ENCOUNTER — Ambulatory Visit (INDEPENDENT_AMBULATORY_CARE_PROVIDER_SITE_OTHER): Payer: BLUE CROSS/BLUE SHIELD | Admitting: Infectious Diseases

## 2017-02-24 ENCOUNTER — Other Ambulatory Visit (HOSPITAL_COMMUNITY)
Admission: RE | Admit: 2017-02-24 | Discharge: 2017-02-24 | Disposition: A | Discharge status: Home / Self Care | Payer: BLUE CROSS/BLUE SHIELD | Source: Ambulatory Visit | Attending: Infectious Diseases | Admitting: Infectious Diseases

## 2017-02-24 DIAGNOSIS — Z124 Encounter for screening for malignant neoplasm of cervix: Secondary | ICD-10-CM | POA: Diagnosis not present

## 2017-02-24 DIAGNOSIS — Z01419 Encounter for gynecological examination (general) (routine) without abnormal findings: Secondary | ICD-10-CM | POA: Diagnosis not present

## 2017-02-24 NOTE — Patient Instructions (Addendum)
Please call the [facility] to set up your mammogram.  [phone] [address], [address] [place], [place] [address]  

## 2017-02-24 NOTE — Progress Notes (Signed)
     Subjective:    Vicki Brown is a 57 y.o. G49P0 female here for an annual pelvic exam and pap smear.  No LMP recorded. Patient is postmenopausal.     Current GYN complaints or concerns: none   Past Medical History:  Diagnosis Date  . Allergy   . HIV infection ([place])   . Hypertension   . Insomnia 12/19/2014  . Trichomonas infection 12/19/2014  . Yeast infection 12/19/2014   Gynecologic History G2P0  No LMP recorded. Patient is postmenopausal. Contraception: condoms Last Pap: 10/2015. Results were: normal Last Mammogram: 11/2014. Results were: normal  Review of Systems Patient denies any abdominal/pelvic pain, problems with bowel movements, urination, vaginal discharge or intercourse.  Objective:  There were no vitals taken for this visit. Physical Exam  Constitutional: Well developed, well nourished, no acute distress. She is alert and oriented x3.  Pelvic: External genitalia is normal in appearance. The vagina is normal in appearance. The cervix is bulbous and easily visualized. No CMT, normal expected cervical mucus present. Bimanual exam reveals uterus that is felt to be normal size, shape, and contour. No adnexal masses or tenderness noted. Psych: She has a normal mood and affect.    Assessment:  Normal pelvic exam. Thin prep pap was obtained and sent for cytology, HPV and GC/C today.   Plan:  Health Maintenance = Return in 1 year for annual pap screening unless indicated sooner. Mammography to be scheduled - I have provided her with the contact information today.   HIV = F/U as scheduled with Dr. Johnnye Sima for ongoing HIV care.   Vicki Madeira, MSN, NP-C [facility]  02/24/2017 2:21 PM

## 2017-02-26 LAB — CYTOLOGY - PAP
Chlamydia: NEGATIVE
Diagnosis: NEGATIVE
Neisseria Gonorrhea: NEGATIVE

## 2017-03-10 ENCOUNTER — Ambulatory Visit: Payer: BLUE CROSS/BLUE SHIELD

## 2017-03-15 ENCOUNTER — Encounter: Payer: Self-pay | Admitting: Infectious Diseases

## 2017-03-24 ENCOUNTER — Ambulatory Visit (INDEPENDENT_AMBULATORY_CARE_PROVIDER_SITE_OTHER): Payer: BLUE CROSS/BLUE SHIELD | Admitting: Pharmacist Clinician (PhC)/ Clinical Pharmacy Specialist

## 2017-03-24 DIAGNOSIS — B20 Human immunodeficiency virus [HIV] disease: Secondary | ICD-10-CM | POA: Diagnosis not present

## 2017-03-24 MED ORDER — BICTEGRAVIR-EMTRICITAB-TENOFOV 50-200-25 MG PO TABS: 1.0000 | ORAL_TABLET | Freq: Every day | ORAL | 5 refills | Status: DC

## 2017-03-24 NOTE — Patient Instructions (Signed)
Stop Atripla Start Biktarvy 1 daily Come back and see [place] at the end of February

## 2017-03-24 NOTE — Progress Notes (Signed)
HPI: JOYELL EMAMI is a 57 y.o. female who is here to see pharmacy for her f/u to get some labs.   Allergies: No Known Allergies  Vitals:    Past Medical History: Past Medical History:  Diagnosis Date  . Allergy   . HIV infection ([place])   . Hypertension   . Insomnia 12/19/2014  . Trichomonas infection 12/19/2014  . Yeast infection 12/19/2014    Social History: Social History   Socioeconomic History  . Marital status: Single    Spouse name: Not on file  . Number of children: Not on file  . Years of education: Not on file  . Highest education level: Not on file  Social Needs  . Financial resource strain: Not on file  . Food insecurity - worry: Not on file  . Food insecurity - inability: Not on file  . Transportation needs - medical: Not on file  . Transportation needs - non-medical: Not on file  Occupational History  . Not on file  Tobacco Use  . Smoking status: Former Smoker    Types: Cigarettes  . Smokeless tobacco: Never Used  . Tobacco comment: 20 years ago  Substance and Sexual Activity  . Alcohol use: Yes    Alcohol/week: 0.6 oz    Types: 1 Cans of beer per week    Comment: sometimes daily  . Drug use: No    Comment: stopped marijuana  . Sexual activity: Yes    Partners: Male    Birth control/protection: Condom    Comment: accepted condoms  Other Topics Concern  . Not on file  Social History Narrative  . Not on file    Previous Regimen: [place]  Current Regimen: ATP  Labs: HIV 1 RNA Quant (copies/mL)  Date Value  04/22/2016 <20 DETECTED (A)  12/18/2015 <20  07/03/2015 37 (H)   CD4 T Cell Abs (/uL)  Date Value  04/22/2016 830  12/18/2015 1,020  07/03/2015 800   Hep B S Ab (no units)  Date Value  03/20/2010 POS (A)   Hepatitis B Surface Ag (no units)  Date Value  03/20/2010 NEGATIVE   HCV Ab (no units)  Date Value  03/20/2010 NEGATIVE    CrCl: CrCl cannot be calculated (Patient's most recent lab result is older than the  maximum 21 days allowed.).  Lipids:    Component Value Date/Time   CHOL 202 (H) 04/22/2016 1344   TRIG 64 04/22/2016 1344   HDL 73 04/22/2016 1344   CHOLHDL 2.8 04/22/2016 1344   VLDL 13 04/22/2016 1344   LDLCALC 116 (H) 04/22/2016 1344    Assessment: [name] recently saw Dr. Johnnye Sima in Nov and she was started on lipitor due to her risk factor for CAD. She is here today to f/u with pharmacy so we can get her LFTs. Her cholesterol is pretty normal. The efavirenz in ATP could affect her lipids also. Therefore, offered her a switch to [place] to avoid any dyslipidemia in the future. She agreed to it. We will only get the CMP today only. She will come back in 6 wks for labs. Since she has ICAP, we will send the rx to [facility].   Recommendations:  Stop ATP Start Biktarvy 1 PO qday F/u in 6 wks for repeated labs F/u with Dr. Johnnye Sima in [facility], PharmD, BCPS, AAHIVP, [facility] 03/24/2017, 1:26 PM

## 2017-03-25 LAB — COMPLETE METABOLIC PANEL WITH GFR
AG Ratio: 1.2 (calc) (ref 1.0–2.5)
ALT: 18 U/L (ref 6–29)
AST: 24 U/L (ref 10–35)
Albumin: 3.8 g/dL (ref 3.6–5.1)
Alkaline phosphatase (APISO): 88 U/L (ref 33–130)
BUN: 8 mg/dL (ref 7–25)
CALCIUM: 9.1 mg/dL (ref 8.6–10.4)
CHLORIDE: 106 mmol/L (ref 98–110)
CO2: 27 mmol/L (ref 20–32)
Creat: 0.9 mg/dL (ref 0.50–1.05)
GFR, EST AFRICAN AMERICAN: 82 mL/min/{1.73_m2} (ref >=60)
GFR, Est Non African American: 70 mL/min/{1.73_m2} (ref >=60)
Globulin: 3.2 g/dL (calc) (ref 1.9–3.7)
Glucose, Bld: 86 mg/dL (ref 65–99)
Potassium: 3.9 mmol/L (ref 3.5–5.3)
Sodium: 139 mmol/L (ref 135–146)
TOTAL PROTEIN: 7 g/dL (ref 6.1–8.1)
Total Bilirubin: 0.2 mg/dL (ref 0.2–1.2)

## 2017-05-06 ENCOUNTER — Ambulatory Visit: Payer: BLUE CROSS/BLUE SHIELD

## 2017-05-12 ENCOUNTER — Ambulatory Visit (INDEPENDENT_AMBULATORY_CARE_PROVIDER_SITE_OTHER): Payer: BLUE CROSS/BLUE SHIELD | Admitting: Pharmacist

## 2017-05-12 DIAGNOSIS — B20 Human immunodeficiency virus [HIV] disease: Secondary | ICD-10-CM | POA: Diagnosis not present

## 2017-05-12 DIAGNOSIS — E785 Hyperlipidemia, unspecified: Secondary | ICD-10-CM

## 2017-05-12 LAB — COMPREHENSIVE METABOLIC PANEL
AG Ratio: 1.3 (calc) (ref 1.0–2.5)
ALBUMIN MSPROF: 4.1 g/dL (ref 3.6–5.1)
ALKALINE PHOSPHATASE (APISO): 67 U/L (ref 33–130)
ALT: 16 U/L (ref 6–29)
AST: 28 U/L (ref 10–35)
BILIRUBIN TOTAL: 0.5 mg/dL (ref 0.2–1.2)
BUN: 14 mg/dL (ref 7–25)
CHLORIDE: 106 mmol/L (ref 98–110)
CO2: 26 mmol/L (ref 20–32)
Calcium: 9.1 mg/dL (ref 8.6–10.4)
Creat: 0.97 mg/dL (ref 0.50–1.05)
GLOBULIN: 3.2 g/dL (ref 1.9–3.7)
Glucose, Bld: 88 mg/dL (ref 65–99)
POTASSIUM: 3.8 mmol/L (ref 3.5–5.3)
Sodium: 139 mmol/L (ref 135–146)
Total Protein: 7.3 g/dL (ref 6.1–8.1)

## 2017-05-12 LAB — LIPID PANEL
CHOLESTEROL: 203 mg/dL — AB (ref <200)
HDL: 92 mg/dL (ref >50)
LDL Cholesterol (Calc): 94 mg/dL (calc)
Non-HDL Cholesterol (Calc): 111 mg/dL (calc) (ref <130)
Total CHOL/HDL Ratio: 2.2 (calc) (ref <5.0)
Triglycerides: 82 mg/dL (ref <150)

## 2017-05-12 NOTE — Progress Notes (Signed)
[facility] Pharmacy Visit  HPI: Vicki Brown is a 57 y.o. female who presents to the [facility] for HIV follow-up after switching medications.  Patient Active Problem List   Diagnosis Date Noted  . Right wrist pain 07/03/2015  . Insomnia 12/19/2014  . Allergic rhinitis 12/28/2012  . Transaminitis 11/03/2010  . Dyslipidemia 07/31/2010  . Hepatitis B immune 03/20/2010  . Human immunodeficiency virus (HIV) disease ([place]) 01/19/2008  . PERIPHERAL NEUROPATHY 01/19/2008  . Essential hypertension 01/19/2008    Patient's Medications  New Prescriptions   No medications on file  Previous Medications   ATORVASTATIN (LIPITOR) 10 MG TABLET    Take 1 tablet (10 mg total) by mouth daily.   BICTEGRAVIR-EMTRICITABINE-TENOFOVIR AF (BIKTARVY) 50-200-25 MG TABS TABLET    Take 1 tablet by mouth daily.   IBUPROFEN (ADVIL,MOTRIN) 800 MG TABLET    Take 1 tablet (800 mg total) by mouth 2 (two) times daily as needed (take with food).   LISINOPRIL-HYDROCHLOROTHIAZIDE (ZESTORETIC) 10-12.5 MG TABLET    Take 1 tablet by mouth daily.   TRAZODONE (DESYREL) 50 MG TABLET    Take 0.5 tablets (25 mg total) by mouth at bedtime.  Modified Medications   No medications on file  Discontinued Medications   No medications on file    Allergies: No Known Allergies  Past Medical History: Past Medical History:  Diagnosis Date  . Allergy   . HIV infection ([place])   . Hypertension   . Insomnia 12/19/2014  . Trichomonas infection 12/19/2014  . Yeast infection 12/19/2014    Social History: Social History   Socioeconomic History  . Marital status: Single    Spouse name: Not on file  . Number of children: Not on file  . Years of education: Not on file  . Highest education level: Not on file  Social Needs  . Financial resource strain: Not on file  . Food insecurity - worry: Not on file  . Food insecurity - inability: Not on file  . Transportation needs - medical: Not on file   . Transportation needs - non-medical: Not on file  Occupational History  . Not on file  Tobacco Use  . Smoking status: Former Smoker    Types: Cigarettes  . Smokeless tobacco: Never Used  . Tobacco comment: 20 years ago  Substance and Sexual Activity  . Alcohol use: Yes    Alcohol/week: 0.6 oz    Types: 1 Cans of beer per week    Comment: sometimes daily  . Drug use: No    Comment: stopped marijuana  . Sexual activity: Yes    Partners: Male    Birth control/protection: Condom    Comment: accepted condoms  Other Topics Concern  . Not on file  Social History Narrative  . Not on file    Labs: HIV 1 RNA Quant (copies/mL)  Date Value  04/22/2016 <20 DETECTED (A)  12/18/2015 <20  07/03/2015 37 (H)   CD4 T Cell Abs (/uL)  Date Value  04/22/2016 830  12/18/2015 1,020  07/03/2015 800   Hep B S Ab (no units)  Date Value  03/20/2010 POS (A)   Hepatitis B Surface Ag (no units)  Date Value  03/20/2010 NEGATIVE   HCV Ab (no units)  Date Value  03/20/2010 NEGATIVE    Lipids:    Component Value Date/Time   CHOL 202 (H) 04/22/2016 1344   TRIG 64 04/22/2016 1344   HDL 73 04/22/2016 1344   CHOLHDL 2.8 04/22/2016  1344   VLDL 13 04/22/2016 1344   LDLCALC 116 (H) 04/22/2016 1344    Current HIV Regimen: Biktarvy  Assessment: Vicki Brown is here today to follow-up for her HIV infection and cholesterol management.  She last saw Vicki Brown back in January and he switched her from [place] to Vicki Brown.  She states that she is tolerating the Vicki Brown very well but has occasional headaches. I told her they should resolve soon but she could take OTC medication for headaches if she needed to.  No other issues and no missed doses. She likes the switch. She is not having any muscle pain or problems with the Lipitor.  I will check a HIV viral load today since she switched recently, as well as a lipid panel and CMET.  Her CMET was normal when checked 6 weeks ago.  She follows up with Dr.  Johnnye Brown in May.  Plan: - Continue Biktarvy PO once daily - HIV viral load, CMET, and lipid profile today - F/u with Dr. Johnnye Brown 5/22 at [facility]. Vicki Brown, PharmD, [facility], [facility] 05/12/2017, 11:18 AM

## 2017-05-14 LAB — HIV-1 RNA QUANT-NO REFLEX-BLD
HIV 1 RNA Quant: <20 copies/mL
HIV-1 RNA QUANT, LOG: NOT DETECTED {Log_copies}/mL

## 2017-06-07 ENCOUNTER — Telehealth: Payer: Self-pay | Admitting: *Deleted

## 2017-06-07 NOTE — Telephone Encounter (Signed)
Patient left message asking for refill of her ibuprofen 800 mg, last written 2017. Next appointment 5/22. Landis Gandy, RN

## 2017-06-08 ENCOUNTER — Other Ambulatory Visit: Payer: Self-pay | Admitting: *Deleted

## 2017-06-08 DIAGNOSIS — M25531 Pain in right wrist: Secondary | ICD-10-CM

## 2017-06-08 MED ORDER — IBUPROFEN 800 MG PO TABS: 800.0000 mg | ORAL_TABLET | Freq: Two times a day (BID) | ORAL | 0 refills | Status: DC | PRN

## 2017-06-08 NOTE — Telephone Encounter (Signed)
Ok to refill   thanks

## 2017-07-12 ENCOUNTER — Other Ambulatory Visit: Payer: Self-pay | Admitting: Infectious Diseases

## 2017-07-14 ENCOUNTER — Other Ambulatory Visit: Payer: BLUE CROSS/BLUE SHIELD

## 2017-07-14 DIAGNOSIS — Z113 Encounter for screening for infections with a predominantly sexual mode of transmission: Secondary | ICD-10-CM | POA: Diagnosis not present

## 2017-07-14 DIAGNOSIS — E785 Hyperlipidemia, unspecified: Secondary | ICD-10-CM

## 2017-07-14 DIAGNOSIS — B20 Human immunodeficiency virus [HIV] disease: Secondary | ICD-10-CM | POA: Diagnosis not present

## 2017-07-15 LAB — URINE CYTOLOGY ANCILLARY ONLY
CHLAMYDIA, DNA PROBE: NEGATIVE
Neisseria Gonorrhea: NEGATIVE

## 2017-07-15 LAB — T-HELPER CELL (CD4) - (RCID CLINIC ONLY)
CD4 T CELL HELPER: 60 % — AB (ref 33–55)
CD4 T Cell Abs: 860 /uL (ref 400–2700)

## 2017-07-16 LAB — COMPREHENSIVE METABOLIC PANEL
AG Ratio: 1.2 (calc) (ref 1.0–2.5)
ALT: 17 U/L (ref 6–29)
AST: 26 U/L (ref 10–35)
Albumin: 3.8 g/dL (ref 3.6–5.1)
Alkaline phosphatase (APISO): 69 U/L (ref 33–130)
BUN: 10 mg/dL (ref 7–25)
CO2: 26 mmol/L (ref 20–32)
Calcium: 8.9 mg/dL (ref 8.6–10.4)
Chloride: 107 mmol/L (ref 98–110)
Creat: 1.01 mg/dL (ref 0.50–1.05)
GLUCOSE: 95 mg/dL (ref 65–99)
Globulin: 3.1 g/dL (calc) (ref 1.9–3.7)
Potassium: 4.1 mmol/L (ref 3.5–5.3)
SODIUM: 140 mmol/L (ref 135–146)
TOTAL PROTEIN: 6.9 g/dL (ref 6.1–8.1)
Total Bilirubin: 0.3 mg/dL (ref 0.2–1.2)

## 2017-07-16 LAB — CBC
HCT: 34.1 % — ABNORMAL LOW (ref 35.0–45.0)
Hemoglobin: 11.6 g/dL — ABNORMAL LOW (ref 11.7–15.5)
MCH: 32.2 pg (ref 27.0–33.0)
MCHC: 34 g/dL (ref 32.0–36.0)
MCV: 94.7 fL (ref 80.0–100.0)
MPV: 9.4 fL (ref 7.5–12.5)
Platelets: 212 10*3/uL (ref 140–400)
RBC: 3.6 10*6/uL — ABNORMAL LOW (ref 3.80–5.10)
RDW: 12.9 % (ref 11.0–15.0)
WBC: 2.6 10*3/uL — ABNORMAL LOW (ref 3.8–10.8)

## 2017-07-16 LAB — LIPID PANEL
CHOL/HDL RATIO: 2.6 (calc) (ref <5.0)
Cholesterol: 225 mg/dL — ABNORMAL HIGH (ref <200)
HDL: 88 mg/dL (ref >50)
LDL CHOLESTEROL (CALC): 122 mg/dL — AB
Non-HDL Cholesterol (Calc): 137 mg/dL (calc) — ABNORMAL HIGH (ref <130)
Triglycerides: 59 mg/dL (ref <150)

## 2017-07-16 LAB — RPR: RPR Ser Ql: NONREACTIVE

## 2017-07-16 LAB — HIV-1 RNA QUANT-NO REFLEX-BLD
HIV 1 RNA QUANT: DETECTED {copies}/mL — AB
HIV-1 RNA QUANT, LOG: DETECTED {Log_copies}/mL — AB

## 2017-07-28 ENCOUNTER — Encounter: Payer: Self-pay | Admitting: Infectious Diseases

## 2017-07-28 ENCOUNTER — Ambulatory Visit (INDEPENDENT_AMBULATORY_CARE_PROVIDER_SITE_OTHER): Payer: BLUE CROSS/BLUE SHIELD | Admitting: Infectious Diseases

## 2017-07-28 VITALS — BP 148/93 | HR 69 | Temp 98.3°F | Ht 66.0 in | Wt 165.1 lb

## 2017-07-28 DIAGNOSIS — E785 Hyperlipidemia, unspecified: Secondary | ICD-10-CM | POA: Diagnosis not present

## 2017-07-28 DIAGNOSIS — B20 Human immunodeficiency virus [HIV] disease: Secondary | ICD-10-CM | POA: Diagnosis not present

## 2017-07-28 DIAGNOSIS — Z113 Encounter for screening for infections with a predominantly sexual mode of transmission: Secondary | ICD-10-CM

## 2017-07-28 DIAGNOSIS — I10 Essential (primary) hypertension: Secondary | ICD-10-CM

## 2017-07-28 NOTE — Assessment & Plan Note (Signed)
Has quit statin, LDL near goal? Will readdress in future.

## 2017-07-28 NOTE — Assessment & Plan Note (Signed)
She will continue her anti-htn rx Will have her seen by CV for stress test (has multiple risk factors although pain is atypical)

## 2017-07-28 NOTE — Progress Notes (Signed)
   Subjective:    Patient ID: Vicki Brown, female    DOB: 13-Oct-1960, 57 y.o.   MRN: [id number]  HPI 57yo F with HIV+ since 3-09. Has been on atripla until changed to biktarvy 03-2017.Has been taking lisinopril-hctz for her bp. Also on statin/lipitor for lipids.  She quit lipitor due to headaches and chest pain. These have resolved since she stopped.  Has been careful with diet. No exercise except exertion at work.  Has occas sharp CP- upper anterior, occas in back, not into arm or neck. No diaphoresis. Worse with deep breath.   HIV 1 RNA Quant (copies/mL)  Date Value  07/14/2017 <20 DETECTED (A)  05/12/2017 <20 NOT DETECTED  04/22/2016 <20 DETECTED (A)   CD4 T Cell Abs (/uL)  Date Value  07/14/2017 860  04/22/2016 830  12/18/2015 1,020   Lab Results  Component Value Date   CHOL 225 (H) 07/14/2017   HDL 88 07/14/2017   LDLCALC 122 (H) 07/14/2017   TRIG 59 07/14/2017   CHOLHDL 2.6 07/14/2017     Review of Systems  Constitutional: Negative for appetite change and unexpected weight change.  Respiratory: Negative for shortness of breath.   Cardiovascular: Positive for chest pain.  Gastrointestinal: Negative for constipation and diarrhea.  Genitourinary: Negative for difficulty urinating.  Neurological: Positive for headaches.  repeat colon due in 5 yrs (2023) Needs mammo Next pap 02-2018 Has not taken anti-htn yet today.  Please see HPI. All other systems reviewed and negative.      Objective:   Physical Exam  Constitutional: She is oriented to person, place, and time. She appears well-developed and well-nourished.  HENT:  Mouth/Throat: No oropharyngeal exudate.  Eyes: Pupils are equal, round, and reactive to light. EOM are normal.  Neck: Normal range of motion. Neck supple.  Cardiovascular: Normal rate, regular rhythm and normal heart sounds.  Pulmonary/Chest: Effort normal and breath sounds normal.  Abdominal: Soft. Bowel sounds are normal. She exhibits no  distension. There is no tenderness.  Musculoskeletal: She exhibits no edema.  Neurological: She is alert and oriented to person, place, and time.       Assessment & Plan:

## 2017-07-28 NOTE — Assessment & Plan Note (Signed)
Her PAP is uptodate She is doing well on biktarvy Offered/refused condoms.  Refuses prevnar, Tdap.  Will get mammo rtc in 9 months

## 2017-08-02 ENCOUNTER — Other Ambulatory Visit: Payer: Self-pay | Admitting: Infectious Diseases

## 2017-08-02 DIAGNOSIS — B20 Human immunodeficiency virus [HIV] disease: Secondary | ICD-10-CM

## 2017-09-29 ENCOUNTER — Encounter: Payer: Self-pay | Admitting: *Deleted

## 2017-10-13 ENCOUNTER — Other Ambulatory Visit: Payer: Self-pay | Admitting: Infectious Diseases

## 2017-10-13 ENCOUNTER — Telehealth: Payer: Self-pay | Admitting: Behavioral Health

## 2017-10-13 DIAGNOSIS — M25531 Pain in right wrist: Secondary | ICD-10-CM

## 2017-10-13 NOTE — Telephone Encounter (Signed)
Patient called stating she got a call form [facility] but was unaware what time her upcoming appointment was or the address.  Gave her the date, time, address and phone number. Pricilla Riffle RN

## 2017-10-21 ENCOUNTER — Ambulatory Visit (INDEPENDENT_AMBULATORY_CARE_PROVIDER_SITE_OTHER): Payer: BLUE CROSS/BLUE SHIELD | Admitting: Internal Medicine

## 2017-10-21 ENCOUNTER — Encounter: Payer: Self-pay | Admitting: Internal Medicine

## 2017-10-21 VITALS — BP 146/102 | HR 96 | Ht 66.0 in | Wt 165.8 lb

## 2017-10-21 DIAGNOSIS — I10 Essential (primary) hypertension: Secondary | ICD-10-CM

## 2017-10-21 DIAGNOSIS — Z789 Other specified health status: Secondary | ICD-10-CM | POA: Diagnosis not present

## 2017-10-21 DIAGNOSIS — R0602 Shortness of breath: Secondary | ICD-10-CM | POA: Diagnosis not present

## 2017-10-21 DIAGNOSIS — R079 Chest pain, unspecified: Secondary | ICD-10-CM

## 2017-10-21 DIAGNOSIS — B20 Human immunodeficiency virus [HIV] disease: Secondary | ICD-10-CM | POA: Diagnosis not present

## 2017-10-21 DIAGNOSIS — R0789 Other chest pain: Secondary | ICD-10-CM | POA: Diagnosis not present

## 2017-10-21 DIAGNOSIS — I4891 Unspecified atrial fibrillation: Secondary | ICD-10-CM | POA: Diagnosis not present

## 2017-10-21 DIAGNOSIS — E785 Hyperlipidemia, unspecified: Secondary | ICD-10-CM | POA: Diagnosis not present

## 2017-10-21 MED ORDER — METOPROLOL TARTRATE 25 MG PO TABS: 25.0000 mg | ORAL_TABLET | Freq: Two times a day (BID) | ORAL | 3 refills | Status: DC

## 2017-10-21 MED ORDER — RIVAROXABAN 20 MG PO TABS: 20.0000 mg | ORAL_TABLET | Freq: Every day | ORAL | 3 refills | Status: DC

## 2017-10-21 NOTE — Patient Instructions (Signed)
Medication Instructions:  START Metoprolol Tartrate 25 mg twice per day START Xarelto 20 mg daily  -- If you need a refill on your cardiac medications before your next appointment, please call your pharmacy. --  Labwork: CBC/BMP/MAGNESIUM/TSH  Testing/Procedures: Your physician has requested that you have an echocardiogram. Echocardiography is a painless test that uses sound waves to create images of your heart. It provides your doctor with information about the size and shape of your heart and how well your heart's chambers and valves are working. This procedure takes approximately one hour. There are no restrictions for this procedure.  Your physician has requested that you have a lexiscan myoview. For further information please visit [email or web address]. Please follow instruction sheet, as given.    Follow-Up: Your physician wants you to follow-up in: 1 MONTH with APP   Thank you for choosing [facility]!!    Any Other Special Instructions Will Be Listed Below (If Applicable).

## 2017-10-21 NOTE — Progress Notes (Signed)
New Outpatient Visit Date: 10/21/2017  Referring Provider: Campbell Riches, MD [address] [place], [place] [address]  Chief Complaint: Chest pain  HPI:  Vicki Brown is a 57 y.o. female who is being seen today for the evaluation of chest pain and hypertension at the request of Dr. Johnnye Sima. She has a history of HIV, hypertension, and hyperlipidemia.  She was seen by Dr. Johnnye Sima in May, at which time she reported occasional sharp upper chest pain, also occasionally in the back.  Today, Ms. Simcoe reports that she continues to have intermittent chest tightness occurring about 3 times a week.  Notes accompanying shortness of breath and palpitations.  Pain is sometimes related to eating or walking, though not every time that she is active.  Its maximal intensity is 6/10, typically lasting "a good while."  Pain is been present for several months and seems to be stable in frequency and intensity.  She denies lightheadedness as well as edema, orthopnea, and PND.  Ms. Toren denies a history of prior cardiac disease or testing, though echocardiogram (detailed below) was performed in 2016.  She notes occasional scant bright red blood per rectum, with colonoscopy last year showing a single polyp that was removed.  --------------------------------------------------------------------------------------------------  Cardiovascular History & Procedures: Cardiovascular Problems:  Atypical chest pain  Atrial fibrillation  Risk Factors:  Hypertension, hyperlipidemia, and HIV  Cath/PCI:  None  CV Surgery:  None  EP Procedures and Devices:  None  Non-Invasive Evaluation(s):  Echocardiogram (04/11/2014): Normal LV size and wall thickness with LVEF of 55-60%.  Grade 1 diastolic dysfunction.  Mild tricuspid regurgitation.  Normal RV size and function.  Normal pulmonary artery pressure.  Recent CV Pertinent Labs: Lab Results  Component Value Date   CHOL 225 (H) 07/14/2017   HDL 88  07/14/2017   LDLCALC 122 (H) 07/14/2017   TRIG 59 07/14/2017   CHOLHDL 2.6 07/14/2017   K 4.1 07/14/2017   BUN 10 07/14/2017   CREATININE 1.01 07/14/2017    --------------------------------------------------------------------------------------------------  Past Medical History:  Diagnosis Date  . Allergy   . HIV infection ([place])   . Hyperlipidemia   . Hypertension   . Insomnia 12/19/2014  . Trichomonas infection 12/19/2014  . Yeast infection 12/19/2014    Past Surgical History:  Procedure Laterality Date  . Broken Arm Left    Pins placed in arm  . TUBAL LIGATION      Current Meds  Medication Sig  . atorvastatin (LIPITOR) 10 MG tablet Take 1 tablet (10 mg total) by mouth daily.  [name] BIKTARVY 50-200-25 MG TABS tablet TAKE 1 TABLET BY MOUTH DAILY  . ibuprofen (ADVIL,MOTRIN) 800 MG tablet TAKE 1 TABLET(800 MG) BY MOUTH TWICE DAILY WITH FOOD AS NEEDED  . lisinopril-hydrochlorothiazide (ZESTORETIC) 10-12.5 MG tablet Take 1 tablet by mouth daily.  . traZODone (DESYREL) 50 MG tablet Take 0.5 tablets (25 mg total) by mouth at bedtime.   Current Facility-Administered Medications for the 10/21/17 encounter (Office Visit) with Orianna Biskup, Harrell Gave, MD  Medication  . 0.9 %  sodium chloride infusion    Allergies: Patient has no known allergies.  Social History   Tobacco Use  . Smoking status: Former Smoker    Types: Cigarettes  . Smokeless tobacco: Never Used  . Tobacco comment: 20 years ago  Substance Use Topics  . Alcohol use: Yes    Alcohol/week: 1.0 standard drinks    Types: 1 Cans of beer per week    Comment: sometimes daily  . Drug  use: No    Frequency: 1.0 times per week    Comment: stopped marijuana    Family History  Problem Relation Age of Onset  . Cancer Mother        uterine vs ovarian?  . Seizures Father   . Mental illness Brother   . Breast cancer Maternal Aunt   . Diabetes Daughter   . Colon polyps Neg Hx   . Esophageal cancer Neg Hx   . Rectal cancer  Neg Hx   . Stomach cancer Neg Hx     Review of Systems: A 12-system review of systems was performed and was negative except as noted in the HPI.  --------------------------------------------------------------------------------------------------  Physical Exam: BP (!) 146/102   Pulse 96   Ht 5\' 6"  (1.676 m)   Wt 165 lb 12.8 oz (75.2 kg)   SpO2 96%   BMI 26.76 kg/m   General: NAD. HEENT: No conjunctival pallor or scleral icterus. Moist mucous membranes. OP clear. Neck: Supple without lymphadenopathy, thyromegaly, JVD, or HJR. No carotid bruit. Lungs: Normal work of breathing. Clear to auscultation bilaterally without wheezes or crackles. Heart: Irregularly irregular rhythm without murmurs, rubs, or gallops. Non-displaced PMI. Abd: Bowel sounds present. Soft, NT/ND without hepatosplenomegaly Ext: No lower extremity edema. Radial, PT, and DP pulses are 2+ bilaterally Skin: Warm and dry without rash. Neuro: CNIII-XII intact. Strength and fine-touch sensation intact in upper and lower extremities bilaterally. Psych: Normal mood and affect.  EKG: Atrial fibrillation (ventricular rate 96 bpm) with borderline QT prolongation.  Lab Results  Component Value Date   WBC 2.6 (L) 07/14/2017   HGB 11.6 (L) 07/14/2017   HCT 34.1 (L) 07/14/2017   MCV 94.7 07/14/2017   PLT 212 07/14/2017    Lab Results  Component Value Date   NA 140 07/14/2017   K 4.1 07/14/2017   CL 107 07/14/2017   CO2 26 07/14/2017   BUN 10 07/14/2017   CREATININE 1.01 07/14/2017   GLUCOSE 95 07/14/2017   ALT 17 07/14/2017    Lab Results  Component Value Date   CHOL 225 (H) 07/14/2017   HDL 88 07/14/2017   LDLCALC 122 (H) 07/14/2017   TRIG 59 07/14/2017   CHOLHDL 2.6 07/14/2017     --------------------------------------------------------------------------------------------------  ASSESSMENT AND PLAN: Chest pain shortness of breath Symptoms could be due to a number of factors.  I am most suspicious  that her chest pain and shortness of breath are related to atrial fibrillation with intermittent rapid ventricular response.  Underlying coronary artery disease is also consideration, with multiple risk factors noted including hypertension, hyperlipidemia, and HIV.  We will manage her atrial fibrillation, as outlined below.  I have recommended that we obtain a transthoracic echocardiogram and pharmacologic myocardial perfusion stress test.  I will not start aspirin, given that the patient will be placed on therapeutic anticoagulation for her atrial fibrillation.  Will start Toprol tartrate 25 mg twice daily for heart rate control as well as antianginal therapy.  Atrial fibrillation Duration is uncertain, though I am concerned it may have been going on for at least a few months and underlying the aforementioned chest pain and shortness of breath.  Heart rate at rest is upper normal.  Given her CHADSVASC score of 2 (hypertension and gender), I have recommended therapeutic anticoagulation.  We have agreed to start rivaroxaban 20 mg daily.  I will also place her on metoprolol tartrate 25 mg daily for rate control as well as antianginal therapy.  She will return in  a month to reassess her symptoms and rhythm.  If she remains in atrial fibrillation at that time, cardioversion could be considered.  I will check a CBC, CMP, magnesium level, and TSH today, as well as echocardiogram.  Hypertension Blood pressure mildly elevated today.  I will add metoprolol tartrate 25 mg twice daily to her current regimen of lisinopril/HCTZ.  Hyperlipidemia Continue atorvastatin 10 mg daily.  Follow-up: Return to clinic in 1 month. Nelva Bush, MD 10/21/2017 8:56 AM

## 2017-10-22 ENCOUNTER — Telehealth: Payer: Self-pay | Admitting: Internal Medicine

## 2017-10-22 LAB — MAGNESIUM: Magnesium: 2.1 mg/dL (ref 1.6–2.3)

## 2017-10-22 LAB — CBC
Hematocrit: 39.3 % (ref 34.0–46.6)
Hemoglobin: 13.2 g/dL (ref 11.1–15.9)
MCH: 32.8 pg (ref 26.6–33.0)
MCHC: 33.6 g/dL (ref 31.5–35.7)
MCV: 98 fL — AB (ref 79–97)
PLATELETS: 240 10*3/uL (ref 150–450)
RBC: 4.03 x10E6/uL (ref 3.77–5.28)
RDW: 14 % (ref 12.3–15.4)
WBC: 3.2 10*3/uL — ABNORMAL LOW (ref 3.4–10.8)

## 2017-10-22 LAB — TSH: TSH: 0.274 u[IU]/mL — ABNORMAL LOW (ref 0.450–4.500)

## 2017-10-22 NOTE — Telephone Encounter (Signed)
New Message   Patient is returning call in reference to lab results. Please call to discuss.  

## 2017-10-22 NOTE — Telephone Encounter (Signed)
Notes recorded by Nuala Alpha, LPN on 2/30/0979 at 4:99 PM EDT Spoke with our Lab Leim Fabry, and to follow-up on pending BMET. Per Lanny Hurst, this specimen was collected but never released.  Per Lanny Hurst, he will release this now and contact the lab to add on.

## 2017-10-22 NOTE — Telephone Encounter (Signed)
Pt aware of lab results and pending BMP results; no additional questions.

## 2017-10-22 NOTE — Addendum Note (Signed)
Addended by: Eulis Foster on: 10/22/2017 01:32 PM   Modules accepted: Orders

## 2017-10-25 ENCOUNTER — Telehealth (HOSPITAL_COMMUNITY): Payer: Self-pay | Admitting: *Deleted

## 2017-10-25 LAB — SPECIMEN STATUS REPORT

## 2017-10-25 LAB — BASIC METABOLIC PANEL
BUN/Creatinine Ratio: 12 (ref 9–23)
BUN: 14 mg/dL (ref 6–24)
CALCIUM: 10.1 mg/dL (ref 8.7–10.2)
CHLORIDE: 115 mmol/L — AB (ref 96–106)
CO2: 16 mmol/L — AB (ref 20–29)
Creatinine, Ser: 1.15 mg/dL — ABNORMAL HIGH (ref 0.57–1.00)
GFR calc non Af Amer: 52 mL/min/{1.73_m2} — ABNORMAL LOW (ref >59)
GFR, EST AFRICAN AMERICAN: 60 mL/min/{1.73_m2} (ref >59)
Glucose: 92 mg/dL (ref 65–99)
Potassium: 4.4 mmol/L (ref 3.5–5.2)
Sodium: 154 mmol/L — ABNORMAL HIGH (ref 134–144)

## 2017-10-25 NOTE — Telephone Encounter (Signed)
Left message on voicemail in reference to upcoming appointment scheduled for 10/29/17. Phone number given for a call back so details instructions can be given.  Kirstie Peri, RN

## 2017-10-26 ENCOUNTER — Telehealth: Payer: Self-pay

## 2017-10-26 DIAGNOSIS — E860 Dehydration: Secondary | ICD-10-CM

## 2017-10-26 NOTE — Telephone Encounter (Signed)
Notes recorded by Frederik Schmidt, RN on 10/26/2017 at 11:14 AM EDT lpmtcb 8/20 ------

## 2017-10-26 NOTE — Telephone Encounter (Signed)
Notes recorded by Frederik Schmidt, RN on 10/26/2017 at 12:09 PM EDT Spoke with the patient and informed her of results/recommendations. We also set her up for labs 9/4. ------

## 2017-10-26 NOTE — Telephone Encounter (Signed)
-----   Message from Vicki Bush, MD sent at 10/26/2017  6:43 AM EDT ----- BMP with elevated sodium, mildly elevated creatinine and acidosis.  I wonder if the patient is somewhat dehydrated.  I recommend that she increase her water intake and have a repeat BMP in 1-2 weeks.

## 2017-10-26 NOTE — Telephone Encounter (Signed)
-----   Message from Nelva Bush, MD sent at 10/26/2017  6:43 AM EDT ----- BMP with elevated sodium, mildly elevated creatinine and acidosis.  I wonder if the patient is somewhat dehydrated.  I recommend that she increase her water intake and have a repeat BMP in 1-2 weeks.

## 2017-10-29 ENCOUNTER — Other Ambulatory Visit: Payer: Self-pay

## 2017-10-29 ENCOUNTER — Ambulatory Visit (HOSPITAL_COMMUNITY): Payer: BLUE CROSS/BLUE SHIELD | Attending: Cardiology

## 2017-10-29 ENCOUNTER — Ambulatory Visit (HOSPITAL_BASED_OUTPATIENT_CLINIC_OR_DEPARTMENT_OTHER): Payer: BLUE CROSS/BLUE SHIELD

## 2017-10-29 DIAGNOSIS — I081 Rheumatic disorders of both mitral and tricuspid valves: Secondary | ICD-10-CM | POA: Insufficient documentation

## 2017-10-29 DIAGNOSIS — R079 Chest pain, unspecified: Secondary | ICD-10-CM | POA: Diagnosis not present

## 2017-10-29 DIAGNOSIS — I2510 Atherosclerotic heart disease of native coronary artery without angina pectoris: Secondary | ICD-10-CM | POA: Diagnosis not present

## 2017-10-29 DIAGNOSIS — I4891 Unspecified atrial fibrillation: Secondary | ICD-10-CM | POA: Insufficient documentation

## 2017-10-29 DIAGNOSIS — E785 Hyperlipidemia, unspecified: Secondary | ICD-10-CM | POA: Insufficient documentation

## 2017-10-29 DIAGNOSIS — B20 Human immunodeficiency virus [HIV] disease: Secondary | ICD-10-CM | POA: Diagnosis not present

## 2017-10-29 DIAGNOSIS — I10 Essential (primary) hypertension: Secondary | ICD-10-CM | POA: Insufficient documentation

## 2017-10-29 DIAGNOSIS — R002 Palpitations: Secondary | ICD-10-CM | POA: Diagnosis not present

## 2017-10-29 DIAGNOSIS — R0602 Shortness of breath: Secondary | ICD-10-CM | POA: Insufficient documentation

## 2017-10-29 LAB — MYOCARDIAL PERFUSION IMAGING
CSEPPHR: 104 {beats}/min
LV dias vol: 71 mL (ref 46–106)
LVSYSVOL: 20 mL
RATE: 0.25
Rest HR: 61 {beats}/min
SDS: 3
SRS: 4
SSS: 7
TID: 0.65

## 2017-10-29 LAB — ECHOCARDIOGRAM COMPLETE
Height: 66 in
Weight: 2656 oz

## 2017-10-29 MED ORDER — TECHNETIUM TC 99M TETROFOSMIN IV KIT
10.9000 | PACK | Freq: Once | INTRAVENOUS | Status: AC | PRN
  Administered 2017-10-29: 10.9 via INTRAVENOUS
  Filled 2017-10-29: qty 11

## 2017-10-29 MED ORDER — REGADENOSON 0.4 MG/5ML IV SOLN
0.4000 mg | Freq: Once | INTRAVENOUS | Status: AC
  Administered 2017-10-29: 0.4 mg via INTRAVENOUS

## 2017-10-29 MED ORDER — TECHNETIUM TC 99M TETROFOSMIN IV KIT
30.4000 | PACK | Freq: Once | INTRAVENOUS | Status: AC | PRN
  Administered 2017-10-29: 30.4 via INTRAVENOUS
  Filled 2017-10-29: qty 31

## 2017-11-02 ENCOUNTER — Telehealth: Payer: Self-pay | Admitting: Internal Medicine

## 2017-11-02 ENCOUNTER — Telehealth: Payer: Self-pay

## 2017-11-02 NOTE — Telephone Encounter (Signed)
New Message:    Pt returning a call for results

## 2017-11-02 NOTE — Telephone Encounter (Signed)
Notes recorded by Frederik Schmidt, RN on 11/02/2017 at 11:43 AM EDT Informed patient of results/recommendations. She verbalized understanding. ------

## 2017-11-02 NOTE — Telephone Encounter (Signed)
-----   Message from Nelva Bush, MD sent at 11/02/2017  6:29 AM EDT ----- Please let Ms. Nathanson know that her stress test is normal without evidence of a blockage.  She should continue her current medications and follow-up as previously discussed.

## 2017-11-02 NOTE — Telephone Encounter (Signed)
-----   Message from Nelva Bush, MD sent at 11/02/2017  6:28 AM EDT ----- Please let Vicki Brown know that her echo shows that her heart is contracting well.  There is mild leakage of her heart valves, which I do not believe is contributing to her symptoms.  She should continue her current medications and f/u as previously discussed.

## 2017-11-02 NOTE — Telephone Encounter (Signed)
Notes recorded by Frederik Schmidt, RN on 11/02/2017 at 8:09 AM EDT lpmtcb 8/27

## 2017-11-04 ENCOUNTER — Other Ambulatory Visit: Payer: Self-pay | Admitting: Infectious Diseases

## 2017-11-04 DIAGNOSIS — B20 Human immunodeficiency virus [HIV] disease: Secondary | ICD-10-CM

## 2017-11-10 ENCOUNTER — Other Ambulatory Visit: Payer: BLUE CROSS/BLUE SHIELD

## 2017-11-18 ENCOUNTER — Encounter: Payer: Self-pay | Admitting: Physician Assistant

## 2017-11-30 NOTE — Progress Notes (Signed)
Cardiology Office Note    Date:  12/01/2017   ID:  Cresta, Riden 12-25-1960, MRN [id number]  PCP:  Campbell Riches, MD  Cardiologist: Nelva Bush, MD EPS: None  Chief Complaint  Patient presents with  . Follow-up    History of Present Illness:  Vicki Brown is a 57 y.o. female who was seen by Dr. Saunders Revel 10/21/2017 for the evaluation of chest pain.  She has a history of hypertension, HLD, HIV.  Also was in atrial fibrillation, duration unknown and was having intermittent rapid ventricular rates.  Metoprolol 25 mg twice daily and Xarelto 20 mg once daily were added.  With plans for cardioversion in 1 month.  2D echo showed normal LV function with grade 1 DD, nuclear stress test normal with no ischemia LVEF 72%.  TSH was low.  Patient comes in today for follow-up.  She feels like her heart is settled down.  It only goes fast when she is rushing around at work.  Overall she is feeling better.  Her TSH was 0.274.  She is never had thyroid problem in the past.  She had some bright red blood when she wiped but she does have hemorrhoids.  Denies melena.    Past Medical History:  Diagnosis Date  . Allergy   . HIV infection ([place])   . Hyperlipidemia   . Hypertension   . Insomnia 12/19/2014  . Trichomonas infection 12/19/2014  . Yeast infection 12/19/2014    Past Surgical History:  Procedure Laterality Date  . Broken Arm Left    Pins placed in arm  . TUBAL LIGATION      Current Medications: Current Meds  Medication Sig  . atorvastatin (LIPITOR) 10 MG tablet Take 1 tablet (10 mg total) by mouth daily.  [name] BIKTARVY 50-200-25 MG TABS tablet TAKE 1 TABLET BY MOUTH DAILY  . ibuprofen (ADVIL,MOTRIN) 800 MG tablet TAKE 1 TABLET(800 MG) BY MOUTH TWICE DAILY WITH FOOD AS NEEDED  . lisinopril-hydrochlorothiazide (ZESTORETIC) 10-12.5 MG tablet Take 1 tablet by mouth daily.  . metoprolol tartrate (LOPRESSOR) 25 MG tablet Take 1 tablet (25 mg total) by mouth 2 (two) times daily.  .  rivaroxaban (XARELTO) 20 MG TABS tablet Take 1 tablet (20 mg total) by mouth daily with supper.  . traZODone (DESYREL) 50 MG tablet Take 0.5 tablets (25 mg total) by mouth at bedtime.   Current Facility-Administered Medications for the 12/01/17 encounter (Office Visit) with Imogene Burn, PA-C  Medication  . 0.9 %  sodium chloride infusion     Allergies:   Patient has no known allergies.   Social History   Socioeconomic History  . Marital status: Single    Spouse name: Not on file  . Number of children: Not on file  . Years of education: Not on file  . Highest education level: Not on file  Occupational History  . Not on file  Social Needs  . Financial resource strain: Not on file  . Food insecurity:    Worry: Not on file    Inability: Not on file  . Transportation needs:    Medical: Not on file    Non-medical: Not on file  Tobacco Use  . Smoking status: Former Smoker    Packs/day: 2.00    Years: 20.00    Pack years: 40.00    Types: Cigarettes    Last attempt to quit: 2004    Years since quitting: 15.7  . Smokeless tobacco: Never Used  . Tobacco  comment: 20 years ago  Substance and Sexual Activity  . Alcohol use: Yes    Alcohol/week: 6.0 standard drinks    Types: 6 Cans of beer per week  . Drug use: Yes    Frequency: 0.5 times per week    Types: Marijuana  . Sexual activity: Yes    Partners: Male    Birth control/protection: Condom    Comment: accepted condoms  Lifestyle  . Physical activity:    Days per week: Not on file    Minutes per session: Not on file  . Stress: Not on file  Relationships  . Social connections:    Talks on phone: Not on file    Gets together: Not on file    Attends religious service: Not on file    Active member of club or organization: Not on file    Attends meetings of clubs or organizations: Not on file    Relationship status: Not on file  Other Topics Concern  . Not on file  Social History Narrative  . Not on file      Family History:  The patient's family history includes Breast cancer in her maternal aunt; Cancer in her mother; Diabetes in her daughter; Mental illness in her brother; Seizures in her father.   ROS:   Please see the history of present illness.    Review of Systems  Gastrointestinal: Positive for nausea.  Neurological: Positive for headaches.   All other systems reviewed and are negative.   PHYSICAL EXAM:   VS:  BP 122/68   Pulse 72   Ht 5\' 6"  (1.676 m)   Wt 166 lb (75.3 kg)   BMI 26.79 kg/m   Physical Exam  GEN: Well nourished, well developed, in no acute distress  Neck: no JVD, carotid bruits, or masses Cardiac:RRR; no murmurs, rubs, or gallops  Respiratory:  clear to auscultation bilaterally, normal work of breathing GI: soft, nontender, nondistended, + BS Ext: without cyanosis, clubbing, or edema, Good distal pulses bilaterally Neuro:  Alert and Oriented x 3 Psych: euthymic mood, full affect  Wt Readings from Last 3 Encounters:  12/01/17 166 lb (75.3 kg)  10/29/17 166 lb (75.3 kg)  10/21/17 165 lb 12.8 oz (75.2 kg)      Studies/Labs Reviewed:   EKG:  EKG is  ordered today.  The ekg ordered today demonstrates normal sinus rhythm  Recent Labs: 07/14/2017: ALT 17 10/21/2017: BUN 14; Creatinine, Ser 1.15; Hemoglobin 13.2; Magnesium 2.1; Platelets 240; Potassium 4.4; Sodium 154; TSH 0.274   Lipid Panel    Component Value Date/Time   CHOL 225 (H) 07/14/2017 0938   TRIG 59 07/14/2017 0938   HDL 88 07/14/2017 0938   CHOLHDL 2.6 07/14/2017 0938   VLDL 13 04/22/2016 1344   LDLCALC 122 (H) 07/14/2017 0938    Additional studies/ records that were reviewed today include:   2D echo 8/23/2019Study Conclusions   - Left ventricle: The cavity size was normal. Systolic function was   normal. The estimated ejection fraction was in the range of 55%   to 60%. Wall motion was normal; there were no regional wall   motion abnormalities. Doppler parameters are consistent  with   abnormal left ventricular relaxation (grade 1 diastolic   dysfunction). There was no evidence of elevated ventricular   filling pressure by Doppler parameters. - Aortic valve: There was trivial regurgitation. - Mitral valve: There was mild regurgitation. - Left atrium: The atrium was normal in size. - Right ventricle: The cavity  size was normal. Wall thickness was   normal. Systolic function was normal. - Tricuspid valve: There was mild regurgitation. - Pulmonary arteries: Systolic pressure was within the normal   range. - Inferior vena cava: The vessel was normal in size. - Pericardium, extracardiac: There was no pericardial effusion.   Nuclear stress test 8/23/2019Study Highlights      Nuclear stress EF: 72%.  The left ventricular ejection fraction is hyperdynamic (>65%).  There was no ST segment deviation noted during stress.  The study is normal.  This is a low risk study.   Normal resting and stress perfusion. No ischemia or infarction EF 72%      ASSESSMENT:    1. Persistent atrial fibrillation ([place])   2. Essential hypertension   3. Other chest pain   4. Dyslipidemia      PLAN:  In order of problems listed above:  Atrial fibrillation question duration chads vas score equals 2 on metoprolol and Xarelto since 10/21/2017-patient is in normal sinus rhythm today.  Still has fast heart rates when she is rushing around at work.  Will increase metoprolol to 37.5 mg twice daily.  Follow-up with myself in 4 to 6 weeks.  TSH was low which could be contributing to this.  Recommend she see Dr. Johnnye Sima back for this and full work-up.  Chest pain most likely secondary to atrial fibrillation nuclear stress test normal without ischemia 10/29/2017 normal LVEF on 2D echo no further chest pain.  Essential hypertension on lisinopril/HCTZ and metoprolol blood pressure controlled.  May need to decrease lisinopril HCTZ with increase metoprolol if blood pressure comes down too  low.  Dyslipidemia on atorvastatin 10 mg daily    Medication Adjustments/Labs and Tests Ordered: Current medicines are reviewed at length with the patient today.  Concerns regarding medicines are outlined above.  Medication changes, Labs and Tests ordered today are listed in the Patient Instructions below. Patient Instructions  Medication Instructions:  Your physician has recommended you make the following change in your medication: 1.  INCREASE the Metoprolol to 25 mg taking 1 1/2 tablet twice a day   Labwork: None ordered  Testing/Procedures: None ordered  Follow-Up: Your physician recommends that you schedule a follow-up appointment in: 01/12/18 ARRIVE AT 10:15 TO SEE Janai Brannigan, PA-C  Any Other Special Instructions Will Be Listed Below (If Applicable).     If you need a refill on your cardiac medications before your next appointment, please call your pharmacy.      Sumner Boast, PA-C  12/01/2017 10:24 AM    [address], [place], [place]  [address] Phone: [phone]; Fax: [phone]

## 2017-12-01 ENCOUNTER — Encounter: Payer: Self-pay | Admitting: Physician Assistant

## 2017-12-01 ENCOUNTER — Telehealth: Payer: Self-pay | Admitting: *Deleted

## 2017-12-01 ENCOUNTER — Ambulatory Visit (INDEPENDENT_AMBULATORY_CARE_PROVIDER_SITE_OTHER): Payer: BLUE CROSS/BLUE SHIELD | Admitting: Physician Assistant

## 2017-12-01 VITALS — BP 122/68 | HR 72 | Ht 66.0 in | Wt 166.0 lb

## 2017-12-01 DIAGNOSIS — R0789 Other chest pain: Secondary | ICD-10-CM

## 2017-12-01 DIAGNOSIS — I481 Persistent atrial fibrillation: Secondary | ICD-10-CM | POA: Diagnosis not present

## 2017-12-01 DIAGNOSIS — E785 Hyperlipidemia, unspecified: Secondary | ICD-10-CM | POA: Diagnosis not present

## 2017-12-01 DIAGNOSIS — I10 Essential (primary) hypertension: Secondary | ICD-10-CM | POA: Diagnosis not present

## 2017-12-01 DIAGNOSIS — I4819 Other persistent atrial fibrillation: Secondary | ICD-10-CM

## 2017-12-01 MED ORDER — METOPROLOL TARTRATE 25 MG PO TABS: 37.5000 mg | ORAL_TABLET | Freq: Two times a day (BID) | ORAL | 3 refills | Status: DC

## 2017-12-01 NOTE — Patient Instructions (Addendum)
Medication Instructions:  Your physician has recommended you make the following change in your medication: 1.  INCREASE the Metoprolol to 25 mg taking 1 1/2 tablet twice a day   Labwork: None ordered  Testing/Procedures: None ordered  Follow-Up: Your physician recommends that you schedule a follow-up appointment in: 01/12/18 ARRIVE AT 10:15 TO SEE MICHELE LENZE, PA-C   Any Other Special Instructions Will Be Listed Below (If Applicable).     If you need a refill on your cardiac medications before your next appointment, please call your pharmacy.

## 2017-12-01 NOTE — Telephone Encounter (Signed)
If this is an urgent thyroid issue, she needs to be seen by endocrinology asap i'd be glad to see her on my next clinic (which I think is in 2 weeks and probably too long for her to wait) thanks

## 2017-12-01 NOTE — Telephone Encounter (Signed)
Nurse at [facility] care called to report the patients Thyroid level is low and she needs to follow up here urgently as Vicki Brown is her PCP. Advised he has no appointments until 01/07/18 and I will have to send him a note to see when he wants to see her. Advised her to tell the patient we will call her directly with a follow up appointment. Advised they will send Vicki Brown the note for review as well.

## 2017-12-07 ENCOUNTER — Other Ambulatory Visit: Payer: Self-pay | Admitting: Infectious Diseases

## 2017-12-07 DIAGNOSIS — E039 Hypothyroidism, unspecified: Secondary | ICD-10-CM

## 2017-12-07 NOTE — Telephone Encounter (Signed)
Thanks

## 2018-01-06 ENCOUNTER — Telehealth: Payer: Self-pay

## 2018-01-06 NOTE — Telephone Encounter (Signed)
Called patient to inform her that Dr. Johnnye Sima has accepted a new position and would be having some changes in his schedule. Informed patient that our office is reassigning patients that were under Dr. Algis Down[facility] care to other providers in our office. Patient is understanding and agreed to see a different provider. Patient states she would like an appointment sometime soon since she has not be in our clinic. Patient will come in on 01/13/18 to [facility] [place]

## 2018-01-12 ENCOUNTER — Ambulatory Visit: Payer: BLUE CROSS/BLUE SHIELD | Admitting: Physician Assistant

## 2018-01-13 ENCOUNTER — Ambulatory Visit: Payer: BLUE CROSS/BLUE SHIELD | Admitting: Family

## 2018-01-27 ENCOUNTER — Encounter: Payer: Self-pay | Admitting: Endocrinology

## 2018-01-27 ENCOUNTER — Ambulatory Visit (INDEPENDENT_AMBULATORY_CARE_PROVIDER_SITE_OTHER): Payer: BLUE CROSS/BLUE SHIELD | Admitting: Endocrinology

## 2018-01-27 DIAGNOSIS — Z23 Encounter for immunization: Secondary | ICD-10-CM | POA: Diagnosis not present

## 2018-01-27 DIAGNOSIS — R490 Dysphonia: Secondary | ICD-10-CM | POA: Diagnosis not present

## 2018-01-27 DIAGNOSIS — E0590 Thyrotoxicosis, unspecified without thyrotoxic crisis or storm: Secondary | ICD-10-CM | POA: Diagnosis not present

## 2018-01-27 NOTE — Progress Notes (Signed)
Subjective:    Patient ID: Vicki Brown, female    DOB: 1961-01-15, 57 y.o.   MRN: [id number]  HPI Pt is referred by Dr Johnnye Sima, for hyperthyroidism.  Pt was dx'ed with hyperthyroidism in [place].  she has never been on therapy for this.  she has never had XRT to the anterior neck, or thyroid surgery.  she has never had thyroid imaging.  she does not consume kelp or any other prescribed or non-prescribed thyroid medication.  she has never been on amiodarone.  She reports slight palpitations in the chest, and assoc hoarseness.  Past Medical History:  Diagnosis Date  . Allergy   . HIV infection ([place])   . Hyperlipidemia   . Hypertension   . Insomnia 12/19/2014  . Trichomonas infection 12/19/2014  . Yeast infection 12/19/2014    Past Surgical History:  Procedure Laterality Date  . Broken Arm Left    Pins placed in arm  . TUBAL LIGATION      Social History   Socioeconomic History  . Marital status: Single    Spouse name: Not on file  . Number of children: Not on file  . Years of education: Not on file  . Highest education level: Not on file  Occupational History  . Not on file  Social Needs  . Financial resource strain: Not on file  . Food insecurity:    Worry: Not on file    Inability: Not on file  . Transportation needs:    Medical: Not on file    Non-medical: Not on file  Tobacco Use  . Smoking status: Former Smoker    Packs/day: 2.00    Years: 20.00    Pack years: 40.00    Types: Cigarettes    Last attempt to quit: 2004    Years since quitting: 15.9  . Smokeless tobacco: Never Used  . Tobacco comment: 20 years ago  Substance and Sexual Activity  . Alcohol use: Yes    Alcohol/week: 6.0 standard drinks    Types: 6 Cans of beer per week  . Drug use: Yes    Frequency: 0.5 times per week    Types: Marijuana  . Sexual activity: Yes    Partners: Male    Birth control/protection: Condom    Comment: accepted condoms  Lifestyle  . Physical activity:    Days  per week: Not on file    Minutes per session: Not on file  . Stress: Not on file  Relationships  . Social connections:    Talks on phone: Not on file    Gets together: Not on file    Attends religious service: Not on file    Active member of club or organization: Not on file    Attends meetings of clubs or organizations: Not on file    Relationship status: Not on file  . Intimate partner violence:    Fear of current or ex partner: Not on file    Emotionally abused: Not on file    Physically abused: Not on file    Forced sexual activity: Not on file  Other Topics Concern  . Not on file  Social History Narrative  . Not on file    Current Outpatient Medications on File Prior to Visit  Medication Sig Dispense Refill  . atorvastatin (LIPITOR) 10 MG tablet Take 1 tablet (10 mg total) by mouth daily. 30 tablet 5  . BIKTARVY 50-200-25 MG TABS tablet TAKE 1 TABLET BY MOUTH DAILY 30 tablet 2  .  ibuprofen (ADVIL,MOTRIN) 800 MG tablet TAKE 1 TABLET(800 MG) BY MOUTH TWICE DAILY WITH FOOD AS NEEDED 30 tablet 0  . lisinopril-hydrochlorothiazide (ZESTORETIC) 10-12.5 MG tablet Take 1 tablet by mouth daily. 90 tablet 3  . metoprolol tartrate (LOPRESSOR) 25 MG tablet Take 1.5 tablets (37.5 mg total) by mouth 2 (two) times daily. 270 tablet 3  . rivaroxaban (XARELTO) 20 MG TABS tablet Take 1 tablet (20 mg total) by mouth daily with supper. 30 tablet 3  . traZODone (DESYREL) 50 MG tablet Take 0.5 tablets (25 mg total) by mouth at bedtime. 30 tablet 3   Current Facility-Administered Medications on File Prior to Visit  Medication Dose Route Frequency Provider Last Rate Last Dose  . 0.9 %  sodium chloride infusion  500 mL Intravenous Continuous Danis, Estill Cotta III, MD        No Known Allergies  Family History  Problem Relation Age of Onset  . Cancer Mother        uterine vs ovarian?  . Seizures Father   . Mental illness Brother   . Breast cancer Maternal Aunt   . Diabetes Daughter   . Colon  polyps Neg Hx   . Esophageal cancer Neg Hx   . Rectal cancer Neg Hx   . Stomach cancer Neg Hx   . Thyroid disease Neg Hx     BP 116/68 (BP Location: Right Arm, Patient Position: Sitting, Cuff Size: Normal)   Pulse 82   Ht 5\' 6"  (1.676 m)   Wt 167 lb 6.4 oz (75.9 kg)   SpO2 98%   BMI 27.02 kg/m     Review of Systems denies weight loss, headache, diplopia, sob, diarrhea, polyuria, muscle weakness, edema, excessive diaphoresis, tremor, skin rash, anxiety, heat intolerance, easy bruising, and rhinorrhea.      Objective:   Physical Exam VS: see vs page GEN: no distress HEAD: head: no deformity eyes: no periorbital swelling, no proptosis external nose and ears are normal mouth: no lesion seen NECK: 2 cm right thyroid nodule is noted.  CHEST WALL: no deformity LUNGS: clear to auscultation CV: reg rate and rhythm, no murmur ABD: abdomen is soft, nontender.  no hepatosplenomegaly.  not distended.  no hernia MUSCULOSKELETAL: muscle bulk and strength are grossly normal.  no obvious joint swelling.  gait is normal and steady.   EXTEMITIES: no deformity.  no leg edema PULSES: no carotid bruit NEURO:  cn 2-12 grossly intact.   readily moves all 4's.  sensation is intact to touch on all 4's.  No tremor SKIN:  Normal texture and temperature.  No rash or suspicious lesion is visible.   NODES:  None palpable at the neck PSYCH: alert, well-oriented.  Does not appear anxious nor depressed.   Lab Results  Component Value Date   TSH 0.274 (L) 10/21/2017   I have reviewed outside records, and summarized: Pt was noted to have suppressed TSH, and referred here.  She was recently seen by cardiol for AF      Assessment & Plan:  Thyroid nodule, new Hyperthyroidism, new.  prob due to nodular goiter.  We discussed: she declines RAI.   AF: in this context, she needs rx of even mildly abnormal TSH.  Hoarseness: uncertain etiology.  Very unlikely thyroid-related.   Patient Instructions    blood tests are requested for you today.  We'll let you know about the results. If it is overactive again, i'll prescribe for a medication to slow it back to normal. Let's check the  ultrasound.  you will receive a phone call, about a day and time for an appointment. Please see an ear-nose-throat specialist.  you will receive a phone call, about a day and time for an appointment. Please come back for a follow-up appointment in 2 months.

## 2018-01-27 NOTE — Patient Instructions (Signed)
blood tests are requested for you today.  We'll let you know about the results. If it is overactive again, i'll prescribe for a medication to slow it back to normal. Let's check the ultrasound.  you will receive a phone call, about a day and time for an appointment. Please see an ear-nose-throat specialist.  you will receive a phone call, about a day and time for an appointment. Please come back for a follow-up appointment in 2 months.

## 2018-02-01 ENCOUNTER — Other Ambulatory Visit: Payer: Self-pay | Admitting: Infectious Diseases

## 2018-02-01 DIAGNOSIS — Z1231 Encounter for screening mammogram for malignant neoplasm of breast: Secondary | ICD-10-CM

## 2018-02-10 ENCOUNTER — Ambulatory Visit
Admission: RE | Admit: 2018-02-10 | Discharge: 2018-02-10 | Disposition: A | Discharge status: Home / Self Care | Payer: BLUE CROSS/BLUE SHIELD | Source: Ambulatory Visit | Attending: Endocrinology | Admitting: Endocrinology

## 2018-02-10 DIAGNOSIS — E0590 Thyrotoxicosis, unspecified without thyrotoxic crisis or storm: Secondary | ICD-10-CM

## 2018-02-10 DIAGNOSIS — E041 Nontoxic single thyroid nodule: Secondary | ICD-10-CM | POA: Diagnosis not present

## 2018-02-24 DIAGNOSIS — J3089 Other allergic rhinitis: Secondary | ICD-10-CM | POA: Diagnosis not present

## 2018-02-24 DIAGNOSIS — K219 Gastro-esophageal reflux disease without esophagitis: Secondary | ICD-10-CM | POA: Diagnosis not present

## 2018-02-24 DIAGNOSIS — R490 Dysphonia: Secondary | ICD-10-CM | POA: Diagnosis not present

## 2018-03-17 ENCOUNTER — Ambulatory Visit
Admission: RE | Admit: 2018-03-17 | Discharge: 2018-03-17 | Disposition: A | Discharge status: Home / Self Care | Payer: BLUE CROSS/BLUE SHIELD | Source: Ambulatory Visit | Attending: Infectious Diseases | Admitting: Infectious Diseases

## 2018-03-17 DIAGNOSIS — Z1231 Encounter for screening mammogram for malignant neoplasm of breast: Secondary | ICD-10-CM | POA: Diagnosis not present

## 2018-03-26 ENCOUNTER — Other Ambulatory Visit: Payer: Self-pay | Admitting: Family

## 2018-03-28 ENCOUNTER — Other Ambulatory Visit: Payer: Self-pay | Admitting: Behavioral Health

## 2018-03-28 DIAGNOSIS — I10 Essential (primary) hypertension: Secondary | ICD-10-CM

## 2018-03-28 MED ORDER — LISINOPRIL-HYDROCHLOROTHIAZIDE 10-12.5 MG PO TABS: 1.0000 | ORAL_TABLET | Freq: Every day | ORAL | 0 refills | Status: DC

## 2018-03-31 ENCOUNTER — Other Ambulatory Visit: Payer: BLUE CROSS/BLUE SHIELD

## 2018-03-31 ENCOUNTER — Ambulatory Visit: Payer: BLUE CROSS/BLUE SHIELD | Admitting: Endocrinology

## 2018-03-31 DIAGNOSIS — Z113 Encounter for screening for infections with a predominantly sexual mode of transmission: Secondary | ICD-10-CM | POA: Diagnosis not present

## 2018-03-31 DIAGNOSIS — B20 Human immunodeficiency virus [HIV] disease: Secondary | ICD-10-CM | POA: Diagnosis not present

## 2018-03-31 DIAGNOSIS — E785 Hyperlipidemia, unspecified: Secondary | ICD-10-CM

## 2018-04-01 LAB — URINE CYTOLOGY ANCILLARY ONLY
CHLAMYDIA, DNA PROBE: NEGATIVE
Neisseria Gonorrhea: NEGATIVE

## 2018-04-01 LAB — T-HELPER CELL (CD4) - (RCID CLINIC ONLY)
CD4 % Helper T Cell: 59 % — ABNORMAL HIGH (ref 33–55)
CD4 T Cell Abs: 780 /uL (ref 400–2700)

## 2018-04-02 LAB — CBC
HCT: 35.5 % (ref 35.0–45.0)
Hemoglobin: 12.1 g/dL (ref 11.7–15.5)
MCH: 32.9 pg (ref 27.0–33.0)
MCHC: 34.1 g/dL (ref 32.0–36.0)
MCV: 96.5 fL (ref 80.0–100.0)
MPV: 10 fL (ref 7.5–12.5)
Platelets: 238 10*3/uL (ref 140–400)
RBC: 3.68 10*6/uL — ABNORMAL LOW (ref 3.80–5.10)
RDW: 12.5 % (ref 11.0–15.0)
WBC: 2.8 10*3/uL — ABNORMAL LOW (ref 3.8–10.8)

## 2018-04-02 LAB — COMPREHENSIVE METABOLIC PANEL
AG Ratio: 1.2 (calc) (ref 1.0–2.5)
ALT: 41 U/L — ABNORMAL HIGH (ref 6–29)
AST: 43 U/L — ABNORMAL HIGH (ref 10–35)
Albumin: 4 g/dL (ref 3.6–5.1)
Alkaline phosphatase (APISO): 63 U/L (ref 33–130)
BUN: 13 mg/dL (ref 7–25)
CALCIUM: 9.4 mg/dL (ref 8.6–10.4)
CO2: 27 mmol/L (ref 20–32)
Chloride: 105 mmol/L (ref 98–110)
Creat: 1.03 mg/dL (ref 0.50–1.05)
Globulin: 3.4 g/dL (calc) (ref 1.9–3.7)
Glucose, Bld: 83 mg/dL (ref 65–99)
Potassium: 4 mmol/L (ref 3.5–5.3)
Sodium: 140 mmol/L (ref 135–146)
Total Bilirubin: 0.4 mg/dL (ref 0.2–1.2)
Total Protein: 7.4 g/dL (ref 6.1–8.1)

## 2018-04-02 LAB — HIV-1 RNA QUANT-NO REFLEX-BLD
HIV 1 RNA Quant: 30 copies/mL — ABNORMAL HIGH
HIV-1 RNA Quant, Log: 1.48 Log copies/mL — ABNORMAL HIGH

## 2018-04-02 LAB — LIPID PANEL
Cholesterol: 221 mg/dL — ABNORMAL HIGH (ref <200)
HDL: 89 mg/dL (ref >50)
LDL Cholesterol (Calc): 118 mg/dL (calc) — ABNORMAL HIGH
Non-HDL Cholesterol (Calc): 132 mg/dL (calc) — ABNORMAL HIGH (ref <130)
Total CHOL/HDL Ratio: 2.5 (calc) (ref <5.0)
Triglycerides: 52 mg/dL (ref <150)

## 2018-04-02 LAB — RPR: RPR Ser Ql: NONREACTIVE

## 2018-04-22 ENCOUNTER — Encounter: Payer: Self-pay | Admitting: Infectious Diseases

## 2018-04-22 ENCOUNTER — Ambulatory Visit (INDEPENDENT_AMBULATORY_CARE_PROVIDER_SITE_OTHER): Payer: BLUE CROSS/BLUE SHIELD | Admitting: Infectious Diseases

## 2018-04-22 VITALS — BP 133/87 | HR 79 | Temp 98.2°F | Ht 66.0 in | Wt 170.2 lb

## 2018-04-22 DIAGNOSIS — R945 Abnormal results of liver function studies: Secondary | ICD-10-CM

## 2018-04-22 DIAGNOSIS — B20 Human immunodeficiency virus [HIV] disease: Secondary | ICD-10-CM | POA: Diagnosis not present

## 2018-04-22 DIAGNOSIS — E785 Hyperlipidemia, unspecified: Secondary | ICD-10-CM

## 2018-04-22 DIAGNOSIS — I480 Paroxysmal atrial fibrillation: Secondary | ICD-10-CM | POA: Diagnosis not present

## 2018-04-22 DIAGNOSIS — R7989 Other specified abnormal findings of blood chemistry: Secondary | ICD-10-CM

## 2018-04-22 DIAGNOSIS — I10 Essential (primary) hypertension: Secondary | ICD-10-CM

## 2018-04-22 MED ORDER — BICTEGRAVIR-EMTRICITAB-TENOFOV 50-200-25 MG PO TABS: 1.0000 | ORAL_TABLET | Freq: Every day | ORAL | 11 refills | Status: DC

## 2018-04-22 NOTE — Assessment & Plan Note (Signed)
Controlled on current regimen. Will refer to PCP for assistance now that she has insurance.

## 2018-04-22 NOTE — Assessment & Plan Note (Signed)
I suspect slight elevation related to diet and frequent/daily alcohol consumption. Cholesterol appears under decent control off statin.  Discussed dietary changes to improve this as well as reducing her alcohol intake by 50% if she can. She is hepatitis a/b immune. No risk factors for Hep C and was negative in 2012.

## 2018-04-22 NOTE — Assessment & Plan Note (Signed)
She has stopped her cholesterol medication. Triglycerides in good range.  It is not known if she is diabetic - likely needs A1C screen to ensure but all CBGs have normal glucose readings. She is not a smoker.  I think dietary/lifestyle changes would be enough for her at this time as her CVD 10-yr risk is low outside of her HIV disease. We discussed focusing on reducing her alcohol intake and carbohydrate intake to start.

## 2018-04-22 NOTE — Assessment & Plan Note (Addendum)
She last saw Cardiology in September where her Metoprolol was increased but was in SR at that point. She was due back for an appointment. Will send a note to arrange follow up as I am not clear on her Xarelto plans.

## 2018-04-22 NOTE — Progress Notes (Signed)
Name: Vicki Brown  DOB: [birth year] MRN: [id number] PCP: No primary care provider on file.    Patient Active Problem List   Diagnosis Date Noted  . Elevated liver function tests 04/22/2018  . Hyperthyroidism 01/27/2018  . Hoarseness 01/27/2018  . Shortness of breath 10/21/2017  . Atrial fibrillation ([place]) 10/21/2017  . Hyperlipidemia 10/21/2017  . Right wrist pain 07/03/2015  . Insomnia 12/19/2014  . Chest pain 11/07/2014  . Allergic rhinitis 12/28/2012  . Dyslipidemia 07/31/2010  . Human immunodeficiency virus (HIV) disease ([place]) 01/19/2008  . PERIPHERAL NEUROPATHY 01/19/2008  . Essential hypertension 01/19/2008     Brief Narrative:  SHAWNTE Brown is a 58 y.o. female with HIV infection, Dx March 2009. History of OIs: none   Previous Regimens: . Atripla . Biktarvy  Genotypes: . No RT/PI mutations in past   Subjective:  CC:  HIV follow up care. No concerns today.   HPI: Lorriane Brown has been doing very well since her last OV with Dr. Johnnye Sima in May 2019. She has continued her biktarvy once a day and reports no trouble with side effects and has full acces to her medications. She is taking two blood pressure pills and she thinks a cholesterol pill too. Not certain about the xarelto, however. She has had no trouble with chest pain or pressure as she was having in the past. She has had trouble sleeping - takes a 1/2 trazodone which helps her a lot.   She is in need of dental care and would like referral to [facility] clinic.  She does not smoke, drinks about 1/2 - 1 40 oz of beer daily. No drug use. Pap smear last normal in 2018 and no hx of abnormal pap smears. Mammogram and colonoscopy up to date as well. She does not have a PCP.   Review of Systems  Constitutional: Negative for chills, fever, malaise/fatigue and weight loss.  HENT: Negative for sore throat.        No dental problems  Respiratory: Negative for cough and sputum production.   Cardiovascular: Negative for chest  pain and leg swelling.  Gastrointestinal: Negative for abdominal pain, diarrhea and vomiting.  Genitourinary: Negative for dysuria and flank pain.  Musculoskeletal: Negative for joint pain, myalgias and neck pain.  Skin: Negative for rash.  Neurological: Negative for dizziness, tingling and headaches.  Psychiatric/Behavioral: Negative for depression and substance abuse. The patient is not nervous/anxious and does not have insomnia.     Past Medical History:  Diagnosis Date  . Allergy   . HIV infection ([place])   . Hyperlipidemia   . Hypertension   . Insomnia 12/19/2014  . Trichomonas infection 12/19/2014  . Yeast infection 12/19/2014    Outpatient Medications Prior to Visit  Medication Sig Dispense Refill  . ibuprofen (ADVIL,MOTRIN) 800 MG tablet TAKE 1 TABLET(800 MG) BY MOUTH TWICE DAILY WITH FOOD AS NEEDED 30 tablet 0  . lisinopril-hydrochlorothiazide (ZESTORETIC) 10-12.5 MG tablet Take 1 tablet by mouth daily. 90 tablet 0  . traZODone (DESYREL) 50 MG tablet Take 0.5 tablets (25 mg total) by mouth at bedtime. 30 tablet 3  . BIKTARVY 50-200-25 MG TABS tablet TAKE 1 TABLET BY MOUTH DAILY 30 tablet 0  . atorvastatin (LIPITOR) 10 MG tablet Take 1 tablet (10 mg total) by mouth daily. 30 tablet 5  . metoprolol tartrate (LOPRESSOR) 25 MG tablet Take 1.5 tablets (37.5 mg total) by mouth 2 (two) times daily. 270 tablet 3  . rivaroxaban (XARELTO) 20 MG TABS tablet Take 1  tablet (20 mg total) by mouth daily with supper. (Patient not taking: Reported on 04/22/2018) 30 tablet 3   Facility-Administered Medications Prior to Visit  Medication Dose Route Frequency Provider Last Rate Last Dose  . 0.9 %  sodium chloride infusion  500 mL Intravenous Continuous Danis, Kirke Corin, MD         No Known Allergies  Social History   Tobacco Use  . Smoking status: Former Smoker    Packs/day: 2.00    Years: 20.00    Pack years: 40.00    Types: Cigarettes    Last attempt to quit: 2004    Years since  quitting: 16.1  . Smokeless tobacco: Never Used  . Tobacco comment: 20 years ago  Substance Use Topics  . Alcohol use: Yes    Alcohol/week: 6.0 standard drinks    Types: 6 Cans of beer per week  . Drug use: Yes    Frequency: 0.5 times per week    Types: Marijuana    Family History  Problem Relation Age of Onset  . Cancer Mother        uterine vs ovarian?  . Seizures Father   . Mental illness Brother   . Breast cancer Maternal Aunt   . Diabetes Daughter   . Colon polyps Neg Hx   . Esophageal cancer Neg Hx   . Rectal cancer Neg Hx   . Stomach cancer Neg Hx   . Thyroid disease Neg Hx     Social History   Substance and Sexual Activity  Sexual Activity Yes  . Partners: Male  . Birth control/protection: Condom   Comment: accepted condoms     Objective:   Vitals:   04/22/18 1121  BP: 133/87  Pulse: 79  Temp: 98.2 F (36.8 C)  TempSrc: Oral  Weight: 170 lb 4 oz (77.2 kg)  Height: 5\' 6"  (1.676 m)   Body mass index is 27.48 kg/m.  Physical Exam Vitals signs reviewed.  Constitutional:      Appearance: She is well-developed.     Comments: Seated comfortably in chair.   HENT:     Mouth/Throat:     Mouth: No oral lesions.     Dentition: Normal dentition. No dental abscesses.     Pharynx: No oropharyngeal exudate.  Cardiovascular:     Rate and Rhythm: Normal rate and regular rhythm.     Heart sounds: Normal heart sounds.  Pulmonary:     Effort: Pulmonary effort is normal.     Breath sounds: Normal breath sounds.  Abdominal:     General: There is no distension.     Palpations: Abdomen is soft.     Tenderness: There is no abdominal tenderness.  Lymphadenopathy:     Cervical: No cervical adenopathy.  Skin:    General: Skin is warm and dry.     Findings: No rash.  Neurological:     Mental Status: She is alert and oriented to person, place, and time.  Psychiatric:        Judgment: Judgment normal.     Comments: In good spirits today and engaged in care  discussion     Lab Results Lab Results  Component Value Date   WBC 2.8 (L) 03/31/2018   HGB 12.1 03/31/2018   HCT 35.5 03/31/2018   MCV 96.5 03/31/2018   PLT 238 03/31/2018    Lab Results  Component Value Date   CREATININE 1.03 03/31/2018   BUN 13 03/31/2018   NA 140 03/31/2018  K 4.0 03/31/2018   CL 105 03/31/2018   CO2 27 03/31/2018    Lab Results  Component Value Date   ALT 41 (H) 03/31/2018   AST 43 (H) 03/31/2018   ALKPHOS 157 (H) 04/22/2016   BILITOT 0.4 03/31/2018    Lab Results  Component Value Date   CHOL 221 (H) 03/31/2018   HDL 89 03/31/2018   LDLCALC 118 (H) 03/31/2018   TRIG 52 03/31/2018   CHOLHDL 2.5 03/31/2018   HIV 1 RNA Quant (copies/mL)  Date Value  03/31/2018 30 (H)  07/14/2017 <20 DETECTED (A)  05/12/2017 <20 NOT DETECTED   CD4 T Cell Abs (/uL)  Date Value  03/31/2018 780  07/14/2017 860  04/22/2016 830     Assessment & Plan:   Problem List Items Addressed This Visit      Unprioritized   Atrial fibrillation [facility])    She last saw Cardiology in September where her Metoprolol was increased but was in SR at that point. She was due back for an appointment. Will send a note to arrange follow up as I am not clear on her Xarelto plans.       Dyslipidemia    She has stopped her cholesterol medication. Triglycerides in good range.  It is not known if she is diabetic - likely needs A1C screen to ensure but all CBGs have normal glucose readings. She is not a smoker.  I think dietary/lifestyle changes would be enough for her at this time as her CVD 10-yr risk is low outside of her HIV disease. We discussed focusing on reducing her alcohol intake and carbohydrate intake to start.       Elevated liver function tests    I suspect slight elevation related to diet and frequent/daily alcohol consumption. Cholesterol appears under decent control off statin.  Discussed dietary changes to improve this as well as reducing her alcohol intake by 50% if  she can. She is hepatitis a/b immune. No risk factors for Hep C and was negative in 2012.        Essential hypertension    Controlled on current regimen. Will refer to PCP for assistance now that she has insurance.       Human immunodeficiency virus (HIV) disease ([place]) - Primary (Chronic)    She is very well controlled on her Biktarvy. Had slight viral blip of 30 copies however I reassurred her that her medications are working perfectly for her and I am very pleased.  Will get her in to see a PCP for regular care of her health maintenance/htn/dyslipidemia and other conditions. She can return in 1 year.       Relevant Medications   bictegravir-emtricitabine-tenofovir AF (BIKTARVY) 50-200-25 MG TABS tablet     We will help her get established with internal medicine/family medicine team now that she has access to insurance. She is in agreement with this plan.   Janene Madeira, MSN, NP-C [facility] for Infectious [place] Pager: [phone] Office: [phone]  04/22/18  12:19 PM

## 2018-04-22 NOTE — Assessment & Plan Note (Signed)
She is very well controlled on her Biktarvy. Had slight viral blip of 30 copies however I reassurred her that her medications are working perfectly for her and I am very pleased.  Will get her in to see a PCP for regular care of her health maintenance/htn/dyslipidemia and other conditions. She can return in 1 year.

## 2018-04-22 NOTE — Patient Instructions (Signed)
Please continue your Biktarvy every day as you are currently doing. This is working very well for you.   I want to get you in to see a primary care provider to help take care of your blood pressure and other health conditions to make sure you have all the latest and greatest of what you need to stay healthy.   Please come back to see me in 1 year.   You are due for a pap smear in 2021 and mammogram in 2022.

## 2018-04-25 ENCOUNTER — Telehealth: Payer: Self-pay

## 2018-04-25 NOTE — Telephone Encounter (Signed)
lpmtcb 2/17

## 2018-04-25 NOTE — Telephone Encounter (Signed)
-----   Message from Nelva Bush, MD sent at 04/23/2018  2:58 PM EST ----- Marcelline Mates,  I hope that you are doing well.  Could you help arrange for Ms. Alsip to see an APP at her convenience?  It looks like she cancelled her appointment in November and did not reschedule.  Let me know if any issues come up.  Thanks.  Gerald Stabs ----- Message ----- From: [place] Callas, NP Sent: 04/22/2018  12:20 PM EST To: Nelva Bush, MD  Good afternoon Dr. Saunders Revel.   I was hopeful your staff can reach out to [facility] to have her come for follow up appointment to address her Xarelto. I see she has not had follow up with your team since September but was due in October after Metoprolol increase. Her BP / HR seem to be under good control and in NSR today on my exam.   Thank you kindly,  Janene Madeira, NP  [place] for Infectious Disease

## 2018-04-26 ENCOUNTER — Telehealth: Payer: Self-pay

## 2018-04-26 NOTE — Telephone Encounter (Signed)
-----   Message from Nelva Bush, MD sent at 04/23/2018  2:58 PM EST ----- Vicki Brown,  I hope that you are doing well.  Could you help arrange for Vicki Brown to see an APP at her convenience?  It looks like she cancelled her appointment in November and did not reschedule.  Let me know if any issues come up.  Thanks.  Vicki Brown ----- Message ----- From:  Callas, NP Sent: 04/22/2018  12:20 PM EST To: Nelva Bush, MD  Good afternoon Vicki Brown.   I was hopeful your staff can reach out to Vicki Brown to have her come for follow up appointment to address her Xarelto. I see she has not had follow up with your team since September but was due in October after Metoprolol increase. Her BP / HR seem to be under good control and in NSR today on my exam.   Thank you kindly,  Vicki Madeira, NP  [place] for Infectious Disease

## 2018-04-26 NOTE — Telephone Encounter (Signed)
Spoke to patient and arranged f/u per Dr End with [facility] on 2/27 @ 11:30am.  She verbalized understanding.

## 2018-05-04 ENCOUNTER — Telehealth: Payer: Self-pay

## 2018-05-04 NOTE — Telephone Encounter (Signed)
Pharmacist from [facility] in [place] called office to inform us that patient is in a gap period with [employer]. Pharmacy would like to know if our office will be offering any type of premium assistance for patient to get her medication. Pharmacist states patient will have to pay $900 premium for medication every month. Patient will have a invoice of $3,000 after grace period. Patient will come in office to see Financial Counselor on 2/27. [place]

## 2018-05-04 NOTE — Progress Notes (Deleted)
Cardiology Office Note    Date:  05/04/2018   ID:  Vicki Brown, DOB 1960/03/16, MRN [id number]  PCP:  No primary care provider on file.  Cardiologist: Dr. Saunders Revel  Chief Complaint: 6   Months follow up  History of Present Illness:   Vicki Brown is a 58 y.o. female hypertension, HLD, HIV, PAF and HTN presents for follow up.   seen by Dr. Saunders Revel 10/21/2017 for the evaluation of chest pain. She was in atrial fibrillation, duration unknown and was having intermittent rapid ventricular rates.  Metoprolol 25 mg twice daily and Xarelto 20 mg once daily were added.  With plans for cardioversion in 1 month.  2D echo showed normal LV function with grade 1 DD, nuclear stress test normal with no ischemia LVEF 72%.  TSH was low. Noted in sinus rhythm during follow up visit 11/2018.   Her TSH was low and found to have hyperthyroidism. Seen by Dr. Arnoldo Lenis. Felt due to nodual goiter. Declined RAI.   Seen by PCP 04/22/18. Elevated liver function test. Advised to avoid daily alcohol use.   Here today for follow up.   Past Medical History:  Diagnosis Date  . Allergy   . HIV infection ([place])   . Hyperlipidemia   . Hypertension   . Insomnia 12/19/2014  . Trichomonas infection 12/19/2014  . Yeast infection 12/19/2014    Past Surgical History:  Procedure Laterality Date  . Broken Arm Left    Pins placed in arm  . TUBAL LIGATION      Current Medications: Prior to Admission medications   Medication Sig Start Date End Date Taking? Authorizing Provider  atorvastatin (LIPITOR) 10 MG tablet Take 1 tablet (10 mg total) by mouth daily. 01/27/17   Campbell Riches, MD  bictegravir-emtricitabine-tenofovir AF (BIKTARVY) 50-200-25 MG TABS tablet Take 1 tablet by mouth daily. 04/22/18   [place] Callas, NP  ibuprofen (ADVIL,MOTRIN) 800 MG tablet TAKE 1 TABLET(800 MG) BY MOUTH TWICE DAILY WITH FOOD AS NEEDED 10/13/17   Campbell Riches, MD  lisinopril-hydrochlorothiazide (ZESTORETIC) 10-12.5 MG tablet Take  1 tablet by mouth daily. 03/28/18   Campbell Riches, MD  metoprolol tartrate (LOPRESSOR) 25 MG tablet Take 1.5 tablets (37.5 mg total) by mouth 2 (two) times daily. 12/01/17 03/01/18  Imogene Burn, PA-C  rivaroxaban (XARELTO) 20 MG TABS tablet Take 1 tablet (20 mg total) by mouth daily with supper. Patient not taking: Reported on 04/22/2018 10/21/17   End, Harrell Gave, MD  traZODone (DESYREL) 50 MG tablet Take 0.5 tablets (25 mg total) by mouth at bedtime. 01/27/17   Campbell Riches, MD    Allergies:   Patient has no known allergies.   Social History   Socioeconomic History  . Marital status: Single    Spouse name: Not on file  . Number of children: Not on file  . Years of education: Not on file  . Highest education level: Not on file  Occupational History  . Not on file  Social Needs  . Financial resource strain: Not on file  . Food insecurity:    Worry: Not on file    Inability: Not on file  . Transportation needs:    Medical: Not on file    Non-medical: Not on file  Tobacco Use  . Smoking status: Former Smoker    Packs/day: 2.00    Years: 20.00    Pack years: 40.00    Types: Cigarettes    Last attempt to quit: 2004  Years since quitting: 16.1  . Smokeless tobacco: Never Used  . Tobacco comment: 20 years ago  Substance and Sexual Activity  . Alcohol use: Yes    Alcohol/week: 6.0 standard drinks    Types: 6 Cans of beer per week  . Drug use: Yes    Frequency: 0.5 times per week    Types: Marijuana  . Sexual activity: Yes    Partners: Male    Birth control/protection: Condom    Comment: accepted condoms  Lifestyle  . Physical activity:    Days per week: Not on file    Minutes per session: Not on file  . Stress: Not on file  Relationships  . Social connections:    Talks on phone: Not on file    Gets together: Not on file    Attends religious service: Not on file    Active member of club or organization: Not on file    Attends meetings of clubs or  organizations: Not on file    Relationship status: Not on file  Other Topics Concern  . Not on file  Social History Narrative  . Not on file     Family History:  The patient's family history includes Breast cancer in her maternal aunt; Cancer in her mother; Diabetes in her daughter; Mental illness in her brother; Seizures in her father. ***  ROS:   Please see the history of present illness.    ROS All other systems reviewed and are negative.   PHYSICAL EXAM:   VS:  There were no vitals taken for this visit.   GEN: Well nourished, well developed, in no acute distress  HEENT: normal  Neck: no JVD, carotid bruits, or masses Cardiac: ***RRR; no murmurs, rubs, or gallops,no edema  Respiratory:  clear to auscultation bilaterally, normal work of breathing GI: soft, nontender, nondistended, + BS MS: no deformity or atrophy  Skin: warm and dry, no rash Neuro:  Alert and Oriented x 3, Strength and sensation are intact Psych: euthymic mood, full affect  Wt Readings from Last 3 Encounters:  04/22/18 170 lb 4 oz (77.2 kg)  01/27/18 167 lb 6.4 oz (75.9 kg)  12/01/17 166 lb (75.3 kg)      Studies/Labs Reviewed:   EKG:  EKG is ordered today.  The ekg ordered today demonstrates ***  Recent Labs: 10/21/2017: Magnesium 2.1; TSH 0.274 03/31/2018: ALT 41; BUN 13; Creat 1.03; Hemoglobin 12.1; Platelets 238; Potassium 4.0; Sodium 140   Lipid Panel    Component Value Date/Time   CHOL 221 (H) 03/31/2018 1032   TRIG 52 03/31/2018 1032   HDL 89 03/31/2018 1032   CHOLHDL 2.5 03/31/2018 1032   VLDL 13 04/22/2016 1344   LDLCALC 118 (H) 03/31/2018 1032    Additional studies/ records that were reviewed today include:   As above    ASSESSMENT & PLAN:    1. PAF - diagnosed with afib in setting of hyperthyroidism   2. HTN    Medication Adjustments/Labs and Tests Ordered: Current medicines are reviewed at length with the patient today.  Concerns regarding medicines are outlined  above.  Medication changes, Labs and Tests ordered today are listed in the Patient Instructions below. There are no Patient Instructions on file for this visit.   Jarrett Soho, [place]  05/04/2018 3:42 PM    [facility] [place], [place], [place]  [address] Phone: [phone]; Fax: [phone]

## 2018-05-05 ENCOUNTER — Ambulatory Visit: Payer: BLUE CROSS/BLUE SHIELD | Admitting: Physician Assistant

## 2018-05-05 ENCOUNTER — Ambulatory Visit: Payer: BLUE CROSS/BLUE SHIELD

## 2018-05-05 DIAGNOSIS — R0989 Other specified symptoms and signs involving the circulatory and respiratory systems: Secondary | ICD-10-CM

## 2018-05-09 ENCOUNTER — Encounter: Payer: Self-pay | Admitting: Physician Assistant

## 2018-06-15 ENCOUNTER — Emergency Department (HOSPITAL_COMMUNITY): Payer: BLUE CROSS/BLUE SHIELD

## 2018-06-15 ENCOUNTER — Encounter (HOSPITAL_COMMUNITY): Payer: Self-pay

## 2018-06-15 ENCOUNTER — Emergency Department (HOSPITAL_COMMUNITY)
Admission: EM | Admit: 2018-06-15 | Discharge: 2018-06-15 | Disposition: A | Discharge status: Home / Self Care | Payer: BLUE CROSS/BLUE SHIELD | Attending: Emergency Medicine | Admitting: Emergency Medicine

## 2018-06-15 ENCOUNTER — Other Ambulatory Visit: Payer: Self-pay

## 2018-06-15 DIAGNOSIS — Z87891 Personal history of nicotine dependence: Secondary | ICD-10-CM | POA: Insufficient documentation

## 2018-06-15 DIAGNOSIS — R0789 Other chest pain: Secondary | ICD-10-CM | POA: Insufficient documentation

## 2018-06-15 DIAGNOSIS — Z21 Asymptomatic human immunodeficiency virus [HIV] infection status: Secondary | ICD-10-CM | POA: Insufficient documentation

## 2018-06-15 DIAGNOSIS — R0981 Nasal congestion: Secondary | ICD-10-CM | POA: Insufficient documentation

## 2018-06-15 DIAGNOSIS — I10 Essential (primary) hypertension: Secondary | ICD-10-CM | POA: Insufficient documentation

## 2018-06-15 DIAGNOSIS — Z79899 Other long term (current) drug therapy: Secondary | ICD-10-CM | POA: Insufficient documentation

## 2018-06-15 LAB — COMPREHENSIVE METABOLIC PANEL
ALT: 44 U/L (ref 0–44)
AST: 56 U/L — ABNORMAL HIGH (ref 15–41)
Albumin: 3.7 g/dL (ref 3.5–5.0)
Alkaline Phosphatase: 75 U/L (ref 38–126)
Anion gap: 13 (ref 5–15)
BUN: 8 mg/dL (ref 6–20)
CO2: 20 mmol/L — ABNORMAL LOW (ref 22–32)
Calcium: 8.8 mg/dL — ABNORMAL LOW (ref 8.9–10.3)
Chloride: 108 mmol/L (ref 98–111)
Creatinine, Ser: 1.03 mg/dL — ABNORMAL HIGH (ref 0.44–1.00)
GFR calc Af Amer: >60 mL/min (ref >60)
GFR calc non Af Amer: 59 mL/min — ABNORMAL LOW (ref >60)
Glucose, Bld: 85 mg/dL (ref 70–99)
Potassium: 3.7 mmol/L (ref 3.5–5.1)
Sodium: 141 mmol/L (ref 135–145)
Total Bilirubin: 0.3 mg/dL (ref 0.3–1.2)
Total Protein: 7.6 g/dL (ref 6.5–8.1)

## 2018-06-15 LAB — CBC WITH DIFFERENTIAL/PLATELET
Abs Immature Granulocytes: 0.01 10*3/uL (ref 0.00–0.07)
Basophils Absolute: 0 10*3/uL (ref 0.0–0.1)
Basophils Relative: 0 %
Eosinophils Absolute: 0 10*3/uL (ref 0.0–0.5)
Eosinophils Relative: 0 %
HCT: 38.6 % (ref 36.0–46.0)
Hemoglobin: 12.3 g/dL (ref 12.0–15.0)
Immature Granulocytes: 0 %
Lymphocytes Relative: 41 %
Lymphs Abs: 1.9 10*3/uL (ref 0.7–4.0)
MCH: 31.8 pg (ref 26.0–34.0)
MCHC: 31.9 g/dL (ref 30.0–36.0)
MCV: 99.7 fL (ref 80.0–100.0)
Monocytes Absolute: 0.3 10*3/uL (ref 0.1–1.0)
Monocytes Relative: 6 %
Neutro Abs: 2.3 10*3/uL (ref 1.7–7.7)
Neutrophils Relative %: 53 %
Platelets: 207 10*3/uL (ref 150–400)
RBC: 3.87 MIL/uL (ref 3.87–5.11)
RDW: 13.4 % (ref 11.5–15.5)
WBC: 4.6 10*3/uL (ref 4.0–10.5)
nRBC: 0 % (ref 0.0–0.2)

## 2018-06-15 LAB — TROPONIN I: Troponin I: <0.03 ng/mL (ref <0.03)

## 2018-06-15 MED ORDER — SODIUM CHLORIDE 0.9 % IV BOLUS
1000.0000 mL | Freq: Once | INTRAVENOUS | Status: AC
  Administered 2018-06-15: 1000 mL via INTRAVENOUS

## 2018-06-15 NOTE — ED Notes (Signed)
Patient verbalizes understanding of discharge instructions. Opportunity for questioning and answers were provided. Armband removed by staff, pt discharged from ED. Ambulated out to lobby  

## 2018-06-15 NOTE — Discharge Instructions (Signed)
Continue your current meds   Take tylenol, motrin for pain   See your doctor for follow up   Return to ER if you have worse chest pain, shortness of breath, fever, cough

## 2018-06-15 NOTE — ED Provider Notes (Signed)
[facility] EMERGENCY DEPARTMENT Provider Note   CSN: [id number] Arrival date & time: 06/15/18  1033    History   Chief Complaint Chief Complaint  Patient presents with   Chest Pain    HPI Vicki Brown is a 58 y.o. female hx of HIV (last CD 4 was normal in Feb 2020), here presenting with congestion, chest pain.  Patient states that she has some congestion 4 days ago.  Has some runny nose but denies any shortness of breath or fevers.  Patient had some substernal chest pain for the last 4 days.  States that the pain is intermittent and is not pleuritic in nature.  Denies any shortness of breath or fevers.  Patient adamantly denies any recent travel or known exposure to coronavirus.      The history is provided by the patient.    Past Medical History:  Diagnosis Date   Allergy    HIV infection ([place])    Hyperlipidemia    Hypertension    Insomnia 12/19/2014   Trichomonas infection 12/19/2014   Yeast infection 12/19/2014    Patient Active Problem List   Diagnosis Date Noted   Elevated liver function tests 04/22/2018   Hyperthyroidism 01/27/2018   Hoarseness 01/27/2018   Shortness of breath 10/21/2017   Atrial fibrillation ([place]) 10/21/2017   Hyperlipidemia 10/21/2017   Right wrist pain 07/03/2015   Insomnia 12/19/2014   Chest pain 11/07/2014   Allergic rhinitis 12/28/2012   Dyslipidemia 07/31/2010   Human immunodeficiency virus (HIV) disease ([place]) 01/19/2008   PERIPHERAL NEUROPATHY 01/19/2008   Essential hypertension 01/19/2008    Past Surgical History:  Procedure Laterality Date   Broken Arm Left    Pins placed in arm   TUBAL LIGATION       OB History    Gravida  2   Para      Term      Preterm      AB      Living  2     SAB      TAB      Ectopic      Multiple      Live Births               Home Medications    Prior to Admission medications   Medication Sig Start Date End Date Taking?  Authorizing Provider  atorvastatin (LIPITOR) 10 MG tablet Take 1 tablet (10 mg total) by mouth daily. 01/27/17   Campbell Riches, MD  bictegravir-emtricitabine-tenofovir AF (BIKTARVY) 50-200-25 MG TABS tablet Take 1 tablet by mouth daily. 04/22/18   [place] Callas, NP  ibuprofen (ADVIL,MOTRIN) 800 MG tablet TAKE 1 TABLET(800 MG) BY MOUTH TWICE DAILY WITH FOOD AS NEEDED 10/13/17   Campbell Riches, MD  lisinopril-hydrochlorothiazide (ZESTORETIC) 10-12.5 MG tablet Take 1 tablet by mouth daily. 03/28/18   Campbell Riches, MD  metoprolol tartrate (LOPRESSOR) 25 MG tablet Take 1.5 tablets (37.5 mg total) by mouth 2 (two) times daily. 12/01/17 03/01/18  Imogene Burn, PA-C  rivaroxaban (XARELTO) 20 MG TABS tablet Take 1 tablet (20 mg total) by mouth daily with supper. Patient not taking: Reported on 04/22/2018 10/21/17   End, Harrell Gave, MD  traZODone (DESYREL) 50 MG tablet Take 0.5 tablets (25 mg total) by mouth at bedtime. 01/27/17   Campbell Riches, MD    Family History Family History  Problem Relation Age of Onset   Cancer Mother        uterine vs  ovarian?   Seizures Father    Mental illness Brother    Breast cancer Maternal Aunt    Diabetes Daughter    Colon polyps Neg Hx    Esophageal cancer Neg Hx    Rectal cancer Neg Hx    Stomach cancer Neg Hx    Thyroid disease Neg Hx     Social History Social History   Tobacco Use   Smoking status: Former Smoker    Packs/day: 2.00    Years: 20.00    Pack years: 40.00    Types: Cigarettes    Last attempt to quit: 2004    Years since quitting: 16.2   Smokeless tobacco: Never Used   Tobacco comment: 20 years ago  Substance Use Topics   Alcohol use: Yes    Alcohol/week: 6.0 standard drinks    Types: 6 Cans of beer per week   Drug use: Yes    Frequency: 0.5 times per week    Types: Marijuana     Allergies   Patient has no known allergies.   Review of Systems Review of Systems  Cardiovascular: Positive  for chest pain.  All other systems reviewed and are negative.    Physical Exam Updated Vital Signs BP (!) 135/99    Pulse 71    Resp 19    SpO2 100%   Physical Exam Vitals signs and nursing note reviewed.  HENT:     Head: Normocephalic.  Eyes:     Extraocular Movements: Extraocular movements intact.     Pupils: Pupils are equal, round, and reactive to light.  Neck:     Musculoskeletal: Normal range of motion and neck supple.  Cardiovascular:     Rate and Rhythm: Normal rate and regular rhythm.     Heart sounds: Normal heart sounds.  Pulmonary:     Effort: Pulmonary effort is normal.     Breath sounds: Normal breath sounds.  Abdominal:     General: Bowel sounds are normal.     Palpations: Abdomen is soft.  Musculoskeletal: Normal range of motion.  Skin:    General: Skin is warm.  Neurological:     General: No focal deficit present.     Mental Status: She is alert and oriented to person, place, and time.      ED Treatments / Results  Labs (all labs ordered are listed, but only abnormal results are displayed) Labs Reviewed  COMPREHENSIVE METABOLIC PANEL - Abnormal; Notable for the following components:      Result Value   CO2 20 (*)    Creatinine, Ser 1.03 (*)    Calcium 8.8 (*)    AST 56 (*)    GFR calc non Af Amer 59 (*)    All other components within normal limits  CBC WITH DIFFERENTIAL/PLATELET  TROPONIN I    EKG EKG Interpretation  Date/Time:  Wednesday June 15 2018 10:42:20 EDT Ventricular Rate:  85 PR Interval:    QRS Duration: 88 QT Interval:  373 QTC Calculation: 444 R Axis:     Text Interpretation:  Sinus rhythm Probable left atrial enlargement RSR' in V1 or V2, probably normal variant Minimal ST elevation, anterior leads Baseline wander in lead(s) II III aVF No significant change since last tracing Confirmed by Wandra Arthurs ([id number]) on 06/15/2018 10:58:00 AM   Radiology Dg Chest Port 1 View  Result Date: 06/15/2018 CLINICAL DATA:  Shortness of  Breath EXAM: PORTABLE CHEST 1 VIEW COMPARISON:  08/01/2015 FINDINGS: Heart and mediastinal contours are  within normal limits. No focal opacities or effusions. No acute bony abnormality. IMPRESSION: No active disease. Electronically Signed   By: Rolm Baptise M.D.   On: 06/15/2018 11:08    Procedures Procedures (including critical care time)  Medications Ordered in ED Medications  sodium chloride 0.9 % bolus 1,000 mL (1,000 mLs Intravenous New Bag/Given 06/15/18 1203)     Initial Impression / Assessment and Plan / ED Course  I have reviewed the triage vital signs and the nursing notes.  Pertinent labs & imaging results that were available during my care of the patient were reviewed by me and considered in my medical decision making (see chart for details).       Vicki Brown is a 58 y.o. female here with chest pain, congestion. Patient is afebrile, no sick contacts. Patient has no known COVID contacts. Has HIV but compliant with meds and CD 4 count normal recently. Low risk for ACS and symptoms for 4 days so trop x 1 sufficient.   1:48 PM Labs unremarkable. CXR clear. Stable for discharge.    Vicki Brown was evaluated in Emergency Department on 06/15/2018 for the symptoms described in the history of present illness. She was evaluated in the context of the global COVID-19 pandemic, which necessitated consideration that the patient might be at risk for infection with the SARS-CoV-2 virus that causes COVID-19. Institutional protocols and algorithms that pertain to the evaluation of patients at risk for COVID-19 are in a state of rapid change based on information released by regulatory bodies including the CDC and federal and state organizations. These policies and algorithms were followed during the patient's care in the ED.     Final Clinical Impressions(s) / ED Diagnoses   Final diagnoses:  None    ED Discharge Orders    None       Drenda Freeze, MD 06/15/18 1349

## 2018-06-15 NOTE — ED Triage Notes (Signed)
Pt reports chest pain X4 days. Central and intermittent in nature. Pt states she did recently have a cold.

## 2018-07-26 ENCOUNTER — Telehealth: Payer: Self-pay

## 2018-07-26 NOTE — Telephone Encounter (Signed)
Received voicemail in triage. Called patient back and patient states she has been experiencing intermittent chest pain. (No further chest pain during time of call) Patient unable to describe  What actually triggers chest pain.Denies any fevers, shortness of breath.  After review of record patient noted with multiple visits to ED with chest pain, A-fib. Patient has insurance but does not have PCP. Patient states she takes all of her meds daily as prescribed except for Lipitor( patient believes this is causing her chest pain)   LPN advised patient to go to ED/urgernt care if chest pain persists. And also advised to find PCP to manage HTN, A-fib, Chest pain ect. Advised patient that [facility] manages HIV disease. Patient understands to seek care at local ED/urgent and contact local providers to establish care with PCP. Eugenia Mcalpine, LPN

## 2018-07-26 NOTE — Telephone Encounter (Signed)
Thank you Cathie Beams

## 2018-11-30 ENCOUNTER — Other Ambulatory Visit: Payer: Self-pay

## 2018-11-30 ENCOUNTER — Ambulatory Visit (INDEPENDENT_AMBULATORY_CARE_PROVIDER_SITE_OTHER): Payer: Self-pay | Admitting: *Deleted

## 2018-11-30 DIAGNOSIS — Z23 Encounter for immunization: Secondary | ICD-10-CM

## 2018-12-06 ENCOUNTER — Encounter: Payer: Self-pay | Admitting: Infectious Diseases

## 2018-12-13 ENCOUNTER — Other Ambulatory Visit: Payer: Self-pay | Admitting: Infectious Diseases

## 2018-12-28 ENCOUNTER — Other Ambulatory Visit: Payer: Self-pay | Admitting: *Deleted

## 2018-12-28 DIAGNOSIS — I10 Essential (primary) hypertension: Secondary | ICD-10-CM

## 2018-12-28 MED ORDER — LISINOPRIL-HYDROCHLOROTHIAZIDE 10-12.5 MG PO TABS: 1.0000 | ORAL_TABLET | Freq: Every day | ORAL | 1 refills | Status: DC

## 2019-03-14 ENCOUNTER — Other Ambulatory Visit: Payer: Self-pay | Admitting: *Deleted

## 2019-03-14 DIAGNOSIS — I10 Essential (primary) hypertension: Secondary | ICD-10-CM

## 2019-03-14 MED ORDER — LISINOPRIL-HYDROCHLOROTHIAZIDE 10-12.5 MG PO TABS: 1.0000 | ORAL_TABLET | Freq: Every day | ORAL | 1 refills | Status: DC

## 2019-04-12 ENCOUNTER — Other Ambulatory Visit: Payer: Self-pay

## 2019-04-12 DIAGNOSIS — B20 Human immunodeficiency virus [HIV] disease: Secondary | ICD-10-CM

## 2019-04-12 DIAGNOSIS — Z113 Encounter for screening for infections with a predominantly sexual mode of transmission: Secondary | ICD-10-CM

## 2019-04-12 DIAGNOSIS — Z79899 Other long term (current) drug therapy: Secondary | ICD-10-CM

## 2019-04-13 ENCOUNTER — Other Ambulatory Visit: Payer: Self-pay

## 2019-04-13 ENCOUNTER — Other Ambulatory Visit: Payer: 59

## 2019-04-13 DIAGNOSIS — Z79899 Other long term (current) drug therapy: Secondary | ICD-10-CM

## 2019-04-13 DIAGNOSIS — Z113 Encounter for screening for infections with a predominantly sexual mode of transmission: Secondary | ICD-10-CM

## 2019-04-13 DIAGNOSIS — B20 Human immunodeficiency virus [HIV] disease: Secondary | ICD-10-CM

## 2019-04-14 LAB — COMPREHENSIVE METABOLIC PANEL
AG Ratio: 1.4 (calc) (ref 1.0–2.5)
ALT: 34 U/L — ABNORMAL HIGH (ref 6–29)
AST: 40 U/L — ABNORMAL HIGH (ref 10–35)
Albumin: 4.1 g/dL (ref 3.6–5.1)
Alkaline phosphatase (APISO): 52 U/L (ref 37–153)
BUN: 11 mg/dL (ref 7–25)
CO2: 25 mmol/L (ref 20–32)
Calcium: 9.3 mg/dL (ref 8.6–10.4)
Chloride: 106 mmol/L (ref 98–110)
Creat: 0.94 mg/dL (ref 0.50–0.99)
Globulin: 2.9 g/dL (calc) (ref 1.9–3.7)
Glucose, Bld: 93 mg/dL (ref 65–99)
Potassium: 4.2 mmol/L (ref 3.5–5.3)
Sodium: 140 mmol/L (ref 135–146)
Total Bilirubin: 0.4 mg/dL (ref 0.2–1.2)
Total Protein: 7 g/dL (ref 6.1–8.1)

## 2019-04-14 LAB — CBC WITH DIFFERENTIAL/PLATELET
Absolute Monocytes: 179 cells/uL — ABNORMAL LOW (ref 200–950)
Basophils Absolute: 10 cells/uL (ref 0–200)
Basophils Relative: 0.4 %
Eosinophils Absolute: 39 cells/uL (ref 15–500)
Eosinophils Relative: 1.5 %
HCT: 36.6 % (ref 35.0–45.0)
Hemoglobin: 12.6 g/dL (ref 11.7–15.5)
Lymphs Abs: 1420 cells/uL (ref 850–3900)
MCH: 33.9 pg — ABNORMAL HIGH (ref 27.0–33.0)
MCHC: 34.4 g/dL (ref 32.0–36.0)
MCV: 98.4 fL (ref 80.0–100.0)
MPV: 9.6 fL (ref 7.5–12.5)
Monocytes Relative: 6.9 %
Neutro Abs: 952 cells/uL — ABNORMAL LOW (ref 1500–7800)
Neutrophils Relative %: 36.6 %
Platelets: 188 10*3/uL (ref 140–400)
RBC: 3.72 10*6/uL — ABNORMAL LOW (ref 3.80–5.10)
RDW: 12.8 % (ref 11.0–15.0)
Total Lymphocyte: 54.6 %
WBC: 2.6 10*3/uL — ABNORMAL LOW (ref 3.8–10.8)

## 2019-04-14 LAB — HIV-1 RNA QUANT-NO REFLEX-BLD
HIV 1 RNA Quant: <20 copies/mL
HIV-1 RNA Quant, Log: <1.3 Log copies/mL

## 2019-04-14 LAB — LIPID PANEL
Cholesterol: 227 mg/dL — ABNORMAL HIGH (ref <200)
HDL: 92 mg/dL (ref >=50)
LDL Cholesterol (Calc): 121 mg/dL (calc) — ABNORMAL HIGH
Non-HDL Cholesterol (Calc): 135 mg/dL (calc) — ABNORMAL HIGH (ref <130)
Total CHOL/HDL Ratio: 2.5 (calc) (ref <5.0)
Triglycerides: 57 mg/dL (ref <150)

## 2019-04-14 LAB — T-HELPER CELL (CD4) - (RCID CLINIC ONLY)
CD4 % Helper T Cell: 61 % (ref 33–65)
CD4 T Cell Abs: 907 /uL (ref 400–1790)

## 2019-04-14 LAB — RPR: RPR Ser Ql: NONREACTIVE

## 2019-04-27 ENCOUNTER — Encounter: Payer: BLUE CROSS/BLUE SHIELD | Admitting: Infectious Diseases

## 2019-05-01 ENCOUNTER — Other Ambulatory Visit: Payer: Self-pay | Admitting: Infectious Diseases

## 2019-05-01 DIAGNOSIS — Z1231 Encounter for screening mammogram for malignant neoplasm of breast: Secondary | ICD-10-CM

## 2019-05-08 ENCOUNTER — Other Ambulatory Visit: Payer: Self-pay

## 2019-05-08 ENCOUNTER — Ambulatory Visit (INDEPENDENT_AMBULATORY_CARE_PROVIDER_SITE_OTHER): Payer: 59

## 2019-05-08 ENCOUNTER — Encounter (HOSPITAL_COMMUNITY): Payer: Self-pay

## 2019-05-08 ENCOUNTER — Ambulatory Visit (HOSPITAL_COMMUNITY)
Admission: EM | Admit: 2019-05-08 | Discharge: 2019-05-08 | Disposition: A | Discharge status: Home / Self Care | Payer: 59 | Attending: Family Medicine | Admitting: Family Medicine

## 2019-05-08 DIAGNOSIS — I10 Essential (primary) hypertension: Secondary | ICD-10-CM | POA: Diagnosis not present

## 2019-05-08 DIAGNOSIS — E785 Hyperlipidemia, unspecified: Secondary | ICD-10-CM | POA: Insufficient documentation

## 2019-05-08 DIAGNOSIS — G629 Polyneuropathy, unspecified: Secondary | ICD-10-CM | POA: Insufficient documentation

## 2019-05-08 DIAGNOSIS — Z79899 Other long term (current) drug therapy: Secondary | ICD-10-CM | POA: Diagnosis not present

## 2019-05-08 DIAGNOSIS — Z87891 Personal history of nicotine dependence: Secondary | ICD-10-CM | POA: Insufficient documentation

## 2019-05-08 DIAGNOSIS — Z7901 Long term (current) use of anticoagulants: Secondary | ICD-10-CM | POA: Insufficient documentation

## 2019-05-08 DIAGNOSIS — R0789 Other chest pain: Secondary | ICD-10-CM | POA: Diagnosis not present

## 2019-05-08 DIAGNOSIS — R0602 Shortness of breath: Secondary | ICD-10-CM | POA: Insufficient documentation

## 2019-05-08 DIAGNOSIS — I4891 Unspecified atrial fibrillation: Secondary | ICD-10-CM | POA: Diagnosis not present

## 2019-05-08 DIAGNOSIS — Z833 Family history of diabetes mellitus: Secondary | ICD-10-CM | POA: Insufficient documentation

## 2019-05-08 DIAGNOSIS — Z21 Asymptomatic human immunodeficiency virus [HIV] infection status: Secondary | ICD-10-CM | POA: Diagnosis not present

## 2019-05-08 DIAGNOSIS — Z20822 Contact with and (suspected) exposure to covid-19: Secondary | ICD-10-CM | POA: Diagnosis not present

## 2019-05-08 MED ORDER — ACETAMINOPHEN 500 MG PO TABS: 1000.0000 mg | ORAL_TABLET | Freq: Three times a day (TID) | ORAL | 0 refills | Status: DC | PRN

## 2019-05-08 NOTE — ED Provider Notes (Signed)
[facility]    CSN: [id number] Arrival date & time: 05/08/19  1329      History   Chief Complaint Chief Complaint  Patient presents with  . Abdominal Pain  . Cough    HPI Vicki Brown is a 59 y.o. female.   Vicki Brown presents with complaints of chest pain with shortness of breath , occasional cough. Coughing causes upper abdominal pain. Currently has chest pain. Pain is to breasts, primarily bilateral lateral breasts/ Sometimes it will resolve, but quickly returns. On and off for the past month. Worse over the past two days. No palpitations. Feels shortness of breath now at rest. Achy sensation. No runny nose, no sore throat. Some headache.  Cough is dry. Pain does wake her up at night sometimes. Although not necessarily worse with laying flat. Denies any previous similar. Hasn't taken any medications for her symptoms. She is on a blood thinner, history of atrial fibrillation. No asthma/ copd history. Doesn't smoke. No known ill contacts. Activity does seem to worsen the pain. No nausea or vomiting. Does feel diaphoresis associated with her symptoms at times. Doesn't follow with a cardiologist. Last mammogram 1 year ago, normal, and has one scheduled in the upcoming weeks. HIV positive, well controlled on medications. Her boyfriend does actively have shingles. She has a history of chicken pox.    ROS per HPI, negative if not otherwise mentioned.      Past Medical History:  Diagnosis Date  . Allergy   . HIV infection ([place])   . Hyperlipidemia   . Hypertension   . Insomnia 12/19/2014  . Trichomonas infection 12/19/2014  . Yeast infection 12/19/2014    Patient Active Problem List   Diagnosis Date Noted  . Elevated liver function tests 04/22/2018  . Hyperthyroidism 01/27/2018  . Hoarseness 01/27/2018  . Shortness of breath 10/21/2017  . Atrial fibrillation ([place]) 10/21/2017  . Hyperlipidemia 10/21/2017  . Right wrist pain 07/03/2015  . Insomnia 12/19/2014    . Chest pain 11/07/2014  . Allergic rhinitis 12/28/2012  . Dyslipidemia 07/31/2010  . Human immunodeficiency virus (HIV) disease ([place]) 01/19/2008  . PERIPHERAL NEUROPATHY 01/19/2008  . Essential hypertension 01/19/2008    Past Surgical History:  Procedure Laterality Date  . Broken Arm Left    Pins placed in arm  . TUBAL LIGATION      OB History    Gravida  2   Para      Term      Preterm      AB      Living  2     SAB      TAB      Ectopic      Multiple      Live Births               Home Medications    Prior to Admission medications   Medication Sig Start Date End Date Taking? Authorizing Provider  acetaminophen (TYLENOL) 500 MG tablet Take 2 tablets (1,000 mg total) by mouth every 8 (eight) hours as needed. 05/08/19   Zigmund Gottron, NP  atorvastatin (LIPITOR) 10 MG tablet Take 1 tablet (10 mg total) by mouth daily. 01/27/17   Campbell Riches, MD  BIKTARVY [phone] MG TABS tablet TAKE 1 TABLET BY MOUTH DAILY 12/13/18   Campbell Riches, MD  ibuprofen (ADVIL,MOTRIN) 800 MG tablet TAKE 1 TABLET(800 MG) BY MOUTH TWICE DAILY WITH FOOD AS NEEDED 10/13/17   Campbell Riches, MD  lisinopril-hydrochlorothiazide (ZESTORETIC) 10-12.5 MG tablet Take 1 tablet by mouth daily. 03/14/19   Campbell Riches, MD  metoprolol tartrate (LOPRESSOR) 25 MG tablet Take 1.5 tablets (37.5 mg total) by mouth 2 (two) times daily. 12/01/17 03/01/18  Imogene Burn, PA-C  rivaroxaban (XARELTO) 20 MG TABS tablet Take 1 tablet (20 mg total) by mouth daily with supper. Patient not taking: Reported on 04/22/2018 10/21/17   End, Harrell Gave, MD  traZODone (DESYREL) 50 MG tablet Take 0.5 tablets (25 mg total) by mouth at bedtime. 01/27/17   Campbell Riches, MD    Family History Family History  Problem Relation Age of Onset  . Cancer Mother        uterine vs ovarian?  . Seizures Father   . Mental illness Brother   . Breast cancer Maternal Aunt   . Diabetes Daughter   . Colon  polyps Neg Hx   . Esophageal cancer Neg Hx   . Rectal cancer Neg Hx   . Stomach cancer Neg Hx   . Thyroid disease Neg Hx     Social History Social History   Tobacco Use  . Smoking status: Former Smoker    Packs/day: 2.00    Years: 20.00    Pack years: 40.00    Types: Cigarettes    Quit date: 2004    Years since quitting: 17.1  . Smokeless tobacco: Never Used  . Tobacco comment: 20 years ago  Substance Use Topics  . Alcohol use: Yes    Alcohol/week: 6.0 standard drinks    Types: 6 Cans of beer per week  . Drug use: Yes    Frequency: 0.5 times per week    Types: Marijuana     Allergies   Patient has no known allergies.   Review of Systems Review of Systems   Physical Exam Triage Vital Signs ED Triage Vitals  Enc Vitals Group     BP 05/08/19 1351 138/82     Pulse Rate 05/08/19 1351 84     Resp 05/08/19 1351 18     Temp 05/08/19 1351 98.8 F (37.1 C)     Temp src --      SpO2 05/08/19 1351 96 %     Weight --      Height --      Head Circumference --      Peak Flow --      Pain Score 05/08/19 1348 6     Pain Loc --      Pain Edu? --      Excl. in [facility]? --    No data found.  Updated Vital Signs BP 138/82   Pulse 84   Temp 98.8 F (37.1 C)   Resp 18   SpO2 96%   Visual Acuity Right Eye Distance:   Left Eye Distance:   Bilateral Distance:    Right Eye Near:   Left Eye Near:    Bilateral Near:     Physical Exam Constitutional:      General: She is not in acute distress.    Appearance: She is well-developed.  Cardiovascular:     Rate and Rhythm: Normal rate.  Pulmonary:     Effort: Pulmonary effort is normal.  Chest:     Breasts:        Right: No mass or skin change.        Left: Tenderness present. No mass or skin change.       Comments: Tenderness on palpation to left lateral  breast to left lateral chest wall without palpable abnormalities, no visible rash Abdominal:     Palpations: Abdomen is soft.     Tenderness: There is no  abdominal tenderness.  Skin:    General: Skin is warm and dry.  Neurological:     Mental Status: She is alert and oriented to person, place, and time.    EKG:  Sinus rhythm rate of 78, pvc's noted . Previous EKG was available for review. No stwave changes as interpreted by me.    Reviewed with supervising physician dr. Meda Coffee.  UC Treatments / Results  Labs (all labs ordered are listed, but only abnormal results are displayed) Labs Reviewed  NOVEL CORONAVIRUS, NAA (HOSP ORDER, SEND-OUT TO REF LAB; TAT 18-24 HRS)    EKG   Radiology DG Abd Acute W/Chest  Result Date: 05/08/2019 CLINICAL DATA:  Intermittent upper abdominal pain for 2 months. EXAM: DG ABDOMEN ACUTE W/ 1V CHEST COMPARISON:  Chest and two views abdomen 07/09/2013. FINDINGS: Single-view of the chest demonstrates clear lungs. Heart size is normal. No pneumothorax or pleural effusion. Small hiatal hernia is noted. Two views of the abdomen show no free intraperitoneal air. The bowel gas pattern is normal. No unexpected abdominal calcification. No acute or focal bony abnormality. IMPRESSION: No acute finding chest or abdomen. Small hiatal hernia. Electronically Signed   By: Inge Rise M.D.   On: 05/08/2019 14:21    Procedures Procedures (including critical care time)  Medications Ordered in UC Medications - No data to display  Initial Impression / Assessment and Plan / UC Course  I have reviewed the triage vital signs and the nursing notes.  Pertinent labs & imaging results that were available during my care of the patient were reviewed by me and considered in my medical decision making (see chart for details).     Chest wall pain on exam with subjective shortness of breath . Vitals stable. No changes to ekg. Chest xray without acute findings. Patient is in no apparent distress. Active, alert and pleasant. Symptoms for a month, worse over the past few days. Taking meds for bp and still on blood thinner with history  of afib, in a regular rhythm today. Boyfriend with active shingles, no lesions visualized, discussed to monitor for lesions as pain is primarily to left lateral trunk. History and physical are not consistent with acs at this time. Encouraged patient to follow up with cardiology again with her history, however. Strict er precautions provided. Patient verbalized understanding and agreeable to plan. Ambulatory out of clinic without difficulty.   Final Clinical Impressions(s) / UC Diagnoses   Final diagnoses:  Chest wall pain     Discharge Instructions     Your ekg and chest/ abdominal xray look well today.  I would like you to follow up with cardiology for recheck in the next two weeks, however.  Tylenol as needed for pain every 8 hours, 1000mg .  Monitor for signs of rash to the left chest/breast, may return or see your infectious disease provider if rash develops.  If worsening of chest pain , shortness of breath , dizziness, nausea, sweating, or otherwise worsening please go to the ER.     ED Prescriptions    Medication Sig Dispense Auth. Provider   acetaminophen (TYLENOL) 500 MG tablet Take 2 tablets (1,000 mg total) by mouth every 8 (eight) hours as needed. 30 tablet Zigmund Gottron, NP     PDMP not reviewed this encounter.   Zigmund Gottron, NP  05/09/19 1205  

## 2019-05-08 NOTE — Discharge Instructions (Signed)
Your ekg and chest/ abdominal xray look well today.  I would like you to follow up with cardiology for recheck in the next two weeks, however.  Tylenol as needed for pain every 8 hours, 1000mg .  Monitor for signs of rash to the left chest/breast, may return or see your infectious disease provider if rash develops.  If worsening of chest pain , shortness of breath , dizziness, nausea, sweating, or otherwise worsening please go to the ER.

## 2019-05-08 NOTE — ED Triage Notes (Signed)
Pt presents with complaints of dry cough, back pain that radiates to abdomen and "a little" shortness of breath x 1 week. Denies any other symptoms at this time.

## 2019-05-10 LAB — NOVEL CORONAVIRUS, NAA (HOSP ORDER, SEND-OUT TO REF LAB; TAT 18-24 HRS): SARS-CoV-2, NAA: NOT DETECTED

## 2019-05-12 ENCOUNTER — Other Ambulatory Visit: Payer: Self-pay

## 2019-05-12 ENCOUNTER — Emergency Department (HOSPITAL_COMMUNITY)
Admission: EM | Admit: 2019-05-12 | Discharge: 2019-05-12 | Disposition: A | Discharge status: Home / Self Care | Payer: 59 | Attending: Emergency Medicine | Admitting: Emergency Medicine

## 2019-05-12 ENCOUNTER — Encounter (HOSPITAL_COMMUNITY): Payer: Self-pay

## 2019-05-12 DIAGNOSIS — Z79899 Other long term (current) drug therapy: Secondary | ICD-10-CM | POA: Insufficient documentation

## 2019-05-12 DIAGNOSIS — I10 Essential (primary) hypertension: Secondary | ICD-10-CM | POA: Insufficient documentation

## 2019-05-12 DIAGNOSIS — Z21 Asymptomatic human immunodeficiency virus [HIV] infection status: Secondary | ICD-10-CM | POA: Insufficient documentation

## 2019-05-12 DIAGNOSIS — R1013 Epigastric pain: Secondary | ICD-10-CM | POA: Diagnosis present

## 2019-05-12 DIAGNOSIS — Z87891 Personal history of nicotine dependence: Secondary | ICD-10-CM | POA: Diagnosis not present

## 2019-05-12 DIAGNOSIS — K2900 Acute gastritis without bleeding: Secondary | ICD-10-CM

## 2019-05-12 LAB — COMPREHENSIVE METABOLIC PANEL
ALT: 27 U/L (ref 0–44)
AST: 36 U/L (ref 15–41)
Albumin: 3.7 g/dL (ref 3.5–5.0)
Alkaline Phosphatase: 55 U/L (ref 38–126)
Anion gap: 8 (ref 5–15)
BUN: 10 mg/dL (ref 6–20)
CO2: 27 mmol/L (ref 22–32)
Calcium: 9.2 mg/dL (ref 8.9–10.3)
Chloride: 104 mmol/L (ref 98–111)
Creatinine, Ser: 1.01 mg/dL — ABNORMAL HIGH (ref 0.44–1.00)
GFR calc Af Amer: >60 mL/min (ref >60)
GFR calc non Af Amer: >60 mL/min (ref >60)
Glucose, Bld: 93 mg/dL (ref 70–99)
Potassium: 3.7 mmol/L (ref 3.5–5.1)
Sodium: 139 mmol/L (ref 135–145)
Total Bilirubin: 0.6 mg/dL (ref 0.3–1.2)
Total Protein: 7.6 g/dL (ref 6.5–8.1)

## 2019-05-12 LAB — LIPASE, BLOOD: Lipase: 26 U/L (ref 11–51)

## 2019-05-12 LAB — URINALYSIS, ROUTINE W REFLEX MICROSCOPIC
Bilirubin Urine: NEGATIVE
Glucose, UA: NEGATIVE mg/dL
Hgb urine dipstick: NEGATIVE
Ketones, ur: NEGATIVE mg/dL
Leukocytes,Ua: NEGATIVE
Nitrite: NEGATIVE
Protein, ur: NEGATIVE mg/dL
Specific Gravity, Urine: 1.008 (ref 1.005–1.030)
pH: 6 (ref 5.0–8.0)

## 2019-05-12 LAB — CBC
HCT: 43.3 % (ref 36.0–46.0)
Hemoglobin: 14.2 g/dL (ref 12.0–15.0)
MCH: 33.3 pg (ref 26.0–34.0)
MCHC: 32.8 g/dL (ref 30.0–36.0)
MCV: 101.6 fL — ABNORMAL HIGH (ref 80.0–100.0)
Platelets: 197 10*3/uL (ref 150–400)
RBC: 4.26 MIL/uL (ref 3.87–5.11)
RDW: 12.4 % (ref 11.5–15.5)
WBC: 3.5 10*3/uL — ABNORMAL LOW (ref 4.0–10.5)
nRBC: 0 % (ref 0.0–0.2)

## 2019-05-12 LAB — TROPONIN I (HIGH SENSITIVITY): Troponin I (High Sensitivity): 3 ng/L (ref <18)

## 2019-05-12 MED ORDER — FAMOTIDINE IN NACL 20-0.9 MG/50ML-% IV SOLN
20.0000 mg | Freq: Once | INTRAVENOUS | Status: AC
  Administered 2019-05-12: 20 mg via INTRAVENOUS
  Filled 2019-05-12: qty 50

## 2019-05-12 MED ORDER — FAMOTIDINE 20 MG PO TABS: 20.0000 mg | ORAL_TABLET | Freq: Two times a day (BID) | ORAL | 0 refills | Status: DC

## 2019-05-12 MED ORDER — ALUM & MAG HYDROXIDE-SIMETH 200-200-20 MG/5ML PO SUSP
30.0000 mL | Freq: Once | ORAL | Status: AC
  Administered 2019-05-12: 30 mL via ORAL
  Filled 2019-05-12: qty 30

## 2019-05-12 MED ORDER — SODIUM CHLORIDE 0.9% FLUSH: 3.0000 mL | Freq: Once | INTRAVENOUS | Status: DC

## 2019-05-12 NOTE — ED Provider Notes (Signed)
[facility] DEPT Provider Note   CSN: [id number] Arrival date & time: 05/12/19  1016     History Chief Complaint  Patient presents with  . Abdominal Pain    Vicki Brown is a 59 y.o. female with a past medical history of HIV controlled on medications with last CD4 count normal one month ago, HTN, HLD who presents to ED with a chief complaint of epigastric and lower chest pain for the past week. Reports pain has been sharp, intermittent, worse with certain foods. Reports some improvement with belching and passing gas. Has been taking alka seltzer and miralax without minimal improvement in symptoms. Normal bowel movements without any pain. Reports nausea but denies any vomiting. She is unsure if this is something related to chest or abdomen. Denies history of similar symptoms in the past. Reports occasional alcohol use but denies daily use. Denies shortness of breath, fever, chronic NSAID use, history of PE, MI, urinary symptoms, lower abdominal pain, vaginal complaints.  HPI     Past Medical History:  Diagnosis Date  . Allergy   . HIV infection ([place])   . Hyperlipidemia   . Hypertension   . Insomnia 12/19/2014  . Trichomonas infection 12/19/2014  . Yeast infection 12/19/2014    Patient Active Problem List   Diagnosis Date Noted  . Elevated liver function tests 04/22/2018  . Hyperthyroidism 01/27/2018  . Hoarseness 01/27/2018  . Shortness of breath 10/21/2017  . Atrial fibrillation ([place]) 10/21/2017  . Hyperlipidemia 10/21/2017  . Right wrist pain 07/03/2015  . Insomnia 12/19/2014  . Chest pain 11/07/2014  . Allergic rhinitis 12/28/2012  . Dyslipidemia 07/31/2010  . Human immunodeficiency virus (HIV) disease ([place]) 01/19/2008  . PERIPHERAL NEUROPATHY 01/19/2008  . Essential hypertension 01/19/2008    Past Surgical History:  Procedure Laterality Date  . Broken Arm Left    Pins placed in arm  . TUBAL LIGATION       OB History    Gravida   2   Para      Term      Preterm      AB      Living  2     SAB      TAB      Ectopic      Multiple      Live Births              Family History  Problem Relation Age of Onset  . Cancer Mother        uterine vs ovarian?  . Seizures Father   . Mental illness Brother   . Breast cancer Maternal Aunt   . Diabetes Daughter   . Colon polyps Neg Hx   . Esophageal cancer Neg Hx   . Rectal cancer Neg Hx   . Stomach cancer Neg Hx   . Thyroid disease Neg Hx     Social History   Tobacco Use  . Smoking status: Former Smoker    Packs/day: 2.00    Years: 20.00    Pack years: 40.00    Types: Cigarettes    Quit date: 2004    Years since quitting: 17.1  . Smokeless tobacco: Never Used  . Tobacco comment: 20 years ago  Substance Use Topics  . Alcohol use: Yes    Alcohol/week: 6.0 standard drinks    Types: 6 Cans of beer per week  . Drug use: Yes    Frequency: 0.5 times per week    Types: Marijuana  Home Medications Prior to Admission medications   Medication Sig Start Date End Date Taking? Authorizing Provider  acetaminophen (TYLENOL) 500 MG tablet Take 2 tablets (1,000 mg total) by mouth every 8 (eight) hours as needed. Patient taking differently: Take 1,000 mg by mouth every 8 (eight) hours as needed for mild pain or headache.  05/08/19  Yes Burky, Natalie B, NP  BIKTARVY 50-200-25 MG TABS tablet TAKE 1 TABLET BY MOUTH DAILY Patient taking differently: Take 1 tablet by mouth daily.  12/13/18  Yes Campbell Riches, MD  lisinopril-hydrochlorothiazide (ZESTORETIC) 10-12.5 MG tablet Take 1 tablet by mouth daily. 03/14/19  Yes Campbell Riches, MD  traZODone (DESYREL) 50 MG tablet Take 0.5 tablets (25 mg total) by mouth at bedtime. Patient taking differently: Take 25 mg by mouth at bedtime as needed for sleep.  01/27/17  Yes Campbell Riches, MD  atorvastatin (LIPITOR) 10 MG tablet Take 1 tablet (10 mg total) by mouth daily. Patient not taking: Reported on  05/12/2019 01/27/17   Campbell Riches, MD  famotidine (PEPCID) 20 MG tablet Take 1 tablet (20 mg total) by mouth 2 (two) times daily. 05/12/19   Alacia Rehmann, PA-C  ibuprofen (ADVIL,MOTRIN) 800 MG tablet TAKE 1 TABLET(800 MG) BY MOUTH TWICE DAILY WITH FOOD AS NEEDED Patient not taking: Reported on 05/12/2019 10/13/17   Campbell Riches, MD  metoprolol tartrate (LOPRESSOR) 25 MG tablet Take 1.5 tablets (37.5 mg total) by mouth 2 (two) times daily. Patient not taking: Reported on 05/12/2019 12/01/17 03/01/18  Imogene Burn, PA-C  rivaroxaban (XARELTO) 20 MG TABS tablet Take 1 tablet (20 mg total) by mouth daily with supper. Patient not taking: Reported on 04/22/2018 10/21/17   End, Harrell Gave, MD    Allergies    Patient has no known allergies.  Review of Systems   Review of Systems  Constitutional: Negative for appetite change, chills and fever.  HENT: Negative for ear pain, rhinorrhea, sneezing and sore throat.   Eyes: Negative for photophobia and visual disturbance.  Respiratory: Negative for cough, chest tightness, shortness of breath and wheezing.   Cardiovascular: Negative for chest pain and palpitations.  Gastrointestinal: Positive for abdominal pain and nausea. Negative for blood in stool, constipation, diarrhea and vomiting.  Genitourinary: Negative for dysuria, hematuria and urgency.  Musculoskeletal: Negative for myalgias.  Skin: Negative for rash.  Neurological: Negative for dizziness, weakness and light-headedness.    Physical Exam Updated Vital Signs BP 137/86   Pulse 69   Temp 98.6 F (37 C) (Oral)   Resp 19   Ht 5\' 7"  (1.702 m)   Wt 74.8 kg   SpO2 99%   BMI 25.84 kg/m   Physical Exam Vitals and nursing note reviewed.  Constitutional:      General: She is not in acute distress.    Appearance: She is well-developed.  HENT:     Head: Normocephalic and atraumatic.     Nose: Nose normal.  Eyes:     General: No scleral icterus.       Left eye: No discharge.      Conjunctiva/sclera: Conjunctivae normal.  Cardiovascular:     Rate and Rhythm: Normal rate and regular rhythm.     Heart sounds: Normal heart sounds. No murmur. No friction rub. No gallop.   Pulmonary:     Effort: Pulmonary effort is normal. No respiratory distress.     Breath sounds: Normal breath sounds.  Abdominal:     General: Bowel sounds are normal. There is no  distension.     Palpations: Abdomen is soft.     Tenderness: There is abdominal tenderness in the right upper quadrant, epigastric area and left upper quadrant. There is no guarding.  Musculoskeletal:        General: Normal range of motion.     Cervical back: Normal range of motion and neck supple.  Skin:    General: Skin is warm and dry.     Findings: No rash.  Neurological:     Mental Status: She is alert.     Motor: No abnormal muscle tone.     Coordination: Coordination normal.     ED Results / Procedures / Treatments   Labs (all labs ordered are listed, but only abnormal results are displayed) Labs Reviewed  COMPREHENSIVE METABOLIC PANEL - Abnormal; Notable for the following components:      Result Value   Creatinine, Ser 1.01 (*)    All other components within normal limits  CBC - Abnormal; Notable for the following components:   WBC 3.5 (*)    MCV 101.6 (*)    All other components within normal limits  URINALYSIS, ROUTINE W REFLEX MICROSCOPIC - Abnormal; Notable for the following components:   Color, Urine STRAW (*)    All other components within normal limits  LIPASE, BLOOD  TROPONIN I (HIGH SENSITIVITY)    EKG EKG Interpretation  Date/Time:  Friday May 12 2019 12:02:35 EST Ventricular Rate:  81 PR Interval:    QRS Duration: 88 QT Interval:  414 QTC Calculation: 481 R Axis:     Text Interpretation: Sinus rhythm Abnormal R-wave progression, early transition No significant change since 3/21 Confirmed by Aletta Edouard [phone]) on 05/12/2019 12:24:06 PM   Radiology No results  found.  Procedures Procedures (including critical care time)  Medications Ordered in ED Medications  alum & mag hydroxide-simeth (MAALOX/MYLANTA) 200-200-20 MG/5ML suspension 30 mL (30 mLs Oral Given 05/12/19 1240)  famotidine (PEPCID) IVPB 20 mg premix (20 mg Intravenous New Bag/Given (Non-Interop) 05/12/19 1323)    ED Course  I have reviewed the triage vital signs and the nursing notes.  Pertinent labs & imaging results that were available during my care of the patient were reviewed by me and considered in my medical decision making (see chart for details).  Clinical Course as of May 12 1434  Fri Mar 05, 661  [id number] 59 year old female complaining of lower chest upper abdominal pain going back on and off for the past week or so.  She said she is tried over-the-counter medications without any improvement.  Labs coming back not showing any significant findings.  Waiting on troponin urinalysis.  Symptomatic treatment.   [MB]    Clinical Course User Index [MB] Hayden Rasmussen, MD   MDM Rules/Calculators/A&P                      59 yo F with a past medical history of HIV with last CD4 count normal last month, HTN, HLD, who presents to ED with a chief complaint of upper abdominal pain, lower chest pain for the past week. Pain has been intermittent, worse with certain foods. Has tried OTC medications with mi Enimal improvement. Denies shortness of breath or pleuritic pain. On exam patient is overall well appearing. There is TTP of the upper abdomen without rebound or guarding noted. She is speaking complete sentences without difficulty. Vital signs are reassuring, she is not tachycardic, tachypnec or hypoxic. Workup here including EKG without changes from prior  tracings.  CBC, CMP, lipase and troponin x1 unremarkable. UA without any signs of infection. Patient given Pepcid and maalox with resolution of symptoms. Repeat abdominal exams are benign. Suspect there her symptoms are due to gastritis vs  reflux. Doubt ACS, PE or other emergent cardiac or pulmonary cause of her symptoms as her workup here is reassuring. Doubt pancreatitis or cholecystitis. Will discharge home with symptomatic treatment and GI f/u for further workup if needed. Patient agreeable to plan.  Patient is hemodynamically stable, in NAD, and able to ambulate in the ED. Evaluation does not show pathology that would require ongoing emergent intervention or inpatient treatment. I have personally reviewed and interpreted all lab work and imaging at today's ED visit. I explained the diagnosis to the patient. Pain has been managed and has no complaints prior to discharge. Patient is comfortable with above plan and is stable for discharge at this time. All questions were answered prior to disposition. Strict return precautions for returning to the ED were discussed. Encouraged follow up with PCP.   An After Visit Summary was printed and given to the patient.   Portions of this note were generated with [employer]. Dictation errors may occur despite best attempts at proofreading.  Final Clinical Impression(s) / ED Diagnoses Final diagnoses:  Acute gastritis without hemorrhage, unspecified gastritis type    Rx / DC Orders ED Discharge Orders         Ordered    famotidine (PEPCID) 20 MG tablet  2 times daily     05/12/19 [place], Iban Utz, PA-C 05/12/19 1436    Hayden Rasmussen, MD 05/12/19 1816

## 2019-05-12 NOTE — Discharge Instructions (Signed)
Take the Pepcid and Maalox as directed to help with your symptoms.  Return to the ED if you start to have worsening pain, vomiting or coughing up blood, shortness of breath or chest pain.

## 2019-05-12 NOTE — ED Triage Notes (Signed)
Patient c/o abdominal pain that radiates "all around into the back" x 6 days. Patient states she went to an UC 4 days ago and they did x-rays.

## 2019-05-19 ENCOUNTER — Ambulatory Visit (INDEPENDENT_AMBULATORY_CARE_PROVIDER_SITE_OTHER): Payer: 59 | Admitting: Infectious Diseases

## 2019-05-19 ENCOUNTER — Other Ambulatory Visit: Payer: Self-pay

## 2019-05-19 ENCOUNTER — Encounter: Payer: Self-pay | Admitting: Infectious Diseases

## 2019-05-19 VITALS — BP 127/86 | HR 76 | Temp 98.4°F | Wt 166.0 lb

## 2019-05-19 DIAGNOSIS — B20 Human immunodeficiency virus [HIV] disease: Secondary | ICD-10-CM

## 2019-05-19 DIAGNOSIS — E0590 Thyrotoxicosis, unspecified without thyrotoxic crisis or storm: Secondary | ICD-10-CM | POA: Diagnosis not present

## 2019-05-19 DIAGNOSIS — I10 Essential (primary) hypertension: Secondary | ICD-10-CM | POA: Diagnosis not present

## 2019-05-19 DIAGNOSIS — I480 Paroxysmal atrial fibrillation: Secondary | ICD-10-CM

## 2019-05-19 NOTE — Patient Instructions (Addendum)
Nice to see you!  Please schedule an appointment with a new primary care doctor - this is the best for your care so we can ensure you have what you need for wellness and preventative care. You have a few things going on that we need the help of a healthcare team!  Please schedule an appointment with the cardiology office (heart doctor) for a follow up with your blood thinner and atrial fibrillation (funny heart rhythm).   Stomach Doctor - please call for an appointment -  Address: [address], [place], [place] [address] Hours:  Phone: [phone]  Your pap smear is due in December 2021 - please schedule an appointment with me then.       Gastroesophageal Reflux Disease, Adult Gastroesophageal reflux (GER) happens when acid from the stomach flows up into the tube that connects the mouth and the stomach (esophagus). Normally, food travels down the esophagus and stays in the stomach to be digested. With GER, food and stomach acid sometimes move back up into the esophagus. You may have a disease called gastroesophageal reflux disease (GERD) if the reflux:  Happens often.  Causes frequent or very bad symptoms.  Causes problems such as damage to the esophagus. When this happens, the esophagus becomes sore and swollen (inflamed). Over time, GERD can make small holes (ulcers) in the lining of the esophagus. What are the causes? This condition is caused by a problem with the muscle between the esophagus and the stomach. When this muscle is weak or not normal, it does not close properly to keep food and acid from coming back up from the stomach. The muscle can be weak because of:  Tobacco use.  Pregnancy.  Having a certain type of hernia (hiatal hernia).  Alcohol use.  Certain foods and drinks, such as coffee, chocolate, onions, and peppermint. What increases the risk? You are more likely to develop this condition if you:  Are overweight.  Have a disease that affects your connective  tissue.  Use NSAID medicines. What are the signs or symptoms? Symptoms of this condition include:  Heartburn.  Difficult or painful swallowing.  The feeling of having a lump in the throat.  A bitter taste in the mouth.  Bad breath.  Having a lot of saliva.  Having an upset or bloated stomach.  Belching.  Chest pain. Different conditions can cause chest pain. Make sure you see your doctor if you have chest pain.  Shortness of breath or noisy breathing (wheezing).  Ongoing (chronic) cough or a cough at night.  Wearing away of the surface of teeth (tooth enamel).  Weight loss. How is this treated? Treatment will depend on how bad your symptoms are. Your doctor may suggest:  Changes to your diet.  Medicine.  Surgery. Follow these instructions at home: Eating and drinking   Follow a diet as told by your doctor. You may need to avoid foods and drinks such as: ? Coffee and tea (with or without caffeine). ? Drinks that contain alcohol. ? Energy drinks and sports drinks. ? Bubbly (carbonated) drinks or sodas. ? Chocolate and cocoa. ? Peppermint and mint flavorings. ? Garlic and onions. ? Horseradish. ? Spicy and acidic foods. These include peppers, chili powder, curry powder, vinegar, hot sauces, and BBQ sauce. ? Citrus fruit juices and citrus fruits, such as oranges, lemons, and limes. ? Tomato-based foods. These include red sauce, chili, salsa, and pizza with red sauce. ? Fried and fatty foods. These include donuts, french fries, potato  chips, and high-fat dressings. ? High-fat meats. These include hot dogs, rib eye steak, sausage, ham, and bacon. ? High-fat dairy items, such as whole milk, butter, and cream cheese.  Eat small meals often. Avoid eating large meals.  Avoid drinking large amounts of liquid with your meals.  Avoid eating meals during the 2-3 hours before bedtime.  Avoid lying down right after you eat.  Do not exercise right after you  eat. Lifestyle   Do not use any products that contain nicotine or tobacco. These include cigarettes, e-cigarettes, and chewing tobacco. If you need help quitting, ask your doctor.  Try to lower your stress. If you need help doing this, ask your doctor.  If you are overweight, lose an amount of weight that is healthy for you. Ask your doctor about a safe weight loss goal. General instructions  Pay attention to any changes in your symptoms.  Take over-the-counter and prescription medicines only as told by your doctor. Do not take aspirin, ibuprofen, or other NSAIDs unless your doctor says it is okay.  Wear loose clothes. Do not wear anything tight around your waist.  Raise (elevate) the head of your bed about 6 inches (15 cm).  Avoid bending over if this makes your symptoms worse.  Keep all follow-up visits as told by your doctor. This is important. Contact a doctor if:  You have new symptoms.  You lose weight and you do not know why.  You have trouble swallowing or it hurts to swallow.  You have wheezing or a cough that keeps happening.  Your symptoms do not get better with treatment.  You have a hoarse voice. Get help right away if:  You have pain in your arms, neck, jaw, teeth, or back.  You feel sweaty, dizzy, or light-headed.  You have chest pain or shortness of breath.  You throw up (vomit) and your throw-up looks like blood or coffee grounds.  You pass out (faint).  Your poop (stool) is bloody or black.  You cannot swallow, drink, or eat. Summary  If a person has gastroesophageal reflux disease (GERD), food and stomach acid move back up into the esophagus and cause symptoms or problems such as damage to the esophagus.  Treatment will depend on how bad your symptoms are.  Follow a diet as told by your doctor.  Take all medicines only as told by your doctor. This information is not intended to replace advice given to you by your health care provider. Make  sure you discuss any questions you have with your health care provider. Document Revised: 09/01/2017 Document Reviewed: 09/01/2017 Elsevier Patient Education  [place].

## 2019-05-19 NOTE — Progress Notes (Signed)
Name: Vicki Brown  DOB: [birth year] MRN: [id number] PCP: Patient, No Pcp Per    Patient Active Problem List   Diagnosis Date Noted  . Elevated liver function tests 04/22/2018  . Hyperthyroidism 01/27/2018  . Hoarseness 01/27/2018  . Atrial fibrillation ([place]) 10/21/2017  . Hyperlipidemia 10/21/2017  . Insomnia 12/19/2014  . Chest pain 11/07/2014  . Allergic rhinitis 12/28/2012  . Dyslipidemia 07/31/2010  . Human immunodeficiency virus (HIV) disease ([place]) 01/19/2008  . PERIPHERAL NEUROPATHY 01/19/2008  . Essential hypertension 01/19/2008     Brief Narrative:  Vicki Brown is a 59 y.o. female with HIV infection, Dx March 2009. History of OIs: none   Previous Regimens: . Atripla . Biktarvy  Genotypes: . No RT/PI mutations in past   Subjective:  CC:  HIV follow up care 1 year follow up Concerned about skin color change to cheek     HPI: Vicki Brown has been doing well overall. She has had some nasal congestion related to allergies that have come up. Had her first COVID vaccine recently and so far did well without side effects.   She went to Urgent Care recently for chest pain / burning - She has been given Famotidine touse BID with follow up in GI clinic; has not made this appointment yet.   She is requesting a refill of her Xarelto - has been on this for 2 years now but not sure she knows what it is for. LOV with Cardiology (Dr. Saunders Revel) in September 2019 and I see she has a history of AFib.   She has had spots on the face with skin darkening that has gotten worse over the last 3 months. Her skin is never bumpy and not open. Not itchy. Has tried using topical lotions and Vaseline to help but seems to be getting worse. Initially noticed these many years ago and thinks Dr. Johnnye Brown gave her a cream for it.     Review of Systems  Constitutional: Negative for chills, fever, malaise/fatigue and weight loss.  HENT: Negative for sore throat.        No dental problems   Respiratory: Negative for cough and sputum production.   Cardiovascular: Negative for chest pain and leg swelling.  Gastrointestinal: Negative for abdominal pain, diarrhea and vomiting.  Genitourinary: Negative for dysuria and flank pain.  Musculoskeletal: Negative for joint pain, myalgias and neck pain.  Skin: Positive for rash.  Neurological: Negative for dizziness, tingling and headaches.  Psychiatric/Behavioral: Negative for depression and substance abuse. The patient is not nervous/anxious and does not have insomnia.     Past Medical History:  Diagnosis Date  . Allergy   . HIV infection ([place])   . Hyperlipidemia   . Hypertension   . Insomnia 12/19/2014    Outpatient Medications Prior to Visit  Medication Sig Dispense Refill  . acetaminophen (TYLENOL) 500 MG tablet Take 2 tablets (1,000 mg total) by mouth every 8 (eight) hours as needed. (Patient taking differently: Take 1,000 mg by mouth every 8 (eight) hours as needed for mild pain or headache. ) 30 tablet 0  . atorvastatin (LIPITOR) 10 MG tablet Take 1 tablet (10 mg total) by mouth daily. 30 tablet 5  . BIKTARVY 50-200-25 MG TABS tablet TAKE 1 TABLET BY MOUTH DAILY (Patient taking differently: Take 1 tablet by mouth daily. ) 30 tablet 5  . famotidine (PEPCID) 20 MG tablet Take 1 tablet (20 mg total) by mouth 2 (two) times daily. 30 tablet 0  . ibuprofen (ADVIL,MOTRIN) 800  MG tablet TAKE 1 TABLET(800 MG) BY MOUTH TWICE DAILY WITH FOOD AS NEEDED 30 tablet 0  . lisinopril-hydrochlorothiazide (ZESTORETIC) 10-12.5 MG tablet Take 1 tablet by mouth daily. 90 tablet 1  . rivaroxaban (XARELTO) 20 MG TABS tablet Take 1 tablet (20 mg total) by mouth daily with supper. 30 tablet 3  . traZODone (DESYREL) 50 MG tablet Take 0.5 tablets (25 mg total) by mouth at bedtime. (Patient taking differently: Take 25 mg by mouth at bedtime as needed for sleep. ) 30 tablet 3  . metoprolol tartrate (LOPRESSOR) 25 MG tablet Take 1.5 tablets (37.5 mg total)  by mouth 2 (two) times daily. (Patient not taking: Reported on 05/12/2019) 270 tablet 3   Facility-Administered Medications Prior to Visit  Medication Dose Route Frequency Provider Last Rate Last Admin  . 0.9 %  sodium chloride infusion  500 mL Intravenous Continuous Danis, Kirke Corin, MD         No Known Allergies  Social History   Tobacco Use  . Smoking status: Former Smoker    Packs/day: 2.00    Years: 20.00    Pack years: 40.00    Types: Cigarettes    Quit date: 2004    Years since quitting: 17.2  . Smokeless tobacco: Never Used  . Tobacco comment: 20 years ago  Substance Use Topics  . Alcohol use: Yes    Alcohol/week: 6.0 standard drinks    Types: 6 Cans of beer per week  . Drug use: Yes    Frequency: 0.5 times per week    Types: Marijuana    Family History  Problem Relation Age of Onset  . Cancer Mother        uterine vs ovarian?  . Seizures Father   . Mental illness Brother   . Breast cancer Maternal Aunt   . Diabetes Daughter   . Colon polyps Neg Hx   . Esophageal cancer Neg Hx   . Rectal cancer Neg Hx   . Stomach cancer Neg Hx   . Thyroid disease Neg Hx     Social History   Substance and Sexual Activity  Sexual Activity Yes  . Partners: Male  . Birth control/protection: Condom   Comment: accepted condoms     Objective:   Vitals:   05/19/19 0959  BP: 127/86  Pulse: 76  Temp: 98.4 F (36.9 C)  TempSrc: Oral  Weight: 166 lb (75.3 kg)   Body mass index is 26 kg/m.  Physical Exam Vitals reviewed.  Constitutional:      Appearance: She is well-developed.     Comments: Seated comfortably in chair.   HENT:     Mouth/Throat:     Mouth: No oral lesions.     Dentition: Normal dentition. No dental abscesses.     Pharynx: No oropharyngeal exudate.  Cardiovascular:     Rate and Rhythm: Normal rate and regular rhythm.     Heart sounds: Normal heart sounds.  Pulmonary:     Effort: Pulmonary effort is normal.     Breath sounds: Normal breath  sounds.  Abdominal:     General: There is no distension.     Palpations: Abdomen is soft.     Tenderness: There is no abdominal tenderness.  Lymphadenopathy:     Cervical: No cervical adenopathy.  Skin:    General: Skin is warm and dry.     Findings: Rash present.     Comments: Macular hyperpigmented flat rash overlying cheeks bilaterally and forehead.   Neurological:  Mental Status: She is alert and oriented to person, place, and time.  Psychiatric:        Judgment: Judgment normal.     Comments: In good spirits today and engaged in care discussion     Lab Results Lab Results  Component Value Date   WBC 3.5 (L) 05/12/2019   HGB 14.2 05/12/2019   HCT 43.3 05/12/2019   MCV 101.6 (H) 05/12/2019   PLT 197 05/12/2019    Lab Results  Component Value Date   CREATININE 1.01 (H) 05/12/2019   BUN 10 05/12/2019   NA 139 05/12/2019   K 3.7 05/12/2019   CL 104 05/12/2019   CO2 27 05/12/2019    Lab Results  Component Value Date   ALT 27 05/12/2019   AST 36 05/12/2019   ALKPHOS 55 05/12/2019   BILITOT 0.6 05/12/2019    Lab Results  Component Value Date   CHOL 227 (H) 04/13/2019   HDL 92 04/13/2019   LDLCALC 121 (H) 04/13/2019   TRIG 57 04/13/2019   CHOLHDL 2.5 04/13/2019   HIV 1 RNA Quant (copies/mL)  Date Value  04/13/2019 <20 NOT DETECTED  03/31/2018 30 (H)  07/14/2017 <20 DETECTED (A)   CD4 T Cell Abs (/uL)  Date Value  04/13/2019 907  03/31/2018 780  07/14/2017 860     Assessment & Plan:   Problem List Items Addressed This Visit      Unprioritized   Hyperthyroidism    I don't think she has had levels in some time - will check today. Not currently on any maintenance medications for this. Previously has seen Dr. Loanne Drilling in 2019. If abnormal, given her heart history, discussed she should see Dr. Loanne Drilling back to consider medications.   ADDENDUM: 05/22/2019  Lab Results  Component Value Date   TSH 0.36 (L) 05/19/2019   T4TOTAL 6.4 05/19/2019    Referral back to Dr. Loanne Drilling made.       Relevant Orders   Ambulatory referral to Endocrinology   Human immunodeficiency virus (HIV) disease ([place]) (Chronic)    Continues to have excellent control over HIV with VL < 20 and CD4 900 cells. Will continue her Biktarvy.  Vaccines up to date - she will consider the COVID19 vaccine  Return in about 9 months (around 02/07/2020) for follow up. for routine visit and pap smear. Can do labs at the visit.       Essential hypertension - Primary    BP Readings from Last 3 Encounters:  05/19/19 127/86  05/12/19 138/90  05/08/19 138/82   Well controlled on current regimen. Follow up with Cards soon.       Relevant Orders   Thyroid Panel With TSH (Completed)   Hemoglobin A1c (Completed)   Atrial fibrillation ([facility])    Irregular HR today but most likely premature beats. She has not had follow up in a while - I re-entered referral today to coordinate follow up as I am not clear on her plans for Xarelto.       Relevant Orders   Ambulatory referral to Cardiology     Still has not established with PCP - will try again for management of multiple chronic medical conditions.    Janene Madeira, MSN, NP-C [facility] for [facility] [place] Pager: [phone] Office: [phone]  05/22/19  12:06 PM

## 2019-05-20 LAB — HEMOGLOBIN A1C
Hgb A1c MFr Bld: 5.3 % of total Hgb (ref <5.7)
Mean Plasma Glucose: 105 (calc)
eAG (mmol/L): 5.8 (calc)

## 2019-05-20 LAB — THYROID PANEL WITH TSH
Free Thyroxine Index: 2 (ref 1.4–3.8)
T3 Uptake: 31 % (ref 22–35)
T4, Total: 6.4 ug/dL (ref 5.1–11.9)
TSH: 0.36 mIU/L — ABNORMAL LOW (ref 0.40–4.50)

## 2019-05-22 ENCOUNTER — Encounter: Payer: Self-pay | Admitting: Infectious Diseases

## 2019-05-22 NOTE — Assessment & Plan Note (Signed)
BP Readings from Last 3 Encounters:  05/19/19 127/86  05/12/19 138/90  05/08/19 138/82   Well controlled on current regimen. Follow up with Cards soon.

## 2019-05-22 NOTE — Progress Notes (Signed)
Thanks Stephanie

## 2019-05-22 NOTE — Assessment & Plan Note (Signed)
Irregular HR today but most likely premature beats. She has not had follow up in a while - I re-entered referral today to coordinate follow up as I am not clear on her plans for Xarelto.

## 2019-05-22 NOTE — Progress Notes (Signed)
Please call Vicki Brown to let her know that her thyroid function tests continue to be low. She was previously in care with Dr. Loanne Drilling and I would like for her to make a follow up to see him again at the Endocrinology clinic - [phone] for an appointment. Her diabetes test is negative.  May be helpful to discuss skin changes with Dr. Loanne Drilling as well.

## 2019-05-22 NOTE — Assessment & Plan Note (Addendum)
I don't think she has had levels in some time - will check today. Not currently on any maintenance medications for this. Previously has seen Dr. Loanne Drilling in 2019. If abnormal, given her heart history, discussed she should see Dr. Loanne Drilling back to consider medications.   ADDENDUM: 05/22/2019  Lab Results  Component Value Date   TSH 0.36 (L) 05/19/2019   T4TOTAL 6.4 05/19/2019   Referral back to Dr. Loanne Drilling made.

## 2019-05-22 NOTE — Assessment & Plan Note (Addendum)
Continues to have excellent control over HIV with VL < 20 and CD4 900 cells. Will continue her Biktarvy.  Vaccines up to date - she will consider the COVID19 vaccine  Return in about 9 months (around 02/07/2020) for follow up. for routine visit and pap smear. Can do labs at the visit.

## 2019-05-30 ENCOUNTER — Other Ambulatory Visit: Payer: Self-pay

## 2019-06-01 ENCOUNTER — Ambulatory Visit (INDEPENDENT_AMBULATORY_CARE_PROVIDER_SITE_OTHER): Payer: 59 | Admitting: Endocrinology

## 2019-06-01 ENCOUNTER — Encounter: Payer: Self-pay | Admitting: Endocrinology

## 2019-06-01 ENCOUNTER — Other Ambulatory Visit: Payer: Self-pay

## 2019-06-01 DIAGNOSIS — E042 Nontoxic multinodular goiter: Secondary | ICD-10-CM | POA: Insufficient documentation

## 2019-06-01 NOTE — Patient Instructions (Signed)
let's check a thyroid "scan" (a special, but easy and painless type of thyroid x ray).  It works like this: you go to the x-ray department of the hospital to swallow a pill, which contains a miniscule amount of radiation.  You will not notice any symptoms from this.  You will go back to the x-ray department the next day, to lie down in front of a camera.  The results of this will be sent to me.   Please come back 1-2 days after that.

## 2019-06-01 NOTE — Progress Notes (Signed)
Subjective:    Patient ID: Vicki Brown, female    DOB: Mar 14, 1960, 59 y.o.   MRN: [id number]  HPI  Pt returns for f/u of hyperthyroidism (dx'ed 2019; US showed MNG; she has never been on therapy for this).  pt states she feels well in general, except for slight palpitations.   Past Medical History:  Diagnosis Date  . Allergy   . HIV infection ([place])   . Hyperlipidemia   . Hypertension   . Insomnia 12/19/2014    Past Surgical History:  Procedure Laterality Date  . Broken Arm Left    Pins placed in arm  . TUBAL LIGATION      Social History   Socioeconomic History  . Marital status: Single    Spouse name: Not on file  . Number of children: Not on file  . Years of education: Not on file  . Highest education level: Not on file  Occupational History  . Not on file  Tobacco Use  . Smoking status: Former Smoker    Packs/day: 2.00    Years: 20.00    Pack years: 40.00    Types: Cigarettes    Quit date: 2004    Years since quitting: 17.2  . Smokeless tobacco: Never Used  . Tobacco comment: 20 years ago  Substance and Sexual Activity  . Alcohol use: Yes    Alcohol/week: 6.0 standard drinks    Types: 6 Cans of beer per week  . Drug use: Yes    Frequency: 0.5 times per week    Types: Marijuana  . Sexual activity: Yes    Partners: Male    Birth control/protection: Condom    Comment: accepted condoms  Other Topics Concern  . Not on file  Social History Narrative  . Not on file   Social Determinants of Health   Financial Resource Strain:   . Difficulty of Paying Living Expenses:   Food Insecurity:   . Worried About Charity fundraiser in the Last Year:   . Arboriculturist in the Last Year:   Transportation Needs:   . Film/video editor (Medical):   [name] Lack of Transportation (Non-Medical):   Physical Activity:   . Days of Exercise per Week:   . Minutes of Exercise per Session:   Stress:   . Feeling of Stress :   Social Connections:   . Frequency of  Communication with Friends and Family:   . Frequency of Social Gatherings with Friends and Family:   . Attends Religious Services:   . Active Member of Clubs or Organizations:   . Attends Archivist Meetings:   [name] Marital Status:   Intimate Partner Violence:   . Fear of Current or Ex-Partner:   . Emotionally Abused:   [name] Physically Abused:   . Sexually Abused:     Current Outpatient Medications on File Prior to Visit  Medication Sig Dispense Refill  . acetaminophen (TYLENOL) 500 MG tablet Take 2 tablets (1,000 mg total) by mouth every 8 (eight) hours as needed. (Patient taking differently: Take 1,000 mg by mouth every 8 (eight) hours as needed for mild pain or headache. ) 30 tablet 0  . atorvastatin (LIPITOR) 10 MG tablet Take 1 tablet (10 mg total) by mouth daily. 30 tablet 5  . BIKTARVY 50-200-25 MG TABS tablet TAKE 1 TABLET BY MOUTH DAILY (Patient taking differently: Take 1 tablet by mouth daily. ) 30 tablet 5  . famotidine (PEPCID) 20 MG tablet Take  1 tablet (20 mg total) by mouth 2 (two) times daily. 30 tablet 0  . ibuprofen (ADVIL,MOTRIN) 800 MG tablet TAKE 1 TABLET(800 MG) BY MOUTH TWICE DAILY WITH FOOD AS NEEDED 30 tablet 0  . lisinopril-hydrochlorothiazide (ZESTORETIC) 10-12.5 MG tablet Take 1 tablet by mouth daily. 90 tablet 1  . rivaroxaban (XARELTO) 20 MG TABS tablet Take 1 tablet (20 mg total) by mouth daily with supper. 30 tablet 3  . traZODone (DESYREL) 50 MG tablet Take 0.5 tablets (25 mg total) by mouth at bedtime. (Patient taking differently: Take 25 mg by mouth at bedtime as needed for sleep. ) 30 tablet 3  . metoprolol tartrate (LOPRESSOR) 25 MG tablet Take 1.5 tablets (37.5 mg total) by mouth 2 (two) times daily. (Patient not taking: Reported on 05/12/2019) 270 tablet 3   Current Facility-Administered Medications on File Prior to Visit  Medication Dose Route Frequency Provider Last Rate Last Admin  . 0.9 %  sodium chloride infusion  500 mL Intravenous Continuous  Danis, Estill Cotta III, MD        No Known Allergies  Family History  Problem Relation Age of Onset  . Cancer Mother        uterine vs ovarian?  . Seizures Father   . Mental illness Brother   . Breast cancer Maternal Aunt   . Diabetes Daughter   . Colon polyps Neg Hx   . Esophageal cancer Neg Hx   . Rectal cancer Neg Hx   . Stomach cancer Neg Hx   . Thyroid disease Neg Hx     BP 126/80 (BP Location: Left Arm, Patient Position: Sitting, Cuff Size: Normal)   Pulse (!) 113   Ht 5\' 7"  (1.702 m)   Wt 167 lb 6.4 oz (75.9 kg)   SpO2 99%   BMI 26.22 kg/m   Review of Systems Denies neck swelling.     Objective:   Physical Exam VITAL SIGNS:  See vs page GENERAL: no distress NECK: 2 cm right thyroid is palpable    Lab Results  Component Value Date   TSH 0.36 (L) 05/19/2019   T4TOTAL 6.4 05/19/2019       Assessment & Plan:  MNG is again noted.  Hyperthyroidism: mild.  This is due to the MNG.  AF: in this setting, we should rx even mildly abnormal TFT.   Patient Instructions  let's check a thyroid "scan" (a special, but easy and painless type of thyroid x ray).  It works like this: you go to the x-ray department of the hospital to swallow a pill, which contains a miniscule amount of radiation.  You will not notice any symptoms from this.  You will go back to the x-ray department the next day, to lie down in front of a camera.  The results of this will be sent to me.   Please come back 1-2 days after that.

## 2019-06-06 ENCOUNTER — Other Ambulatory Visit: Payer: Self-pay

## 2019-06-06 ENCOUNTER — Ambulatory Visit
Admission: RE | Admit: 2019-06-06 | Discharge: 2019-06-06 | Disposition: A | Discharge status: Home / Self Care | Payer: 59 | Source: Ambulatory Visit | Attending: Infectious Diseases | Admitting: Infectious Diseases

## 2019-06-06 DIAGNOSIS — Z1231 Encounter for screening mammogram for malignant neoplasm of breast: Secondary | ICD-10-CM

## 2019-06-20 ENCOUNTER — Telehealth: Payer: Self-pay | Admitting: Endocrinology

## 2019-06-20 NOTE — Telephone Encounter (Signed)
Patient requests to be called at ph# [phone] re: Does patient wait to be contacted to have thyroid scan?

## 2019-06-20 NOTE — Telephone Encounter (Signed)
Not sure who is working on this order but whomever is assigned, could you follow up with the pt re: the status of her appt. Thank you

## 2019-06-20 NOTE — Progress Notes (Signed)
Cardiology Office Note:    Date:  06/21/2019   ID:  Vicki Brown, DOB Mar 12, 1960, MRN [id number]  PCP:  Campbell Riches, MD  Cardiologist:  Nelva Bush, MD   Referring MD: Sebastian Callas, NP   Chief Complaint  Patient presents with  . Atrial Fibrillation  . Congestive Heart Failure  . Hypertension    History of Present Illness:    Vicki Brown is a 59 y.o. female with a hx of hypertension, HLD, HIV, paroxysmal atrial fibrillation, Xarelto 20 mg/dsy. Chronic diastolic dysfunction, and with no ischemia LVEF 72% on nuclear study.  Vicki Brown is being met today for the first time.Vicki Brown  He cannot recite any of her past medical problems other than that she has HIV and is on Biktarvy.  She remembers seeing Dr. Saunders Revel.  He documents a history of atrial fibrillation.  Last seen in 2019.  The patient's most recent electrocardiograms were done between March 1 and May 15, 2019.  These tracings all demonstrate normal sinus rhythm.  The patient's med list states that she is on metoprolol tartrate 37.5 mg twice daily.  This medication is no longer being taken by the patient and she cannot tell me why.  She is also not taking famotidine or atorvastatin.  She continues to use Xarelto and Zestoretic 10/12.5 mg/day.  She has no cardiopulmonary complaints.  She denies palpitations.  Has never had syncope.  Denies chest pain, orthopnea, PND, lower extremity swelling, and claudication.  She does not smoke cigarettes.  Past Medical History:  Diagnosis Date  . Allergy   . HIV infection ([place])   . Hyperlipidemia   . Hypertension   . Insomnia 12/19/2014    Past Surgical History:  Procedure Laterality Date  . Broken Arm Left    Pins placed in arm  . TUBAL LIGATION      Current Medications: Current Meds  Medication Sig  . acetaminophen (TYLENOL) 500 MG tablet Take 2 tablets (1,000 mg total) by mouth every 8 (eight) hours as needed.  Vicki Brown BIKTARVY 50-200-25 MG TABS tablet TAKE 1 TABLET BY  MOUTH DAILY  . ibuprofen (ADVIL,MOTRIN) 800 MG tablet TAKE 1 TABLET(800 MG) BY MOUTH TWICE DAILY WITH FOOD AS NEEDED  . lisinopril-hydrochlorothiazide (ZESTORETIC) 10-12.5 MG tablet Take 1 tablet by mouth daily.  . rivaroxaban (XARELTO) 20 MG TABS tablet Take 1 tablet (20 mg total) by mouth daily with supper.  . [DISCONTINUED] rivaroxaban (XARELTO) 20 MG TABS tablet Take 1 tablet (20 mg total) by mouth daily with supper.   Current Facility-Administered Medications for the 06/21/19 encounter (Office Visit) with Belva Crome, MD  Medication  . 0.9 %  sodium chloride infusion     Allergies:   Patient has no known allergies.   Social History   Socioeconomic History  . Marital status: Single    Spouse name: Not on file  . Number of children: Not on file  . Years of education: Not on file  . Highest education level: Not on file  Occupational History  . Not on file  Tobacco Use  . Smoking status: Former Smoker    Packs/day: 2.00    Years: 20.00    Pack years: 40.00    Types: Cigarettes    Quit date: 2004    Years since quitting: 17.2  . Smokeless tobacco: Never Used  . Tobacco comment: 20 years ago  Substance and Sexual Activity  . Alcohol use: Yes    Alcohol/week: 6.0 standard drinks  Types: 6 Cans of beer per week  . Drug use: Yes    Frequency: 0.5 times per week    Types: Marijuana  . Sexual activity: Yes    Partners: Male    Birth control/protection: Condom    Comment: accepted condoms  Other Topics Concern  . Not on file  Social History Narrative  . Not on file   Social Determinants of Health   Financial Resource Strain:   . Difficulty of Paying Living Expenses:   Food Insecurity:   . Worried About Charity fundraiser in the Last Year:   . Arboriculturist in the Last Year:   Transportation Needs:   . Film/video editor (Medical):   Vicki Brown Lack of Transportation (Non-Medical):   Physical Activity:   . Days of Exercise per Week:   . Minutes of Exercise per  Session:   Stress:   . Feeling of Stress :   Social Connections:   . Frequency of Communication with Friends and Family:   . Frequency of Social Gatherings with Friends and Family:   . Attends Religious Services:   . Active Member of Clubs or Organizations:   . Attends Archivist Meetings:   Vicki Brown Marital Status:      Family History: The patient's family history includes Breast cancer in her maternal aunt; Cancer in her mother; Diabetes in her daughter; Mental illness in her brother; Seizures in her father. There is no history of Colon polyps, Esophageal cancer, Rectal cancer, Stomach cancer, or Thyroid disease.  ROS:   Please see the history of present illness.    She has easy fatigue.  She denies edema.  She sees Dr. Loanne Drilling thyroid disease.  She continues Dr. Loanne Drilling to be her primary physician.Vicki Brown  He is managing all other systems reviewed and are negative.  EKGs/Labs/Other Studies Reviewed:    The following studies were reviewed today: Echocardiography in prior evaluation from cardiac standpoint is remote, and greater than 2 years ago.  EKG:  EKG atrial fibrillation, moderate rate control, otherwise normal.  When compared with the prior tracing from March 2021, A. fib is new.  Rate is faster.  Recent Labs: 05/12/2019: ALT 27; BUN 10; Creatinine, Ser 1.01; Hemoglobin 14.2; Platelets 197; Potassium 3.7; Sodium 139 05/19/2019: TSH 0.36  Recent Lipid Panel    Component Value Date/Time   CHOL 227 (H) 04/13/2019 0914   TRIG 57 04/13/2019 0914   HDL 92 04/13/2019 0914   CHOLHDL 2.5 04/13/2019 0914   VLDL 13 04/22/2016 1344   LDLCALC 121 (H) 04/13/2019 0914    Physical Exam:    VS:  BP 128/82   Pulse (!) 109   Ht '5\' 7"'  (1.702 m)   Wt 170 lb 6.4 oz (77.3 kg)   SpO2 96%   BMI 26.69 kg/m     Wt Readings from Last 3 Encounters:  06/21/19 170 lb 6.4 oz (77.3 kg)  06/01/19 167 lb 6.4 oz (75.9 kg)  05/19/19 166 lb (75.3 kg)     GEN: African-American female who appears  older than stated age. No acute distress HEENT: Normal NECK: No JVD. LYMPHATICS: No lymphadenopathy CARDIAC: Irregularly irregular RR without murmur, gallop, or edema. VASCULAR:  Normal Pulses. No bruits. RESPIRATORY:  Clear to auscultation without rales, wheezing or rhonchi  ABDOMEN: Soft, non-tender, non-distended, No pulsatile mass, MUSCULOSKELETAL: No deformity  SKIN: Warm and dry NEUROLOGIC:  Alert and oriented x 3 PSYCHIATRIC:  Normal affect   ASSESSMENT:    1.  Persistent atrial fibrillation ([place])   2. Essential hypertension   3. Hyperlipidemia, unspecified hyperlipidemia type   4. Other chest pain   5. Shortness of breath   6. Educated about COVID-19 virus infection    PLAN:    In order of problems listed above:  1. Atrial fibrillation is present today.  It was not present a month ago.  She therefore likely has paroxysmal atrial fibrillation.  Her Chads Vasc is > 2 (female, hypertension).  Continue Xarelto 20 mg/day.  7-day monitor to determine if she has paroxysmal atrial fibrillation or continuous.  We will also help to determine if rate control is good.  Resume metoprolol succinate 25 mg/day and may need to increase the dose depending upon monitor findings. 2. Blood pressure diastolic is mildly elevated.  Adding metoprolol succinate may help 3. LDL cholesterol elevated in February at 121.  Will likely need to resume statin therapy.  I believe primary care has been managing this.  I will follow up when I see her next. 4. No complaint of chest pain today. 5. May be related to LV dysfunction, atrial fibrillation, or coronary disease.  Once we get the 7-day monitor and echo, further evaluation may be necessary.  Probably needs a BNP on her next office visit. 6. Has received the COVID-19 vaccine.  Social distancing and handwashing is being practiced.  She wears a mask.  The natural history of atrial fibrillation was discussed.  The inability to cure and the possibility of  recurrences was clearly stated.  Management strategies including rhythm control (antiarrhythmic therapy), rate control (beta-blocker therapy or AV node blocking calcium channel blocker therapy), and ablation and/or pacemaker therapy were reviewed.  Stroke risk (as determined by CHADS VASC score >1) was discussed relative to the patient's individual profile.  The expected duration/permanence of anti-coagulation therapy was determined based on the individual risk score.  Coumadin versus NOAC therapy was reviewed, highlighting the lower bleeding risk and improved safety with NOAC therapy.    4-week follow-up for further evaluation of shortness of breath, possibility of coronary disease, etc. will depend upon above order database.  Medication Adjustments/Labs and Tests Ordered: Current medicines are reviewed at length with the patient today.  Concerns regarding medicines are outlined above.  Orders Placed This Encounter  Procedures  . LONG TERM MONITOR (3-14 DAYS)  . EKG 12-Lead  . ECHOCARDIOGRAM COMPLETE   Meds ordered this encounter  Medications  . metoprolol succinate (TOPROL XL) 25 MG 24 hr tablet    Sig: Take 1 tablet (25 mg total) by mouth daily.    Dispense:  90 tablet    Refill:  3    D/c Metoprolol Tartrate  . rivaroxaban (XARELTO) 20 MG TABS tablet    Sig: Take 1 tablet (20 mg total) by mouth daily with supper.    Dispense:  30 tablet    Refill:  11    Patient Instructions  Medication Instructions:  1) START Metoprolol Succinate 71m once daily  *If you need a refill on your cardiac medications before your next appointment, please call your pharmacy*   Lab Work: None If you have labs (blood work) drawn today and your tests are completely normal, you will receive your results only by: .Vicki KitchenMyChart Message (if you have MyChart) OR . A paper copy in the mail If you have any lab test that is abnormal or we need to change your treatment, we will call you to review the  results.   Testing/Procedures: Your physician has  requested that you have an echocardiogram. Echocardiography is a painless test that uses sound waves to create images of your heart. It provides your doctor with information about the size and shape of your heart and how well your heart's chambers and valves are working. This procedure takes approximately one hour. There are no restrictions for this procedure.  Your physician recommends that you wear a monitor for 7 days.  Follow-Up: At [facility], you and your health needs are our priority.  As part of our continuing mission to provide you with exceptional heart care, we have created designated Provider Care Teams.  These Care Teams include your primary Cardiologist (physician) and Advanced Practice Providers (APPs -  Physician Assistants and Nurse Practitioners) who all work together to provide you with the care you need, when you need it.  We recommend signing up for the patient portal called "MyChart".  Sign up information is provided on this After Visit Summary.  MyChart is used to connect with patients for Virtual Visits (Telemedicine).  Patients are able to view lab/test results, encounter notes, upcoming appointments, etc.  Non-urgent messages can be sent to your provider as well.   To learn more about what you can do with MyChart, go to [email or web address].    Your next appointment:   1 month(s)  The format for your next appointment:   In Person  Provider:   You may see Daneen Schick, III, MD or one of the following Advanced Practice Providers on your designated Care Team:    Truitt Merle, NP  Cecilie Kicks, NP  Kathyrn Drown, NP    Other Instructions      Signed, Sinclair Grooms, MD  06/21/2019 10:40 AM    [place]

## 2019-06-20 NOTE — Telephone Encounter (Signed)
Patient has an appointment on 07-11-19 at 830 am and 1 pm and then on 07-12-19 at 830 am.  Patient has been notified and instructed NPO after midnight.

## 2019-06-21 ENCOUNTER — Other Ambulatory Visit: Payer: Self-pay

## 2019-06-21 ENCOUNTER — Encounter: Payer: Self-pay | Admitting: Interventional Cardiology

## 2019-06-21 ENCOUNTER — Ambulatory Visit: Payer: 59

## 2019-06-21 ENCOUNTER — Ambulatory Visit (INDEPENDENT_AMBULATORY_CARE_PROVIDER_SITE_OTHER): Payer: 59 | Admitting: Interventional Cardiology

## 2019-06-21 ENCOUNTER — Telehealth: Payer: Self-pay

## 2019-06-21 VITALS — BP 128/82 | HR 109 | Ht 67.0 in | Wt 170.4 lb

## 2019-06-21 DIAGNOSIS — I10 Essential (primary) hypertension: Secondary | ICD-10-CM | POA: Diagnosis not present

## 2019-06-21 DIAGNOSIS — I4819 Other persistent atrial fibrillation: Secondary | ICD-10-CM | POA: Diagnosis not present

## 2019-06-21 DIAGNOSIS — E785 Hyperlipidemia, unspecified: Secondary | ICD-10-CM

## 2019-06-21 DIAGNOSIS — R0789 Other chest pain: Secondary | ICD-10-CM

## 2019-06-21 DIAGNOSIS — Z7189 Other specified counseling: Secondary | ICD-10-CM

## 2019-06-21 DIAGNOSIS — R0602 Shortness of breath: Secondary | ICD-10-CM

## 2019-06-21 MED ORDER — RIVAROXABAN 20 MG PO TABS: 20.0000 mg | ORAL_TABLET | Freq: Every day | ORAL | 11 refills | Status: DC

## 2019-06-21 MED ORDER — METOPROLOL SUCCINATE ER 25 MG PO TB24: 25.0000 mg | ORAL_TABLET | Freq: Every day | ORAL | 3 refills | Status: DC

## 2019-06-21 NOTE — Patient Instructions (Addendum)
Medication Instructions:  1) START Metoprolol Succinate 25mg  once daily  *If you need a refill on your cardiac medications before your next appointment, please call your pharmacy*   Lab Work: None If you have labs (blood work) drawn today and your tests are completely normal, you will receive your results only by: [name] MyChart Message (if you have MyChart) OR . A paper copy in the mail If you have any lab test that is abnormal or we need to change your treatment, we will call you to review the results.   Testing/Procedures: Your physician has requested that you have an echocardiogram. Echocardiography is a painless test that uses sound waves to create images of your heart. It provides your doctor with information about the size and shape of your heart and how well your heart's chambers and valves are working. This procedure takes approximately one hour. There are no restrictions for this procedure.  Your physician recommends that you wear a monitor for 7 days.  Follow-Up: At [facility], you and your health needs are our priority.  As part of our continuing mission to provide you with exceptional heart care, we have created designated Provider Care Teams.  These Care Teams include your primary Cardiologist (physician) and Advanced Practice Providers (APPs -  Physician Assistants and Nurse Practitioners) who all work together to provide you with the care you need, when you need it.  We recommend signing up for the patient portal called "MyChart".  Sign up information is provided on this After Visit Summary.  MyChart is used to connect with patients for Virtual Visits (Telemedicine).  Patients are able to view lab/test results, encounter notes, upcoming appointments, etc.  Non-urgent messages can be sent to your provider as well.   To learn more about what you can do with MyChart, go to [email or web address].    Your next appointment:   1 month(s) (can add to end of clinic on May 13)  The  format for your next appointment:   In Person  Provider:   You may see Daneen Schick, III, MD or one of the following Advanced Practice Providers on your designated Care Team:    Truitt Merle, NP  Cecilie Kicks, NP  Kathyrn Drown, NP    Other Instructions

## 2019-06-21 NOTE — Telephone Encounter (Signed)
Pt called stating that she went to the pharmacy to pick up her Xarelto and her OOP expense was $200. Her insurance pd $600. She said she didn't get it because she can't afford that. I gave her 1 mth supply of samples and Pt Asst paperwork. I spoke to her when she came to pick it up and explained to her how to fill out her portion of the paperwork. I told her as soon as she got her portion completed to bring it back and we would get our part completed, signed and faxed in for her.

## 2019-06-22 ENCOUNTER — Encounter: Payer: Self-pay | Admitting: Infectious Diseases

## 2019-07-03 ENCOUNTER — Telehealth: Payer: Self-pay | Admitting: Interventional Cardiology

## 2019-07-03 NOTE — Telephone Encounter (Signed)
Pt dropped off pt assistance forms for Xarelto last week.  Paperwork completed.  Faxed to [employer] today after Dr. Tamala Julian signed.  Copy placed at desk of Prior Auth nurse and pt's copy placed in mail as requested.

## 2019-07-05 ENCOUNTER — Other Ambulatory Visit: Payer: Self-pay

## 2019-07-05 ENCOUNTER — Ambulatory Visit (HOSPITAL_COMMUNITY): Payer: 59 | Attending: Cardiovascular Disease

## 2019-07-05 DIAGNOSIS — I4819 Other persistent atrial fibrillation: Secondary | ICD-10-CM | POA: Insufficient documentation

## 2019-07-11 ENCOUNTER — Encounter (HOSPITAL_COMMUNITY): Payer: Self-pay | Admitting: Emergency Medicine

## 2019-07-11 ENCOUNTER — Other Ambulatory Visit: Payer: Self-pay

## 2019-07-11 ENCOUNTER — Emergency Department (HOSPITAL_COMMUNITY)
Admission: EM | Admit: 2019-07-11 | Discharge: 2019-07-11 | Discharge status: Against Medical Advice | Payer: 59 | Attending: Emergency Medicine | Admitting: Emergency Medicine

## 2019-07-11 ENCOUNTER — Encounter (HOSPITAL_COMMUNITY): Payer: 59

## 2019-07-11 DIAGNOSIS — E079 Disorder of thyroid, unspecified: Secondary | ICD-10-CM | POA: Diagnosis not present

## 2019-07-11 DIAGNOSIS — Z5321 Procedure and treatment not carried out due to patient leaving prior to being seen by health care provider: Secondary | ICD-10-CM | POA: Insufficient documentation

## 2019-07-11 NOTE — ED Notes (Signed)
Pt found to not be in room. Bathroom and hall checked. Pt believed to have eloped w/o notifying staff.

## 2019-07-11 NOTE — ED Provider Notes (Signed)
Pt was not in the room for eval. I did not establish care with her.    Bishop Dublin 07/11/19 1848    Maudie Flakes, MD 07/12/19 [phone]

## 2019-07-11 NOTE — ED Triage Notes (Addendum)
Pt reports she saw her cardiologist about 1 month ago and was told her thyroid was abnormal and she needed to come to the ED for medication for it? Pt denies any history of thyroid issues, pt is poor historian, unable to provide pertinent history to why she was sent to ED.

## 2019-07-12 ENCOUNTER — Encounter (HOSPITAL_COMMUNITY): Payer: 59

## 2019-07-20 ENCOUNTER — Ambulatory Visit: Payer: 59 | Admitting: Interventional Cardiology

## 2019-07-30 NOTE — Progress Notes (Signed)
Cardiology Office Note   Date:  08/09/2019   ID:  [name], [name] [birth year], MRN [id number]  PCP:  Vicki Riches, MD  Cardiologist: Dr. Daneen Schick, MD  Chief Complaint  Patient presents with  . Atrial Fibrillation    History of Present Illness: Vicki [name] is a 59 y.o. female who presents for follow-up, seen for Dr. Tamala [name].  Vicki [name], Vicki [name], Vicki [name], Vicki atrial fibrillation on Xarelto and chronic diastolic dysfunction with no ischemia on nuclear stress testing.  She was most recently seen by Dr. Tamala [name] 06/21/2019 in initial consultation.  At that time, she was unable to determine definitive past medical history however notes revealed by Dr. Saunders [name] history of atrial fibrillation, last seen in 2019.  She had EKGs from as early as 05/2019 which demonstrated normal sinus rhythm.  Medication list revealed she was taking metoprolol tartrate 37.5 mg twice daily however patient self discontinued for unknown reasons.  Also was not taking famotidine or atorvastatin however was consistent with Xarelto and Zestoretic 10/12.5.   She was asymptomatic at her last office visit however was in atrial fibrillation.   Today, she states that she has had no palpitations or any other symptoms since last seen.  She is in normal sinus rhythm per auscultation today.  Rate is controlled.  Has been compliant with Xarelto.  She is inquiring about patient assistance plan for which she turned in her paperwork for 2021.  Contacted our team and patient was provided [employer] contact information for further follow-up.  Messages have been sent.  She was provided 2-week supplement supply.  She denies chest pain, palpitations, shortness of breath, LE edema, dizziness or syncope.  Past Medical History:  Diagnosis Date  . Allergy   . Vicki [name] infection (Vicki [name])   . Vicki [name]   . [name]   . Insomnia 12/19/2014    Past Surgical History:  Procedure  Laterality Date  . Broken Arm Left    Pins placed in arm  . TUBAL LIGATION       Current Outpatient Medications  Medication Sig Dispense Refill  . acetaminophen (TYLENOL) 500 MG tablet Take 2 tablets (1,000 mg total) by mouth every 8 (eight) hours as needed. 30 tablet 0  . BIKTARVY 50-200-25 MG TABS tablet TAKE 1 TABLET BY MOUTH DAILY 30 tablet 5  . ibuprofen (ADVIL,MOTRIN) 800 MG tablet TAKE 1 TABLET(800 MG) BY MOUTH TWICE DAILY WITH FOOD AS NEEDED 30 tablet 0  . lisinopril-hydrochlorothiazide (ZESTORETIC) 10-12.5 MG tablet Take 1 tablet by mouth daily. 90 tablet 1  . metoprolol succinate (TOPROL XL) 25 MG 24 hr tablet Take 1 tablet (25 mg total) by mouth daily. 90 tablet 3  . rivaroxaban (XARELTO) 20 MG TABS tablet Take 1 tablet (20 mg total) by mouth daily with supper. 30 tablet 11   Current Facility-Administered Medications  Medication Dose Route Frequency Provider Last Rate Last Admin  . 0.9 %  sodium chloride infusion  500 mL Intravenous Continuous Danis, Kirke Corin, MD        Allergies:   Patient has no known allergies.    Social History:  The patient  reports that she quit smoking about 17 years ago. Her smoking use included cigarettes. She has a 40.00 pack-year smoking history. She has never used smokeless tobacco. She reports current alcohol use of about 6.0 standard drinks of alcohol per week. She reports current drug use. Frequency: 0.50 times per week. Drug: Marijuana.  Family History:  The patient's family history includes Breast cancer in her maternal aunt; Cancer in her mother; Diabetes in her daughter; Mental illness in her brother; Seizures in her father.    ROS:  Please see the history of present illness.   Otherwise, review of systems are positive for none.   All other systems are reviewed and negative.    PHYSICAL EXAM: VS:  BP 110/66   Pulse 80   Ht 5\' 6"  (1.676 m)   Wt 168 lb 12.8 oz (76.6 kg)   SpO2 99%   BMI 27.25 kg/m  , BMI Body mass index is 27.25  kg/m.  Acute  General: Well developed, well nourished, NAD Neck: Negative for carotid bruits. No JVD Lungs:Clear to ausculation bilaterally. No wheezes, rales, or rhonchi. Breathing is unlabored. Cardiovascular: RRR with S1 S2. No murmurs Extremities: No edema. Radial pulses 2+ bilaterally Neuro: Alert and oriented. No focal deficits. No facial asymmetry. MAE spontaneously. Psych: Responds to questions appropriately with normal affect.     EKG:  EKG is not ordered today.  Recent Labs: 05/12/2019: ALT 27; BUN 10; Creatinine, Ser 1.01; Hemoglobin 14.2; Platelets 197; Potassium 3.7; Sodium 139 05/19/2019: TSH 0.36    Lipid Panel    Component Value Date/Time   CHOL 227 (H) 04/13/2019 0914   TRIG 57 04/13/2019 0914   HDL 92 04/13/2019 0914   CHOLHDL 2.5 04/13/2019 0914   VLDL 13 04/22/2016 1344   LDLCALC 121 (H) 04/13/2019 0914      Wt Readings from Last 3 Encounters:  08/09/19 168 lb 12.8 oz (76.6 kg)  07/11/19 165 lb (74.8 kg)  06/21/19 170 lb 6.4 oz (77.3 kg)     Other studies Reviewed: Additional studies/ records that were reviewed today include:   Echocardiogram 07/05/2019:  1. Left ventricular ejection fraction, by estimation, is 60 to 65%. The  left ventricle has normal function. The left ventricle has no regional  wall motion abnormalities. Left ventricular diastolic parameters were  normal.  2. Right ventricular systolic function is normal. The right ventricular  size is normal. There is normal pulmonary artery systolic pressure.  3. The mitral valve is normal in structure. Trivial mitral valve  regurgitation. No evidence of mitral stenosis.  4. The aortic valve is tricuspid. Aortic valve regurgitation is trivial.  Mild aortic valve sclerosis is present, with no evidence of aortic valve  stenosis.  5. The inferior vena cava is normal in size with greater than 50%  respiratory variability, suggesting right atrial pressure of 3 mmHg.   ASSESSMENT AND  PLAN:  1.  Vicki atrial fibrillation: -NSR today per ascultation>>>HR 70-80 -Continue Toprol-XL 25 mg p.o. daily>> may titrate if not at optimal control -Continue Xarelto>>2 week supply given while waiting for pt assistance  -Reports compliance -CHA2DS2VASc 2 (female, [name])  2.  [name]: -Stable, 110/66 -Toprol-XL 25 mg added to regimen at last OV  3.  HLD: -Last LDL 04/2019, 121 -Statin therapy resumed at last OV  4.  Shortness of breath: -Outpatient monitor>>> not yet obtained -Echocardiogram performed 07/05/2019 which was found to be normal with normal LV function and no valvular or wall motion abnormalities -Denies recurrence   Current medicines are reviewed at length with the patient today.  The patient does not have concerns regarding medicines.  The following changes have been made:  no change  Labs/ tests ordered today include: None  No orders of the defined types were placed in this encounter.  Disposition:   FU with Dr.  Smith in 6 months  Signed, Kathyrn Drown, NP  08/09/2019 9:11 AM    [facility] [address], [place], [place]  [address] Phone: [phone]; Fax: [phone]

## 2019-08-09 ENCOUNTER — Other Ambulatory Visit: Payer: Self-pay

## 2019-08-09 ENCOUNTER — Telehealth: Payer: Self-pay

## 2019-08-09 ENCOUNTER — Encounter: Payer: Self-pay | Admitting: Cardiology

## 2019-08-09 ENCOUNTER — Ambulatory Visit (INDEPENDENT_AMBULATORY_CARE_PROVIDER_SITE_OTHER): Payer: Self-pay | Admitting: Cardiology

## 2019-08-09 VITALS — BP 110/66 | HR 80 | Ht 66.0 in | Wt 168.8 lb

## 2019-08-09 DIAGNOSIS — I10 Essential (primary) hypertension: Secondary | ICD-10-CM

## 2019-08-09 DIAGNOSIS — I4891 Unspecified atrial fibrillation: Secondary | ICD-10-CM

## 2019-08-09 DIAGNOSIS — E785 Hyperlipidemia, unspecified: Secondary | ICD-10-CM

## 2019-08-09 NOTE — Patient Instructions (Signed)
Medication Instructions:   Your physician recommends that you continue on your current medications as directed. Please refer to the Current Medication list given to you today.  *If you need a refill on your cardiac medications before your next appointment, please call your pharmacy*  Lab Work:  None ordered today  Testing/Procedures:  None ordered today  Follow-Up:  On 02/09/20 at 9:40AM with Daneen Schick, MD

## 2019-08-09 NOTE — Telephone Encounter (Addendum)
Vicki Hough, LPN, pt came in office today to see [place], NP, and needed Xarelto samples, because pt was out of medication and pt has already applied for pt assistance in April and has not heard anything from [place] and [place]. I gave her the number to call as well and 2 bottles of Xarelto 20 mg tablets samples. Can you please check on this matter? Thanks

## 2019-08-09 NOTE — Telephone Encounter (Signed)
**Note De-identified Vicki Brown Obfuscation** Thank you Cathy.

## 2019-08-14 ENCOUNTER — Telehealth: Payer: Self-pay | Admitting: Interventional Cardiology

## 2019-08-14 NOTE — Telephone Encounter (Signed)
**Note De-Identified [name] Obfuscation** I called Vicki Brown and [employer] pt asst program and s/w Suanne Marker concerning the pts application. Per Suanne Marker [facility] sent the pt a letter advising that they need her proof of income from 2020 as they did receive her 2020 J4N with the application but they no longer accept W2s as proof of income.  Suanne Marker states that they advised in the letter that if the pt will check the box on question 4 of the application (Patient Declaration) which gives [facility] permission to do a soft check on the pts finances they can finish the approval/denial process. Suanne Marker states that they have all they need from Dr Tamala Julian.  I called the pt and made her aware. She gives verbal permission for [place] to check the box on question 4 which gives [employer] permission to do a financial check on her. She states that she has 5 days of Xarelto on hand.  She is aware that if we still have her application we will check the box on question 4 and fax it to [employer] pt asst program but if not we will leave her another [employer] application for her to complete when she comes to the office to pick up samples tomorrow.

## 2019-08-14 NOTE — Telephone Encounter (Signed)
Patient is calling about paperwork for her XARELTO. She states that [employer] told her the top page is missing.

## 2019-08-15 NOTE — Telephone Encounter (Signed)
Unable to locate previous application.  New application and samples placed downstairs.

## 2019-08-15 NOTE — Telephone Encounter (Signed)
**Note De-Identified Bernadene Garside Obfuscation** Pt is advised that we no longer have her [employer] pt asst application for Xarelto and that we have left her another [employer] application as well as Xarelto samples at our Covid screening table in the downstairs lobby of Dr Darliss Ridgel office in [place] for her to pick up.  She is aware to complete the application while at the office today and to leave it with the staff at our screening table.  She thanked me for our help with this.

## 2019-08-25 NOTE — Telephone Encounter (Signed)
**Note De-Identified Darnelle Derrick Obfuscation** The pt left another Wynetta Emery and [facility] pt asst application at the office for her Xarelto. She did not complete the form so I called but got no answer so I left a message asking the pt to call me back concerning her [employer] pt asst application.  She did call back and provided her household income and household size.  She is aware that Dr Tamala Julian will be in the office on Monday 6/21 and will sign her application then and that we will fax to [facility] pt asst program.  I have asked her to call [employer] Pt Asst program every other day to check the progress of her application starting Wednesday 6/23. She verbalized understanding.

## 2019-08-25 NOTE — Telephone Encounter (Signed)
Follow Up:     Pt is returning your call. 

## 2019-08-25 NOTE — Telephone Encounter (Signed)
**Note De-Identified Laddie Naeem Obfuscation** I have completed the provider part of the pts [employer] pt asst application and scanned/emailed to Dr Darliss Ridgel nurse so she can obtain his signature, date it and fax to [employer] pt asst program at the fax number written on the cover letter included.

## 2019-08-28 NOTE — Telephone Encounter (Signed)
Paperwork signed by Dr. Tamala Julian and faxed to [employer].

## 2019-10-05 NOTE — Telephone Encounter (Addendum)
**Note De-Identified Vicki Brown Obfuscation** Letter received Vicki Brown fax from [employer] [facility] stating that they need the pts insurance info to complete the pt asst process. I called the pt to offer to fax in her [employer] ins info that we have scanned into her chart to [employer].  She states that she no longer has that ins plan because it did not cover much of the cost of her medications and was expensive at $180 a month. She states that she has switched over to the "Silver plan" which is going to be much better for her and states that her Xarelto is covered and that her cost will be affordable.  She states that she has just made this switch in ins plans and she was advised that it will take 7 to 10 days for her Xarelto to arrive in the mail and that she is currently out ot [place]. I advised her that we will leave ger 2 bottles of Xarelto 20 mg samples in the front office of Dr Darliss Ridgel office for hert pick up.  She thanked me for calling her and for all of our help.

## 2019-10-05 NOTE — Telephone Encounter (Signed)
Will do, thanks

## 2019-10-17 ENCOUNTER — Telehealth: Payer: Self-pay

## 2019-10-17 ENCOUNTER — Other Ambulatory Visit: Payer: Self-pay

## 2019-10-17 DIAGNOSIS — Z79899 Other long term (current) drug therapy: Secondary | ICD-10-CM

## 2019-10-17 DIAGNOSIS — Z113 Encounter for screening for infections with a predominantly sexual mode of transmission: Secondary | ICD-10-CM

## 2019-10-17 DIAGNOSIS — B20 Human immunodeficiency virus [HIV] disease: Secondary | ICD-10-CM

## 2019-10-17 MED ORDER — BIKTARVY 50-200-25 MG PO TABS: 1.0000 | ORAL_TABLET | Freq: Every day | ORAL | 5 refills | Status: DC

## 2019-10-17 NOTE — Telephone Encounter (Signed)
Patient requesting refill for Trazodone for sleep. This medication is no longer on her profile. Routing to provider for approval. Vicki Brown

## 2019-10-23 NOTE — Telephone Encounter (Signed)
Ok to refill   thanks

## 2019-10-24 ENCOUNTER — Other Ambulatory Visit: Payer: Self-pay

## 2019-10-24 DIAGNOSIS — G4700 Insomnia, unspecified: Secondary | ICD-10-CM

## 2019-10-24 MED ORDER — TRAZODONE HCL 50 MG PO TABS: 25.0000 mg | ORAL_TABLET | Freq: Every day | ORAL | 3 refills | Status: AC

## 2019-10-24 NOTE — Progress Notes (Signed)
Verbal order per Dr. Johnnye Sima ok to renew trazodone. Patient made aware. Eugenia Mcalpine

## 2019-10-30 ENCOUNTER — Telehealth: Payer: Self-pay

## 2019-10-30 ENCOUNTER — Telehealth: Payer: Self-pay | Admitting: *Deleted

## 2019-10-30 NOTE — Telephone Encounter (Signed)
Patient called office today stating she is not able to get medication from pharmacy. Refills last sent by office on 10/17/19. Patient states she has 10-15 days left of medication. Called pharmacy to follow up on concern. Pharmacy tech states patient's insurance is not active. Is requesting primary insurance info in order to bill. Patient states she saw financial counselor in July to renew financial assistance.  Will forward message to [facility], Development worker, community.  [place]

## 2019-10-30 NOTE — Telephone Encounter (Signed)
I emailed [place] to get patient approval status. I never received an approval letter. I am unsure if the patient still have insurance. She has no application on file for this recert. I will call patient to schedule an appt

## 2019-10-31 ENCOUNTER — Telehealth: Payer: Self-pay

## 2019-10-31 NOTE — Telephone Encounter (Signed)
COVID-19 Pre-Screening Questions:10/31/19  Do you currently have a fever (>100 F), chills or unexplained body aches? NO  Are you currently experiencing new cough, shortness of breath, sore throat, runny nose? NO .  Have you been in contact with someone that is currently pending confirmation of Covid19 testing or has been confirmed to have the [place] virus? NO  **If the patient answers NO to ALL questions -  advise the patient to please call the clinic before coming to the office should any symptoms develop.

## 2019-11-01 ENCOUNTER — Ambulatory Visit: Payer: 59

## 2019-11-01 ENCOUNTER — Other Ambulatory Visit: Payer: Self-pay

## 2019-11-20 ENCOUNTER — Encounter: Payer: Self-pay | Admitting: Infectious Diseases

## 2019-11-28 ENCOUNTER — Other Ambulatory Visit: Payer: Self-pay | Admitting: Infectious Diseases

## 2019-11-28 DIAGNOSIS — I10 Essential (primary) hypertension: Secondary | ICD-10-CM

## 2019-12-14 ENCOUNTER — Other Ambulatory Visit: Payer: Self-pay | Admitting: Infectious Diseases

## 2019-12-15 ENCOUNTER — Telehealth: Payer: Self-pay | Admitting: *Deleted

## 2019-12-15 NOTE — Telephone Encounter (Signed)
Patient called with confusion about her refills. RN called [facility], asked them to look for the 6 month supply sent 10/17/19.  Pharmacy found it, will get it ready for patient. Landis Gandy, RN

## 2019-12-22 ENCOUNTER — Ambulatory Visit: Payer: 59

## 2020-01-05 ENCOUNTER — Ambulatory Visit: Payer: 59

## 2020-01-29 ENCOUNTER — Other Ambulatory Visit: Payer: Self-pay

## 2020-02-07 ENCOUNTER — Other Ambulatory Visit: Payer: 59

## 2020-02-07 ENCOUNTER — Other Ambulatory Visit: Payer: Self-pay

## 2020-02-07 DIAGNOSIS — B20 Human immunodeficiency virus [HIV] disease: Secondary | ICD-10-CM

## 2020-02-07 DIAGNOSIS — Z79899 Other long term (current) drug therapy: Secondary | ICD-10-CM

## 2020-02-07 NOTE — Progress Notes (Deleted)
Cardiology Office Note:    Date:  02/07/2020   ID:  Vicki Brown, DOB 1960/11/05, MRN [id number]  PCP:  Campbell Riches, MD  Cardiologist:  Nelva Bush, MD   Referring MD: Campbell Riches, MD   No chief complaint on file.   History of Present Illness:    Vicki Brown is a 59 y.o. female with a hx of hypertension, HLD, HIV, paroxysmal atrial fibrillation, Xarelto 20 mg/day. chronic diastolic dysfunction, and no ischemia with LVEF 72% on nuclear study (2019).  ***  Past Medical History:  Diagnosis Date  . Allergy   . HIV infection ([place])   . Hyperlipidemia   . Hypertension   . Insomnia 12/19/2014    Past Surgical History:  Procedure Laterality Date  . Broken Arm Left    Pins placed in arm  . TUBAL LIGATION      Current Medications: No outpatient medications have been marked as taking for the 02/09/20 encounter (Appointment) with Belva Crome, MD.   Current Facility-Administered Medications for the 02/09/20 encounter (Appointment) with Belva Crome, MD  Medication  . 0.9 %  sodium chloride infusion     Allergies:   Patient has no known allergies.   Social History   Socioeconomic History  . Marital status: Single    Spouse name: Not on file  . Number of children: Not on file  . Years of education: Not on file  . Highest education level: Not on file  Occupational History  . Not on file  Tobacco Use  . Smoking status: Former Smoker    Packs/day: 2.00    Years: 20.00    Pack years: 40.00    Types: Cigarettes    Quit date: 2004    Years since quitting: 17.9  . Smokeless tobacco: Never Used  . Tobacco comment: 20 years ago  Vaping Use  . Vaping Use: Never used  Substance and Sexual Activity  . Alcohol use: Yes    Alcohol/week: 6.0 standard drinks    Types: 6 Cans of beer per week  . Drug use: Yes    Frequency: 0.5 times per week    Types: Marijuana  . Sexual activity: Yes    Partners: Male    Birth control/protection: Condom     Comment: accepted condoms  Other Topics Concern  . Not on file  Social History Narrative  . Not on file   Social Determinants of Health   Financial Resource Strain:   . Difficulty of Paying Living Expenses: Not on file  Food Insecurity:   . Worried About Charity fundraiser in the Last Year: Not on file  . Ran Out of Food in the Last Year: Not on file  Transportation Needs:   . Lack of Transportation (Medical): Not on file  . Lack of Transportation (Non-Medical): Not on file  Physical Activity:   . Days of Exercise per Week: Not on file  . Minutes of Exercise per Session: Not on file  Stress:   . Feeling of Stress : Not on file  Social Connections:   . Frequency of Communication with Friends and Family: Not on file  . Frequency of Social Gatherings with Friends and Family: Not on file  . Attends Religious Services: Not on file  . Active Member of Clubs or Organizations: Not on file  . Attends Archivist Meetings: Not on file  . Marital Status: Not on file     Family History: The patient's family  history includes Breast cancer in her maternal aunt; Cancer in her mother; Diabetes in her daughter; Mental illness in her brother; Seizures in her father. There is no history of Colon polyps, Esophageal cancer, Rectal cancer, Stomach cancer, or Thyroid disease.  ROS:   Please see the history of present illness.    *** All other systems reviewed and are negative.  EKGs/Labs/Other Studies Reviewed:    The following studies were reviewed today: ECHOCARDIOGRAPHY 07/2019: IMPRESSIONS    1. Left ventricular ejection fraction, by estimation, is 60 to 65%. The  left ventricle has normal function. The left ventricle has no regional  wall motion abnormalities. Left ventricular diastolic parameters were  normal.  2. Right ventricular systolic function is normal. The right ventricular  size is normal. There is normal pulmonary artery systolic pressure.  3. The mitral valve  is normal in structure. Trivial mitral valve  regurgitation. No evidence of mitral stenosis.  4. The aortic valve is tricuspid. Aortic valve regurgitation is trivial.  Mild aortic valve sclerosis is present, with no evidence of aortic valve  stenosis.  5. The inferior vena cava is normal in size with greater than 50%  respiratory variability, suggesting right atrial pressure of 3 mmHg.   EKG:  EKG ***  Recent Labs: 05/12/2019: ALT 27; BUN 10; Creatinine, Ser 1.01; Potassium 3.7; Sodium 139 05/19/2019: TSH 0.36 02/07/2020: Hemoglobin 10.4; Platelets 227  Recent Lipid Panel    Component Value Date/Time   CHOL 227 (H) 04/13/2019 0914   TRIG 57 04/13/2019 0914   HDL 92 04/13/2019 0914   CHOLHDL 2.5 04/13/2019 0914   VLDL 13 04/22/2016 1344   LDLCALC 121 (H) 04/13/2019 0914    Physical Exam:    VS:  There were no vitals taken for this visit.    Wt Readings from Last 3 Encounters:  08/09/19 168 lb 12.8 oz (76.6 kg)  07/11/19 165 lb (74.8 kg)  06/21/19 170 lb 6.4 oz (77.3 kg)     GEN: ***. No acute distress HEENT: Normal NECK: No JVD. LYMPHATICS: No lymphadenopathy CARDIAC: *** RRR without murmur, gallop, or edema. VASCULAR: *** Normal Pulses. No bruits. RESPIRATORY:  Clear to auscultation without rales, wheezing or rhonchi  ABDOMEN: Soft, non-tender, non-distended, No pulsatile mass, MUSCULOSKELETAL: No deformity  SKIN: Warm and dry NEUROLOGIC:  Alert and oriented x 3 PSYCHIATRIC:  Normal affect   ASSESSMENT:    1. Longstanding persistent atrial fibrillation ([place])   2. Hyperlipidemia, unspecified hyperlipidemia type   3. Essential hypertension   4. Shortness of breath   5. Educated about COVID-19 virus infection    PLAN:    In order of problems listed above:  1. ***   Medication Adjustments/Labs and Tests Ordered: Current medicines are reviewed at length with the patient today.  Concerns regarding medicines are outlined above.  No orders of the defined types  were placed in this encounter.  No orders of the defined types were placed in this encounter.   There are no Patient Instructions on file for this visit.   Signed, Sinclair Grooms, MD  02/07/2020 5:32 PM    [place]

## 2020-02-08 LAB — T-HELPER CELL (CD4) - (RCID CLINIC ONLY)
CD4 % Helper T Cell: 65 % (ref 33–65)
CD4 T Cell Abs: 957 /uL (ref 400–1790)

## 2020-02-09 ENCOUNTER — Ambulatory Visit: Payer: Self-pay | Admitting: Interventional Cardiology

## 2020-02-09 DIAGNOSIS — E785 Hyperlipidemia, unspecified: Secondary | ICD-10-CM

## 2020-02-09 DIAGNOSIS — I10 Essential (primary) hypertension: Secondary | ICD-10-CM

## 2020-02-09 DIAGNOSIS — R0602 Shortness of breath: Secondary | ICD-10-CM

## 2020-02-09 DIAGNOSIS — I4811 Longstanding persistent atrial fibrillation: Secondary | ICD-10-CM

## 2020-02-09 DIAGNOSIS — Z7189 Other specified counseling: Secondary | ICD-10-CM

## 2020-02-09 LAB — CBC WITH DIFFERENTIAL/PLATELET
Absolute Monocytes: 270 cells/uL (ref 200–950)
Basophils Absolute: 9 cells/uL (ref 0–200)
Basophils Relative: 0.3 %
Eosinophils Absolute: 71 cells/uL (ref 15–500)
Eosinophils Relative: 2.3 %
HCT: 30.6 % — ABNORMAL LOW (ref 35.0–45.0)
Hemoglobin: 10.4 g/dL — ABNORMAL LOW (ref 11.7–15.5)
Lymphs Abs: 1541 cells/uL (ref 850–3900)
MCH: 32.9 pg (ref 27.0–33.0)
MCHC: 34 g/dL (ref 32.0–36.0)
MCV: 96.8 fL (ref 80.0–100.0)
MPV: 9.5 fL (ref 7.5–12.5)
Monocytes Relative: 8.7 %
Neutro Abs: 1209 cells/uL — ABNORMAL LOW (ref 1500–7800)
Neutrophils Relative %: 39 %
Platelets: 227 10*3/uL (ref 140–400)
RBC: 3.16 10*6/uL — ABNORMAL LOW (ref 3.80–5.10)
RDW: 12.5 % (ref 11.0–15.0)
Total Lymphocyte: 49.7 %
WBC: 3.1 10*3/uL — ABNORMAL LOW (ref 3.8–10.8)

## 2020-02-09 LAB — COMPLETE METABOLIC PANEL WITH GFR
AG Ratio: 1.2 (calc) (ref 1.0–2.5)
ALT: 61 U/L — ABNORMAL HIGH (ref 6–29)
AST: 64 U/L — ABNORMAL HIGH (ref 10–35)
Albumin: 3.5 g/dL — ABNORMAL LOW (ref 3.6–5.1)
Alkaline phosphatase (APISO): 74 U/L (ref 37–153)
BUN: 11 mg/dL (ref 7–25)
CO2: 26 mmol/L (ref 20–32)
Calcium: 9 mg/dL (ref 8.6–10.4)
Chloride: 108 mmol/L (ref 98–110)
Creat: 0.95 mg/dL (ref 0.50–0.99)
GFR, Est African American: 75 mL/min/{1.73_m2} (ref >=60)
GFR, Est Non African American: 65 mL/min/{1.73_m2} (ref >=60)
Globulin: 3 g/dL (calc) (ref 1.9–3.7)
Glucose, Bld: 100 mg/dL — ABNORMAL HIGH (ref 65–99)
Potassium: 4.1 mmol/L (ref 3.5–5.3)
Sodium: 139 mmol/L (ref 135–146)
Total Bilirubin: 0.3 mg/dL (ref 0.2–1.2)
Total Protein: 6.5 g/dL (ref 6.1–8.1)

## 2020-02-09 LAB — LIPID PANEL
Cholesterol: 195 mg/dL (ref <200)
HDL: 70 mg/dL (ref >=50)
LDL Cholesterol (Calc): 110 mg/dL (calc) — ABNORMAL HIGH
Non-HDL Cholesterol (Calc): 125 mg/dL (calc) (ref <130)
Total CHOL/HDL Ratio: 2.8 (calc) (ref <5.0)
Triglycerides: 64 mg/dL (ref <150)

## 2020-02-09 LAB — HIV-1 RNA QUANT-NO REFLEX-BLD
HIV 1 RNA Quant: <20 Copies/mL
HIV-1 RNA Quant, Log: <1.3 Log cps/mL

## 2020-02-19 ENCOUNTER — Ambulatory Visit: Payer: Self-pay | Admitting: Infectious Diseases

## 2020-02-21 ENCOUNTER — Other Ambulatory Visit (HOSPITAL_COMMUNITY)
Admission: RE | Admit: 2020-02-21 | Discharge: 2020-02-21 | Disposition: A | Discharge status: Home / Self Care | Payer: 59 | Source: Ambulatory Visit | Attending: Infectious Diseases | Admitting: Infectious Diseases

## 2020-02-21 ENCOUNTER — Other Ambulatory Visit: Payer: Self-pay

## 2020-02-21 ENCOUNTER — Ambulatory Visit (INDEPENDENT_AMBULATORY_CARE_PROVIDER_SITE_OTHER): Payer: 59 | Admitting: Infectious Diseases

## 2020-02-21 ENCOUNTER — Encounter: Payer: Self-pay | Admitting: Infectious Diseases

## 2020-02-21 VITALS — Wt 176.8 lb

## 2020-02-21 DIAGNOSIS — J301 Allergic rhinitis due to pollen: Secondary | ICD-10-CM | POA: Diagnosis not present

## 2020-02-21 DIAGNOSIS — E785 Hyperlipidemia, unspecified: Secondary | ICD-10-CM | POA: Diagnosis not present

## 2020-02-21 DIAGNOSIS — Z79899 Other long term (current) drug therapy: Secondary | ICD-10-CM

## 2020-02-21 DIAGNOSIS — Z124 Encounter for screening for malignant neoplasm of cervix: Secondary | ICD-10-CM

## 2020-02-21 DIAGNOSIS — Z113 Encounter for screening for infections with a predominantly sexual mode of transmission: Secondary | ICD-10-CM

## 2020-02-21 DIAGNOSIS — B20 Human immunodeficiency virus [HIV] disease: Secondary | ICD-10-CM | POA: Diagnosis not present

## 2020-02-21 MED ORDER — BIKTARVY 50-200-25 MG PO TABS: 1.0000 | ORAL_TABLET | Freq: Every day | ORAL | 11 refills | Status: DC

## 2020-02-21 NOTE — Assessment & Plan Note (Signed)
Overall lipid panel looks good - she is not diabetic, not smoker. Will continue to monitor for now with plan to start statin for primary prevention if LDL >130

## 2020-02-21 NOTE — Assessment & Plan Note (Signed)
Discussed management with OTC antihistamines and flonase nasal spray.  Advised to report an new symptoms of fever, cough or sore throat - would then recommend COVID test.

## 2020-02-21 NOTE — Assessment & Plan Note (Signed)
Viral load remains undetectable with healthy CD4 count. She has done very well on treatment. Will continue Biktarvy once a day. Advised to decrease ibuprofen use and try tylenol first hand for pain/aches.   Vaccination counseling discussed at today's visit. Updated as outlined per recommendations for PLWH at today's visit. She will get Moderna for booster - discussed today  Last cervical cancer screening:  [facility] PAP         Ordered - PAP SMEAR-Modifier (Every 3 Years) Ordered on 02/21/2020   02/24/2017  CYTOLOGY - PAP component of Cytology - PAP   Only the first 1 history entries have been loaded, but more history exists.         Results were: normal --> collected today Contraception: abstinence. No family planning or pre-conception counseling needed at this time.   Last Mammogram: [facility] Mammogram         MAMMOGRAM (Every 2 Years) Next due on 06/05/2021   06/06/2019  [facility] DIGITAL SCREENING BILATERAL   Only the first 1 history entries have been loaded, but more history exists.         Results were: normal Last colon cancer screening: [facility] Colonoscopy         COLONOSCOPY (Every 5 Years) Next due on 08/31/2021   08/31/2016  COLONOSCOPY         Depression screening discussed today - no needs at this time.  No dental needs identified today.  STI screening offered today. Condoms offered.  RTC in 1 year.

## 2020-02-21 NOTE — Assessment & Plan Note (Signed)
Normal pelvic exam. Normal bimanual exam.  Cervical brushing collected for cytology and HPV. Some bleeding noted after collection  Discussed recommended screening interval for women living with HIV disease meant to be lifelong and at an interval of Q1-3 years pending results. Acceptable to space out Q3y with 3 consecutively normal exams. Further recommendations for Vicki Brown to follow today's results.  Results will be communicated to the patient via tMyChart

## 2020-02-21 NOTE — Patient Instructions (Addendum)
Happy Holidays - nice to see you!  For back pain -  1. Make sure you have fresh shoes every 3 months or so if you are doing a lot of walking at work. I think any athletic shoe that feels comfortable is perfect for you 2. Consider a back brace to help with posture 3. Can do tylenol up to 3 times a day. OK to do aleve or Advil sometimes but take that with food.  4. Would encourage you to do some gentle stretching before bed.    For Allergies -  1. Over the counter generic cetirizine (Zyrtec) or loratadine (claritin) once a day  2. Nasal spray called Flonase (look online for a coupon) one spray 1-2 times a day each nostril. This one takes a week or so to work so don't give up on it yet!  If you start noticing you are developing a cough or sore throat or fever - please call to let me know.   Continue your Biktarvy once a day as you are doing. Labs look great!  Will let you know the results of today's tests once available.   Plan to see you back again in months with labs before hand.

## 2020-02-21 NOTE — Progress Notes (Signed)
Name: Vicki Brown  DOB: Feb 02, 1961 MRN: [id number] PCP: Campbell Riches, MD    Patient Active Problem List   Diagnosis Date Noted  . Screening for cervical cancer 02/21/2020  . Multinodular goiter 06/01/2019  . Elevated liver function tests 04/22/2018  . Hyperthyroidism 01/27/2018  . Hoarseness 01/27/2018  . Atrial fibrillation ([place]) 10/21/2017  . Hyperlipidemia 10/21/2017  . Insomnia 12/19/2014  . Allergic rhinitis 12/28/2012  . Dyslipidemia 07/31/2010  . Human immunodeficiency virus (HIV) disease ([place]) 01/19/2008  . PERIPHERAL NEUROPATHY 01/19/2008  . Essential hypertension 01/19/2008      Brief Narrative:  Vicki Brown is a 59 y.o. female with HIV infection, Dx March 2009. History of OIs: none   Previous Regimens: . Atripla . Biktarvy  Genotypes: . No RT/PI mutations in past   Subjective:  CC:  HIV follow up care 1 year follow up Concerned about skin color change to cheek     HPI: Lorriane Brown is doing well. She started a new job at [employer] that has her walking a lot more. She has had some lower back pain that she feels is due to increased activity. No injury to explain. Described to be pretty mild overall. Not taking anything for it currently.   Doing well with Biktarvy once daily. She has not missed any doses and no trouble with access to medication. No side effects that she has noticed. No new medications to declare or supplements.   Wants to get her booster but prefers Moderna based of her hearing it is more effective. She received her last Pfizer vaccine in April 2021 and had no problems with that. Did receive her flu shot a few weeks ago.   Nasal congestion and rhinorrhea, relatively new onset. No sore throat or fevers. Some clear discharge from eyes. No known sick contacts. Wears mask out at work and otherwise.    Review of Systems  Constitutional: Negative for chills, fever, malaise/fatigue and weight loss.  HENT: Positive for congestion.  Negative for ear pain and sore throat.        No dental problems  Eyes: Positive for discharge (clear). Negative for pain.  Respiratory: Negative for cough and sputum production.   Cardiovascular: Negative for chest pain and leg swelling.  Gastrointestinal: Negative for abdominal pain, diarrhea and vomiting.  Genitourinary: Negative for dysuria and flank pain.  Musculoskeletal: Positive for back pain. Negative for joint pain, myalgias and neck pain.  Skin: Negative for rash.  Neurological: Negative for dizziness, tingling and headaches.  Psychiatric/Behavioral: Negative for depression and substance abuse. The patient is not nervous/anxious and does not have insomnia.     Past Medical History:  Diagnosis Date  . Allergy   . HIV infection ([place])   . Hyperlipidemia   . Hypertension   . Insomnia 12/19/2014    Outpatient Medications Prior to Visit  Medication Sig Dispense Refill  . acetaminophen (TYLENOL) 500 MG tablet Take 2 tablets (1,000 mg total) by mouth every 8 (eight) hours as needed. 30 tablet 0  . ibuprofen (ADVIL,MOTRIN) 800 MG tablet TAKE 1 TABLET(800 MG) BY MOUTH TWICE DAILY WITH FOOD AS NEEDED 30 tablet 0  . lisinopril-hydrochlorothiazide (ZESTORETIC) 10-12.5 MG tablet TAKE 1 TABLET BY MOUTH DAILY 90 tablet 1  . metoprolol succinate (TOPROL XL) 25 MG 24 hr tablet Take 1 tablet (25 mg total) by mouth daily. 90 tablet 3  . rivaroxaban (XARELTO) 20 MG TABS tablet Take 1 tablet (20 mg total) by mouth daily with supper. 30 tablet  11  . traZODone (DESYREL) 50 MG tablet Take 0.5 tablets (25 mg total) by mouth at bedtime. 30 tablet 3  . bictegravir-emtricitabine-tenofovir AF (BIKTARVY) 50-200-25 MG TABS tablet Take 1 tablet by mouth daily. 30 tablet 5   Facility-Administered Medications Prior to Visit  Medication Dose Route Frequency Provider Last Rate Last Admin  . 0.9 %  sodium chloride infusion  500 mL Intravenous Continuous Danis, Kirke Corin, MD         No Known  Allergies  Social History   Tobacco Use  . Smoking status: Former Smoker    Packs/day: 2.00    Years: 20.00    Pack years: 40.00    Types: Cigarettes    Quit date: 2004    Years since quitting: 17.9  . Smokeless tobacco: Never Used  . Tobacco comment: 20 years ago  Vaping Use  . Vaping Use: Never used  Substance Use Topics  . Alcohol use: Yes    Alcohol/week: 6.0 standard drinks    Types: 6 Cans of beer per week  . Drug use: Yes    Frequency: 0.5 times per week    Types: Marijuana    Family History  Problem Relation Age of Onset  . Cancer Mother        uterine vs ovarian?  . Seizures Father   . Mental illness Brother   . Breast cancer Maternal Aunt   . Diabetes Daughter   . Colon polyps Neg Hx   . Esophageal cancer Neg Hx   . Rectal cancer Neg Hx   . Stomach cancer Neg Hx   . Thyroid disease Neg Hx     Social History   Substance and Sexual Activity  Sexual Activity Yes  . Partners: Male  . Birth control/protection: Condom   Comment: accepted condoms     Objective:   Vitals:   02/21/20 1413  Weight: 176 lb 12.8 oz (80.2 kg)   Body mass index is 28.54 kg/m.  Physical Exam Exam conducted with a chaperone present.  Constitutional:      Appearance: Normal appearance. She is not ill-appearing.  HENT:     Head: Normocephalic.     Nose: Congestion and rhinorrhea present.     Mouth/Throat:     Mouth: Mucous membranes are moist.     Pharynx: Oropharynx is clear.  Eyes:     General: No scleral icterus.    Conjunctiva/sclera: Conjunctivae normal.  Cardiovascular:     Rate and Rhythm: Normal rate and regular rhythm.  Pulmonary:     Effort: Pulmonary effort is normal.  Genitourinary:    Vagina: Normal.     Cervix: Cervical bleeding present. No erythema. Discharge: normal expected discharge   Musculoskeletal:     Comments: Back exam normal. No radiculopathy symptoms. No tenderness   Skin:    General: Skin is warm and dry.     Capillary Refill:  Capillary refill takes less than 2 seconds.  Neurological:     Mental Status: She is oriented to person, place, and time.  Psychiatric:        Mood and Affect: Mood normal.        Thought Content: Thought content normal.      Lab Results Lab Results  Component Value Date   WBC 3.1 (L) 02/07/2020   HGB 10.4 (L) 02/07/2020   HCT 30.6 (L) 02/07/2020   MCV 96.8 02/07/2020   PLT 227 02/07/2020    Lab Results  Component Value Date   CREATININE  0.95 02/07/2020   BUN 11 02/07/2020   NA 139 02/07/2020   K 4.1 02/07/2020   CL 108 02/07/2020   CO2 26 02/07/2020    Lab Results  Component Value Date   ALT 61 (H) 02/07/2020   AST 64 (H) 02/07/2020   ALKPHOS 55 05/12/2019   BILITOT 0.3 02/07/2020    Lab Results  Component Value Date   CHOL 195 02/07/2020   HDL 70 02/07/2020   LDLCALC 110 (H) 02/07/2020   TRIG 64 02/07/2020   CHOLHDL 2.8 02/07/2020   HIV 1 RNA Quant  Date Value  02/07/2020 <20 Copies/mL  04/13/2019 <20 NOT DETECTED copies/mL  03/31/2018 30 copies/mL (H)   CD4 T Cell Abs (/uL)  Date Value  02/07/2020 957  04/13/2019 907  03/31/2018 780     Assessment & Plan:   Problem List Items Addressed This Visit      Unprioritized   Screening for cervical cancer    Normal pelvic exam. Normal bimanual exam.  Cervical brushing collected for cytology and HPV. Some bleeding noted after collection  Discussed recommended screening interval for women living with HIV disease meant to be lifelong and at an interval of Q1-3 years pending results. Acceptable to space out Q3y with 3 consecutively normal exams. Further recommendations for Maili L Gravely to follow today's results.  Results will be communicated to the patient via tMyChart       Relevant Orders   Cytology - PAP( [place])   Human immunodeficiency virus (HIV) disease ([place]) - Primary (Chronic)    Viral load remains undetectable with healthy CD4 count. She has done very well on treatment. Will continue  Biktarvy once a day. Advised to decrease ibuprofen use and try tylenol first hand for pain/aches.   Vaccination counseling discussed at today's visit. Updated as outlined per recommendations for [facility] at today's visit. She will get Moderna for booster - discussed today  Last cervical cancer screening:  [facility] PAP         Ordered - PAP SMEAR-Modifier (Every 3 Years) Ordered on 02/21/2020   02/24/2017  CYTOLOGY - PAP component of Cytology - PAP   Only the first 1 history entries have been loaded, but more history exists.         Results were: normal --> collected today Contraception: abstinence. No family planning or pre-conception counseling needed at this time.   Last Mammogram: [facility] Mammogram         MAMMOGRAM (Every 2 Years) Next due on 06/05/2021   06/06/2019  MM DIGITAL SCREENING BILATERAL   Only the first 1 history entries have been loaded, but more history exists.         Results were: normal Last colon cancer screening: [facility] Colonoscopy         COLONOSCOPY (Every 5 Years) Next due on 08/31/2021   08/31/2016  COLONOSCOPY         Depression screening discussed today - no needs at this time.  No dental needs identified today.  STI screening offered today. Condoms offered.  RTC in 1 year.         Relevant Medications   bictegravir-emtricitabine-tenofovir AF (BIKTARVY) 50-200-25 MG TABS tablet   Other Relevant Orders   HIV-1 RNA quant-no reflex-bld   T-helper cell (CD4)- ([facility] clinic only)   COMPLETE METABOLIC PANEL WITH GFR   CBC with Differential/Platelet   Dyslipidemia    Overall lipid panel looks good - she is not diabetic, not smoker. Will continue to monitor for  now with plan to start statin for primary prevention if LDL >130       Allergic rhinitis (Chronic)    Discussed management with OTC antihistamines and flonase nasal spray.  Advised to report an new symptoms of fever, cough or sore throat - would then recommend COVID test.        Other Visit Diagnoses     Routine screening for STI (sexually transmitted infection)       Relevant Orders   Urine cytology ancillary only   RPR   High risk medication use       Relevant Orders   Lipid panel      Janene Madeira, MSN, NP-C [facility] Pager: [phone] Office: [phone]  02/21/20  3:10 PM

## 2020-02-22 LAB — URINE CYTOLOGY ANCILLARY ONLY
Chlamydia: NEGATIVE
Comment: NEGATIVE
Comment: NORMAL
Neisseria Gonorrhea: NEGATIVE

## 2020-02-23 LAB — CYTOLOGY - PAP
Comment: NEGATIVE
Diagnosis: NEGATIVE
High risk HPV: NEGATIVE

## 2020-03-14 ENCOUNTER — Telehealth: Payer: Self-pay

## 2020-03-14 NOTE — Telephone Encounter (Signed)
Received call from patient, she states she has had a headache and "sniffling" for about a week and a half. Patient has not recently been tested for COVID. RN advised patient to check the [facility] website to find a testing location or to check a local pharmacy for an at-home test. Patient is scheduled to receive her booster this Saturday. RN advised patient to be tested before then. Patient verbalized understanding and has no further questions.   Sandie Ano, RN

## 2020-03-16 ENCOUNTER — Ambulatory Visit: Payer: 59 | Attending: Internal Medicine

## 2020-03-16 DIAGNOSIS — Z23 Encounter for immunization: Secondary | ICD-10-CM

## 2020-03-16 NOTE — Progress Notes (Signed)
   Covid-19 Vaccination Clinic  Name:  Vicki Brown    MRN: [id number] DOB: 01-21-1961  03/16/2020  [name] was observed post Covid-19 immunization for 15 minutes without incident. She was provided with Vaccine Information Sheet and instruction to access the V-Safe system.   [name] was instructed to call 911 with any severe reactions post vaccine: [name] Difficulty breathing  . Swelling of face and throat  . A fast heartbeat  . A bad rash all over body  . Dizziness and weakness   Immunizations Administered    Name Date Dose VIS Date Route   Pfizer COVID-19 Vaccine 03/16/2020 12:53 PM 0.3 mL 12/27/2019 Intramuscular   Manufacturer: [place]   Lot: [id number]   [place]: [id number]

## 2020-03-18 ENCOUNTER — Other Ambulatory Visit: Payer: 59

## 2020-03-27 ENCOUNTER — Other Ambulatory Visit: Payer: Self-pay

## 2020-03-27 ENCOUNTER — Encounter (HOSPITAL_COMMUNITY): Payer: Self-pay

## 2020-03-27 ENCOUNTER — Ambulatory Visit (HOSPITAL_COMMUNITY)
Admission: EM | Admit: 2020-03-27 | Discharge: 2020-03-27 | Disposition: A | Discharge status: Home / Self Care | Payer: 59 | Attending: Family Medicine | Admitting: Family Medicine

## 2020-03-27 DIAGNOSIS — J069 Acute upper respiratory infection, unspecified: Secondary | ICD-10-CM

## 2020-03-27 MED ORDER — HYDROCODONE-HOMATROPINE 5-1.5 MG/5ML PO SYRP: 5.0000 mL | ORAL_SOLUTION | Freq: Four times a day (QID) | ORAL | 0 refills | Status: DC | PRN

## 2020-03-27 NOTE — ED Triage Notes (Signed)
Patient presents to Urgent Care with complaints of cough,  headache x 2 weeks. Pt tested negative for covid 2 weeks ago. Pt treating symptoms with OTC cold medication.   Denies abdominal pain, n/v, or diarrhea.

## 2020-03-27 NOTE — Discharge Instructions (Addendum)
Be aware, your cough medication may cause drowsiness. Please do not drive, operate heavy machinery or make important decisions while on this medication, it can cloud your judgement.  

## 2020-03-27 NOTE — ED Provider Notes (Signed)
[facility]   [id number] 03/27/20 Arrival Time: 9767  ASSESSMENT & PLAN:  1. Viral URI with cough     Feeling better. Cough mainly bothering her. No SOB. Afebrile.  Meds ordered this encounter  Medications  . HYDROcodone-homatropine (HYCODAN) 5-1.5 MG/5ML syrup    Sig: Take 5 mLs by mouth every 6 (six) hours as needed for cough.    Dispense:  90 mL    Refill:  0     Follow-up Information    [facility].   Specialty: Urgent Care Why: If worsening or failing to improve as anticipated. Contact information: [place] [phone]              Reviewed expectations re: course of current medical issues. Questions answered. Outlined signs and symptoms indicating need for more acute intervention. Understanding verbalized. After Visit Summary given.   SUBJECTIVE: History from: patient. Vicki Brown is a 60 y.o. female who reports URI symptoms past 2 weeks; improving; lingering cough bothering her, affecting sleep. No SOB. Afebrile. Normal PO intake without n/v/d.    OBJECTIVE:  Vitals:   03/27/20 1538 03/27/20 1540  BP:  126/86  Pulse:  87  Resp:  16  Temp:  99.4 F (37.4 C)  TempSrc:  Oral  SpO2:  100%  Weight: 77.1 kg     General appearance: alert; no distress Eyes: PERRLA; EOMI; conjunctiva normal HENT: [place]; AT; with mild nasal congestion Neck: supple  Lungs: speaks full sentences without difficulty; unlabored; dry cough; CTAB Extremities: no edema Skin: warm and dry Neurologic: normal gait Psychological: alert and cooperative; normal mood and affect   No Known Allergies  Past Medical History:  Diagnosis Date  . Allergy   . HIV infection ([place])   . Hyperlipidemia   . Hypertension   . Insomnia 12/19/2014   Social History   Socioeconomic History  . Marital status: Single    Spouse name: Not on file  . Number of children: Not on file  . Years of education: Not on  file  . Highest education level: Not on file  Occupational History  . Not on file  Tobacco Use  . Smoking status: Former Smoker    Packs/day: 2.00    Years: 20.00    Pack years: 40.00    Types: Cigarettes    Quit date: 2004    Years since quitting: 18.0  . Smokeless tobacco: Never Used  . Tobacco comment: 20 years ago  Vaping Use  . Vaping Use: Never used  Substance and Sexual Activity  . Alcohol use: Yes    Alcohol/week: 6.0 standard drinks    Types: 6 Cans of beer per week  . Drug use: Yes    Frequency: 0.5 times per week    Types: Marijuana  . Sexual activity: Yes    Partners: Male    Birth control/protection: Condom    Comment: accepted condoms  Other Topics Concern  . Not on file  Social History Narrative  . Not on file   Social Determinants of Health   Financial Resource Strain: Not on file  Food Insecurity: Not on file  Transportation Needs: Not on file  Physical Activity: Not on file  Stress: Not on file  Social Connections: Not on file  Intimate Partner Violence: Not on file   Family History  Problem Relation Age of Onset  . Cancer Mother        uterine vs ovarian?  [name]  Seizures Father   . Mental illness Brother   . Breast cancer Maternal Aunt   . Diabetes Daughter   . Colon polyps Neg Hx   . Esophageal cancer Neg Hx   . Rectal cancer Neg Hx   . Stomach cancer Neg Hx   . Thyroid disease Neg Hx    Past Surgical History:  Procedure Laterality Date  . Broken Arm Left    Pins placed in arm  . TUBAL LIGATION       Vanessa Kick, MD 03/27/20 [phone]

## 2020-04-04 IMAGING — MG DIGITAL SCREENING BILATERAL MAMMOGRAM WITH CAD
5 series · 5 of 5 positions shown · non-contrast
Comparison: Previous exam(s).

CLINICAL DATA: Screening.

EXAM:
DIGITAL SCREENING BILATERAL MAMMOGRAM WITH CAD

[R MLO]
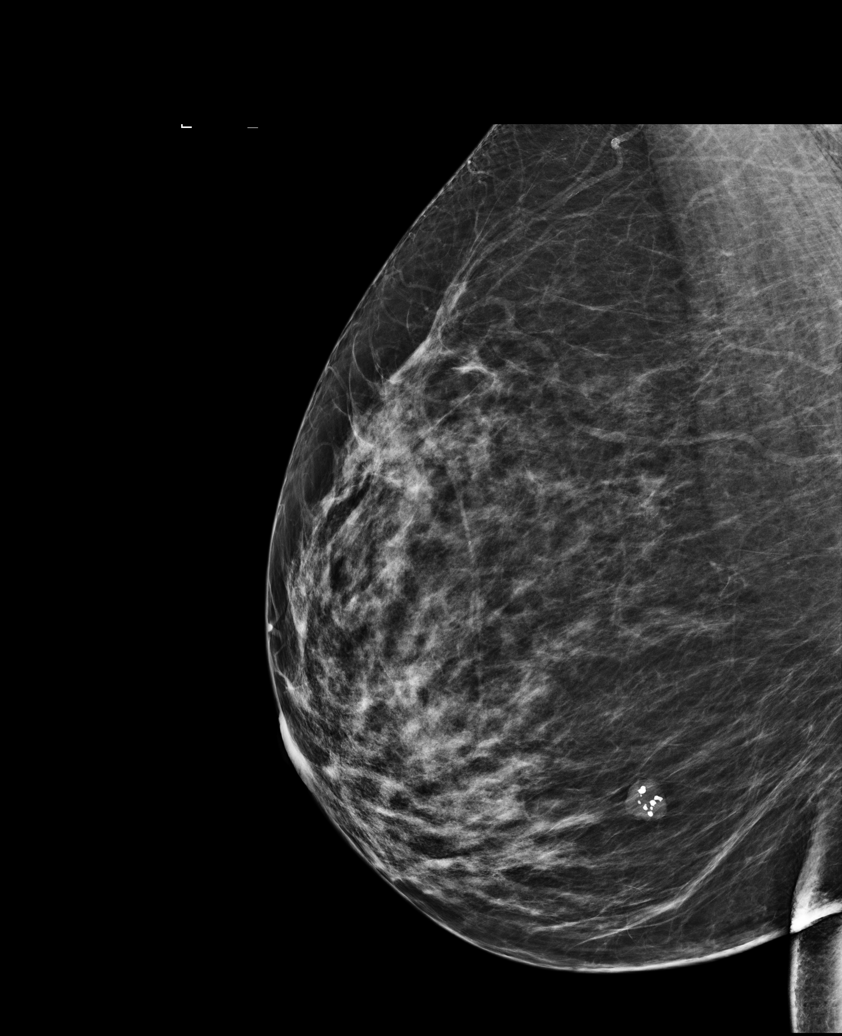

[L MLO (1 of 2)]
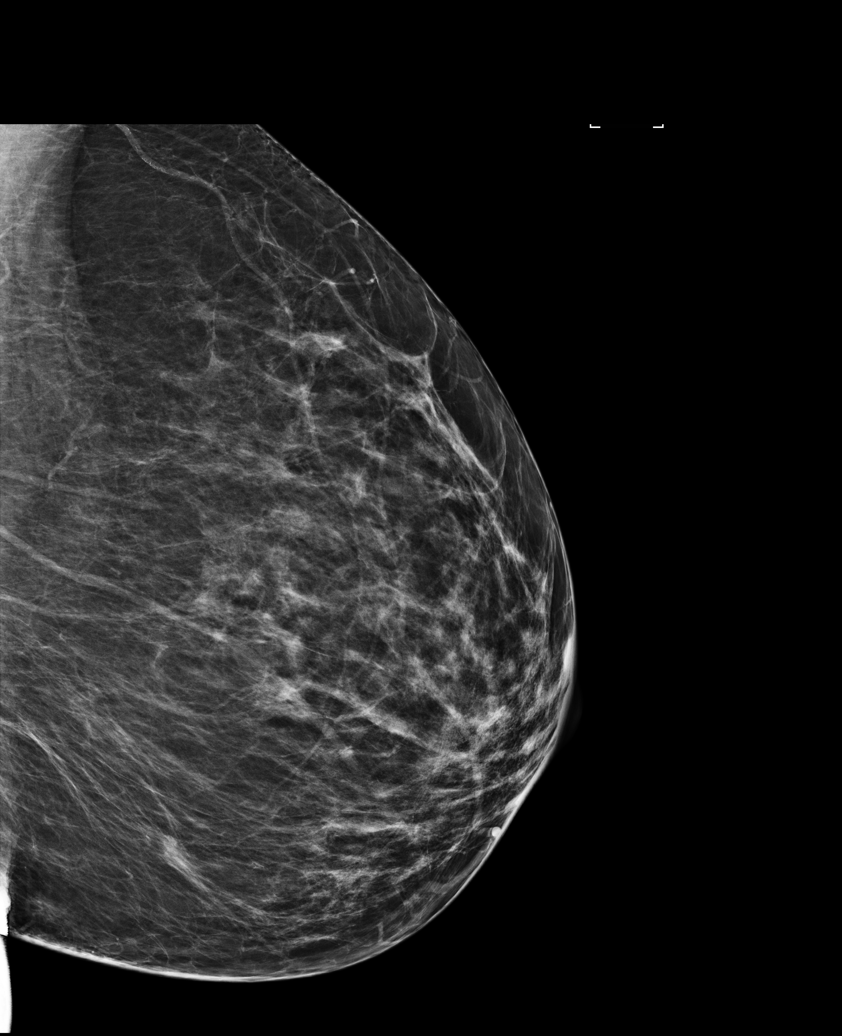

[L MLO (2 of 2)]
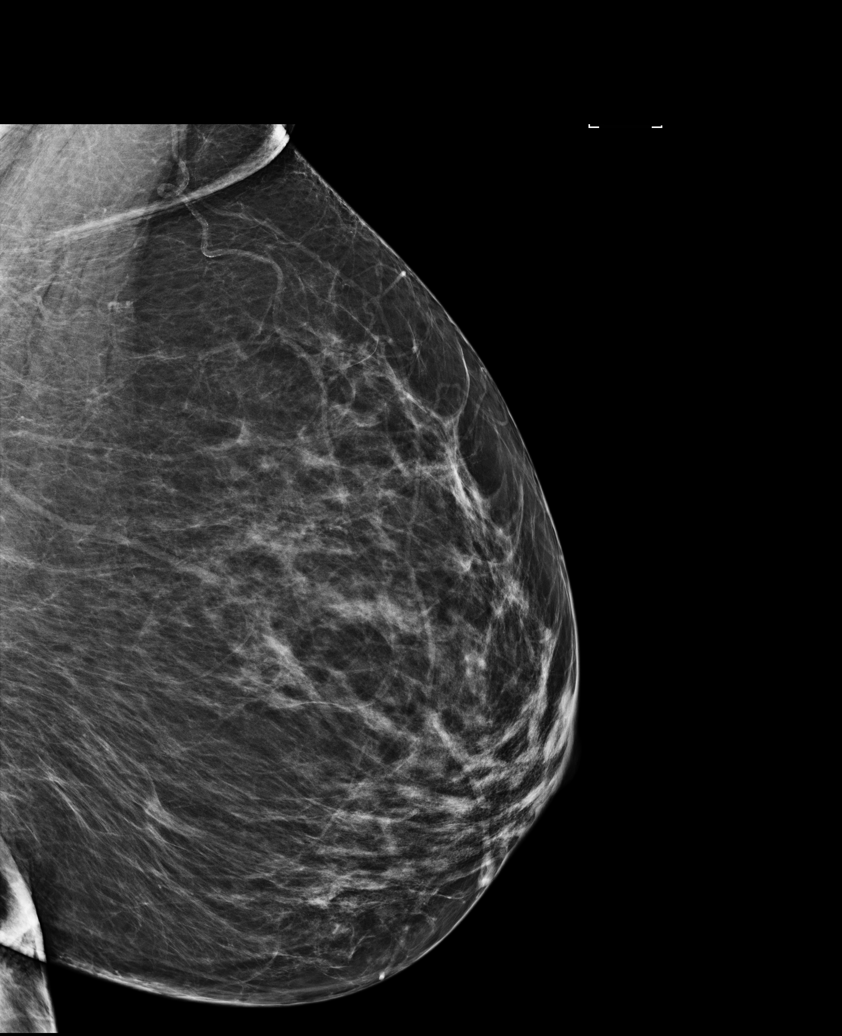

[L CC]
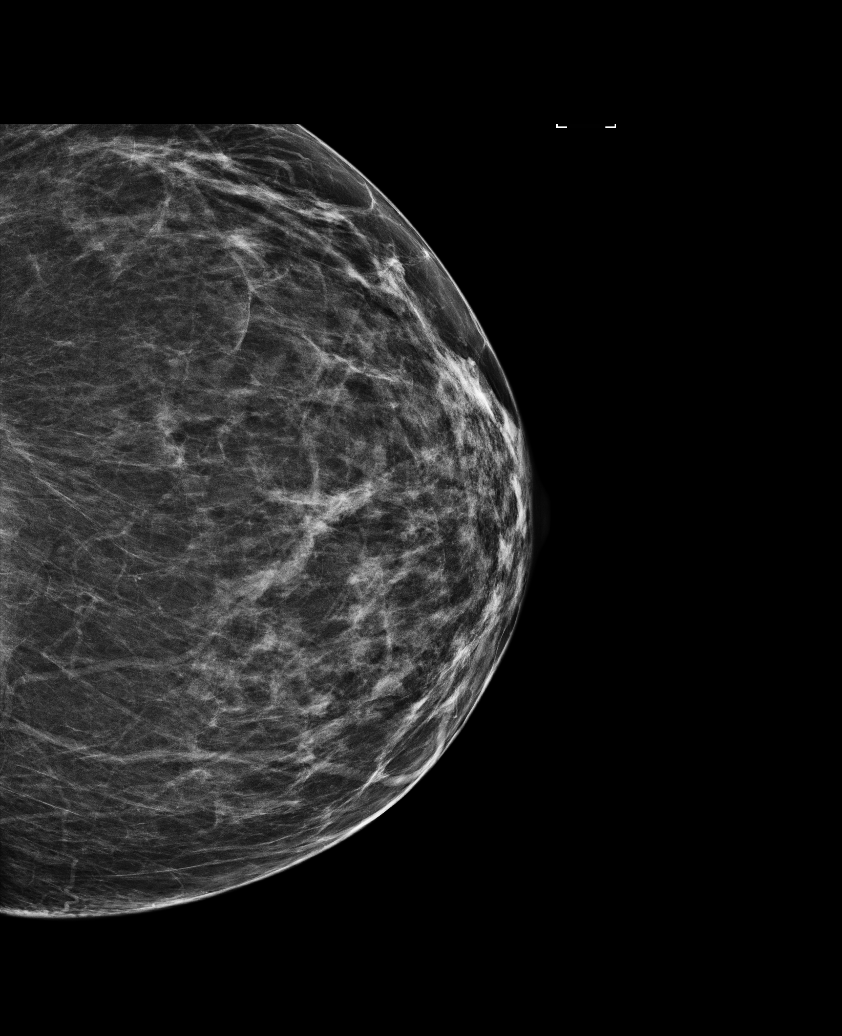

[R CC]
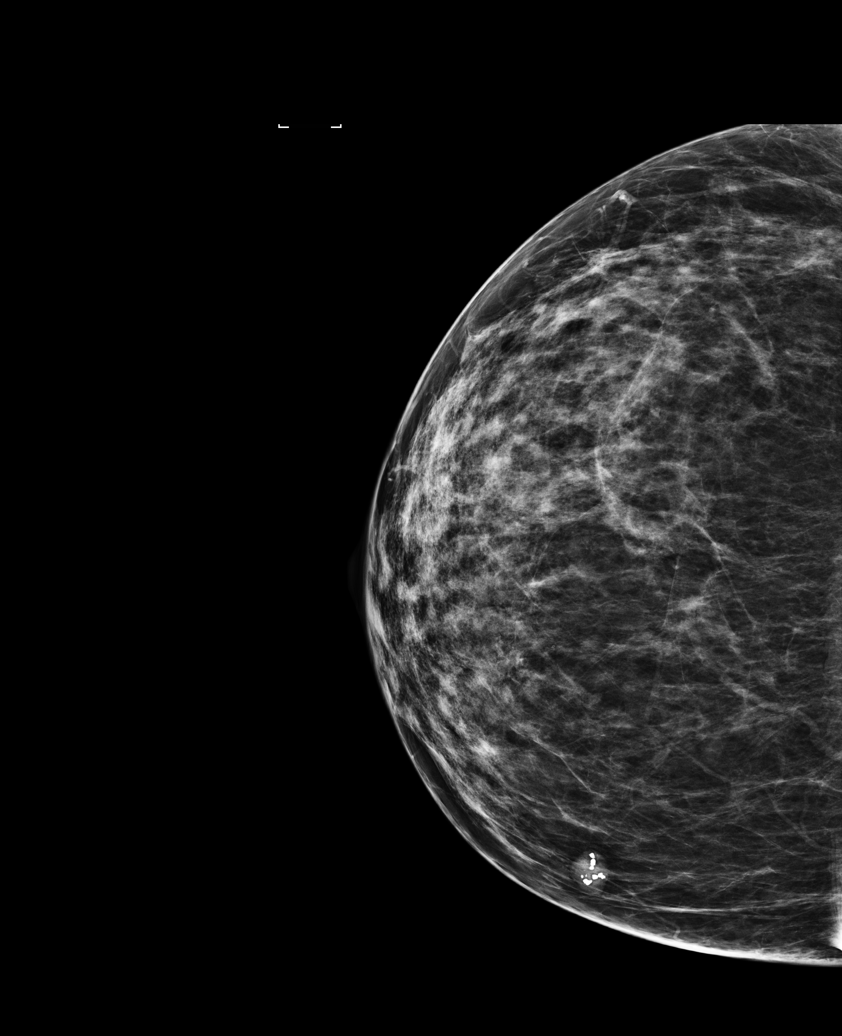

[5 of 5 positions shown; findings below may reference images not displayed]

ACR Breast Density Category c: The breast tissue is heterogeneously
dense, which may obscure small masses.
FINDINGS: There are no findings suspicious for malignancy. Images were
processed with CAD.
IMPRESSION: No mammographic evidence of malignancy. A result letter of this
screening mammogram will be mailed directly to the patient.

RECOMMENDATION:
Screening mammogram in one year. (Code:[id number])

BI-RADS CATEGORY  1: Negative.

## 2020-06-03 ENCOUNTER — Ambulatory Visit (HOSPITAL_COMMUNITY)
Admission: EM | Admit: 2020-06-03 | Discharge: 2020-06-03 | Disposition: A | Discharge status: Home / Self Care | Payer: 59 | Attending: Family Medicine | Admitting: Family Medicine

## 2020-06-03 ENCOUNTER — Encounter (HOSPITAL_COMMUNITY): Payer: Self-pay | Admitting: Emergency Medicine

## 2020-06-03 ENCOUNTER — Ambulatory Visit (INDEPENDENT_AMBULATORY_CARE_PROVIDER_SITE_OTHER): Payer: 59

## 2020-06-03 ENCOUNTER — Other Ambulatory Visit: Payer: Self-pay

## 2020-06-03 DIAGNOSIS — R079 Chest pain, unspecified: Secondary | ICD-10-CM

## 2020-06-03 DIAGNOSIS — R1013 Epigastric pain: Secondary | ICD-10-CM

## 2020-06-03 DIAGNOSIS — K2100 Gastro-esophageal reflux disease with esophagitis, without bleeding: Secondary | ICD-10-CM | POA: Diagnosis not present

## 2020-06-03 MED ORDER — ALUM & MAG HYDROXIDE-SIMETH 200-200-20 MG/5ML PO SUSP
30.0000 mL | Freq: Once | ORAL | Status: AC
  Administered 2020-06-03: 30 mL via ORAL

## 2020-06-03 MED ORDER — LIDOCAINE VISCOUS HCL 2 % MT SOLN
15.0000 mL | Freq: Once | OROMUCOSAL | Status: AC
  Administered 2020-06-03: 15 mL via ORAL

## 2020-06-03 MED ORDER — ALUM & MAG HYDROXIDE-SIMETH 200-200-20 MG/5ML PO SUSP
ORAL | Status: AC
  Filled 2020-06-03: qty 30

## 2020-06-03 MED ORDER — OMEPRAZOLE 20 MG PO CPDR: 20.0000 mg | DELAYED_RELEASE_CAPSULE | Freq: Every day | ORAL | 0 refills | Status: DC

## 2020-06-03 MED ORDER — LIDOCAINE VISCOUS HCL 2 % MT SOLN
OROMUCOSAL | Status: AC
  Filled 2020-06-03: qty 15

## 2020-06-03 NOTE — ED Provider Notes (Signed)
[facility]   [id number] 06/03/20 Arrival Time: 8119  ASSESSMENT & PLAN:  1. Chest pain, unspecified type     Reports significant relief of symptoms after GI cocktail here.  Meds ordered this encounter  Medications  . AND Linked Order Group   . alum & mag hydroxide-simeth (MAALOX/MYLANTA) 200-200-20 MG/5ML suspension 30 mL   . lidocaine (XYLOCAINE) 2 % viscous mouth solution 15 mL  . omeprazole (PRILOSEC) 20 MG capsule    Sig: Take 1 capsule (20 mg total) by mouth daily.    Dispense:  30 capsule    Refill:  0    ECG: Performed today and interpreted by me: normal sinus rhythm; isolated non-spec ST abnormality in V1 that is similar to past ECG.  I have personally viewed the imaging studies ordered this visit. CXR: No acute lung findings.  Chest pain precautions discussed. Reviewed expectations re: course of current medical issues. Questions answered. Outlined signs and symptoms indicating need for more acute intervention. Patient verbalized understanding. After Visit Summary given.   SUBJECTIVE:  History from: patient. Vicki Brown is a 60 y.o. female who reports epigastric discomfort wth occasional anterior midline "burning pain that goes into my throat". H/O similar before that resolved with acid medications. No current tx. No assoc n/v/SOB/diaphoresis. Does belch more freq than usual. No LE edema or calf swelling. Normal PO intake. Ambulatory without difficulty. No reported back pain. Agrees symptoms more prominent post-prandial.  Social History   Tobacco Use  Smoking Status Former Smoker  . Packs/day: 2.00  . Years: 20.00  . Pack years: 40.00  . Types: Cigarettes  . Quit date: 2004  . Years since quitting: 18.2  Smokeless Tobacco Never Used  Tobacco Comment   20 years ago   Social History   Substance and Sexual Activity  Alcohol Use Yes  . Alcohol/week: 6.0 standard drinks  . Types: 6 Cans of beer per week    OBJECTIVE:  Vitals:    06/03/20 1239  BP: 132/82  Pulse: 78  Resp: 18  Temp: 98.6 F (37 C)  TempSrc: Oral  SpO2: 97%    General appearance: alert, oriented, no acute distress; obese Eyes: PERRLA; EOMI; conjunctivae normal HENT: normocephalic; atraumatic Neck: supple with FROM Lungs: without labored respirations; speaks full sentences without difficulty; CTAB Heart: regular rate and rhythm without murmer Chest Wall: without tenderness to palpation Abdomen: soft, non-tender; no guarding or rebound tenderness Extremities: without edema; without calf swelling or tenderness; symmetrical without gross deformities Skin: warm and dry; without rash or lesions Neuro: normal gait Psychological: alert and cooperative; normal mood and affect   Imaging: DG Chest 2 View  Result Date: 06/03/2020 CLINICAL DATA:  Mid chest pain, worse with inspiration. Reflux. Pain radiates to both sides. EXAM: CHEST - 2 VIEW COMPARISON:  06/15/2018. FINDINGS: Trachea is midline. Heart is enlarged. Biapical pleural thickening. Lungs are clear. No pleural fluid. Large hiatal hernia. IMPRESSION: 1. No acute findings. 2. Large hiatal hernia. Electronically Signed   By: Lorin Picket M.D.   On: 06/03/2020 13:04    No Known Allergies  Past Medical History:  Diagnosis Date  . Allergy   . HIV infection ([place])   . Hyperlipidemia   . Hypertension   . Insomnia 12/19/2014   Social History   Socioeconomic History  . Marital status: Single    Spouse name: Not on file  . Number of children: Not on file  . Years of education: Not on file  . Highest education level:  Not on file  Occupational History  . Not on file  Tobacco Use  . Smoking status: Former Smoker    Packs/day: 2.00    Years: 20.00    Pack years: 40.00    Types: Cigarettes    Quit date: 2004    Years since quitting: 18.2  . Smokeless tobacco: Never Used  . Tobacco comment: 20 years ago  Vaping Use  . Vaping Use: Never used  Substance and Sexual Activity  . Alcohol  use: Yes    Alcohol/week: 6.0 standard drinks    Types: 6 Cans of beer per week  . Drug use: Yes    Frequency: 0.5 times per week    Types: Marijuana  . Sexual activity: Yes    Partners: Male    Birth control/protection: Condom    Comment: accepted condoms  Other Topics Concern  . Not on file  Social History Narrative  . Not on file   Social Determinants of Health   Financial Resource Strain: Not on file  Food Insecurity: Not on file  Transportation Needs: Not on file  Physical Activity: Not on file  Stress: Not on file  Social Connections: Not on file  Intimate Partner Violence: Not on file   Family History  Problem Relation Age of Onset  . Cancer Mother        uterine vs ovarian?  . Seizures Father   . Mental illness Brother   . Breast cancer Maternal Aunt   . Diabetes Daughter   . Colon polyps Neg Hx   . Esophageal cancer Neg Hx   . Rectal cancer Neg Hx   . Stomach cancer Neg Hx   . Thyroid disease Neg Hx    Past Surgical History:  Procedure Laterality Date  . Broken Arm Left    Pins placed in arm  . TUBAL LIGATION       Vanessa Kick, MD 06/05/20 [phone]

## 2020-06-03 NOTE — ED Triage Notes (Addendum)
Pt sts mid chest pain worse with inspiration and sts taking meds for reflux without relief; pt sts pain radiates to both sides; pt sts seen here for similar in past and given meds

## 2020-07-12 ENCOUNTER — Other Ambulatory Visit (HOSPITAL_COMMUNITY)
Admission: RE | Admit: 2020-07-12 | Discharge: 2020-07-12 | Disposition: A | Discharge status: Home / Self Care | Payer: 59 | Source: Ambulatory Visit | Attending: Infectious Diseases | Admitting: Infectious Diseases

## 2020-07-12 ENCOUNTER — Encounter: Payer: Self-pay | Admitting: Infectious Diseases

## 2020-07-12 ENCOUNTER — Ambulatory Visit (INDEPENDENT_AMBULATORY_CARE_PROVIDER_SITE_OTHER): Payer: 59 | Admitting: Infectious Diseases

## 2020-07-12 ENCOUNTER — Other Ambulatory Visit: Payer: Self-pay

## 2020-07-12 VITALS — BP 112/76 | HR 82 | Temp 98.2°F | Ht 66.0 in | Wt 168.0 lb

## 2020-07-12 DIAGNOSIS — K209 Esophagitis, unspecified without bleeding: Secondary | ICD-10-CM | POA: Diagnosis not present

## 2020-07-12 DIAGNOSIS — B20 Human immunodeficiency virus [HIV] disease: Secondary | ICD-10-CM | POA: Diagnosis not present

## 2020-07-12 DIAGNOSIS — Z113 Encounter for screening for infections with a predominantly sexual mode of transmission: Secondary | ICD-10-CM | POA: Insufficient documentation

## 2020-07-12 DIAGNOSIS — I4811 Longstanding persistent atrial fibrillation: Secondary | ICD-10-CM

## 2020-07-12 DIAGNOSIS — E559 Vitamin D deficiency, unspecified: Secondary | ICD-10-CM

## 2020-07-12 DIAGNOSIS — R5383 Other fatigue: Secondary | ICD-10-CM | POA: Diagnosis not present

## 2020-07-12 MED ORDER — OMEPRAZOLE 20 MG PO CPDR
DELAYED_RELEASE_CAPSULE | ORAL | 0 refills | Status: DC
Start: 2020-07-12 — End: 2023-05-02

## 2020-07-12 MED ORDER — OMEPRAZOLE 20 MG PO CPDR: DELAYED_RELEASE_CAPSULE | ORAL | 0 refills | Status: DC

## 2020-07-12 NOTE — Progress Notes (Signed)
Name: Vicki Brown  DOB: 1960-03-23 MRN: [id number] PCP: Patient, No Pcp Per (Inactive)      Brief Narrative:  Vicki Brown is a 60 y.o. female with HIV infection, Dx March 2009. History of OIs: none   Previous Regimens: . Atripla . Biktarvy  Genotypes: . No RT/PI mutations in past   Subjective:  CC:  HIV follow up care 1 year follow up    HPI: Vicki Brown is doing well. Gets pretty tired quick now a days - after dressing, tying shoes and doing tasks at home.  Vicki Brown does not feel like Vicki Brown is short of breath, describes as mostly to be fatigued.  No fevers, chills or sweating.  Vicki Brown has not had any weight loss to her knowledge.  Having trouble sleeping at night for various reasons. Her pharmacy said Vicki Brown has to pay for her xarelto now >> has a few left as of now. Vicki Brown has not called the cardiology clinic to discuss further.  Vicki Brown was unaware that Vicki Brown was supposed to go for a visit and December.  Doing well with Biktarvy once daily. Vicki Brown has not missed any doses and no trouble with access to medication. No side effects that Vicki Brown has noticed.  Requesting routine urine testing given Vicki Brown has a new partner.  No symptoms to report of concern.  Vicki Brown went to the emergency room/urgent care recently for severe chest pain due to reflux.  Vicki Brown was unable to afford the omeprazole that was prescribed to her and has been "taking Tums and Rolaids like candy." Vicki Brown has tried to make some reductions in some triggering foods such as chocolate, caffeine but Vicki Brown cannot quite take the coffee in the morning.    Review of Systems  Constitutional: Positive for malaise/fatigue. Negative for chills and fever.  HENT: Negative for tinnitus.   Eyes: Negative for blurred vision and photophobia.  Respiratory: Negative for cough and sputum production.   Cardiovascular: Negative for chest pain.  Gastrointestinal: Positive for heartburn. Negative for diarrhea, nausea and vomiting.  Genitourinary: Negative for dysuria.   Skin: Negative for rash.  Neurological: Negative for headaches.    Past Medical History:  Diagnosis Date  . Allergy   . HIV infection ([place])   . Hyperlipidemia   . Hypertension   . Insomnia 12/19/2014    Outpatient Medications Prior to Visit  Medication Sig Dispense Refill  . acetaminophen (TYLENOL) 500 MG tablet Take 2 tablets (1,000 mg total) by mouth every 8 (eight) hours as needed. 30 tablet 0  . bictegravir-emtricitabine-tenofovir AF (BIKTARVY) 50-200-25 MG TABS tablet Take 1 tablet by mouth daily. 30 tablet 11  . HYDROcodone-homatropine (HYCODAN) 5-1.5 MG/5ML syrup Take 5 mLs by mouth every 6 (six) hours as needed for cough. 90 mL 0  . ibuprofen (ADVIL,MOTRIN) 800 MG tablet TAKE 1 TABLET(800 MG) BY MOUTH TWICE DAILY WITH FOOD AS NEEDED 30 tablet 0  . lisinopril-hydrochlorothiazide (ZESTORETIC) 10-12.5 MG tablet TAKE 1 TABLET BY MOUTH DAILY 90 tablet 1  . rivaroxaban (XARELTO) 20 MG TABS tablet Take 1 tablet (20 mg total) by mouth daily with supper. 30 tablet 11  . traZODone (DESYREL) 50 MG tablet Take 0.5 tablets (25 mg total) by mouth at bedtime. 30 tablet 3  . metoprolol succinate (TOPROL XL) 25 MG 24 hr tablet Take 1 tablet (25 mg total) by mouth daily. 90 tablet 3  . omeprazole (PRILOSEC) 20 MG capsule Take 1 capsule (20 mg total) by mouth daily. (Patient not taking: Reported on 07/12/2020) 30 capsule  0   Facility-Administered Medications Prior to Visit  Medication Dose Route Frequency Provider Last Rate Last Admin  . 0.9 %  sodium chloride infusion  500 mL Intravenous Continuous Danis, Kirke Corin, MD         No Known Allergies  Social History   Tobacco Use  . Smoking status: Former Smoker    Packs/day: 2.00    Years: 20.00    Pack years: 40.00    Types: Cigarettes    Quit date: 2004    Years since quitting: 18.3  . Smokeless tobacco: Never Used  . Tobacco comment: 20 years ago  Vaping Use  . Vaping Use: Never used  Substance Use Topics  . Alcohol use: Yes     Alcohol/week: 6.0 standard drinks    Types: 6 Cans of beer per week  . Drug use: Yes    Frequency: 0.5 times per week    Types: Marijuana    Family History  Problem Relation Age of Onset  . Cancer Mother        uterine vs ovarian?  . Seizures Father   . Mental illness Brother   . Breast cancer Maternal Aunt   . Diabetes Daughter   . Colon polyps Neg Hx   . Esophageal cancer Neg Hx   . Rectal cancer Neg Hx   . Stomach cancer Neg Hx   . Thyroid disease Neg Hx     Social History   Substance and Sexual Activity  Sexual Activity Yes  . Partners: Male  . Birth control/protection: Condom   Comment: declined condoms 07/12/20     Objective:   Vitals:   07/12/20 1134  BP: 112/76  Pulse: 82  Temp: 98.2 F (36.8 C)  TempSrc: Oral  SpO2: 99%  Weight: 168 lb (76.2 kg)  Height: 5\' 6"  (1.676 m)   Body mass index is 27.12 kg/m.  Physical Exam Vitals reviewed.  HENT:     Mouth/Throat:     Mouth: No oral lesions.     Dentition: No dental abscesses.  Cardiovascular:     Rate and Rhythm: Normal rate and regular rhythm.     Heart sounds: Normal heart sounds. No murmur heard.   Pulmonary:     Effort: Pulmonary effort is normal.     Breath sounds: Normal breath sounds.  Abdominal:     General: There is no distension.     Palpations: Abdomen is soft.     Tenderness: There is no abdominal tenderness.  Musculoskeletal:        General: No tenderness. Normal range of motion.  Lymphadenopathy:     Cervical: No cervical adenopathy.  Skin:    General: Skin is warm and dry.     Findings: No rash.  Neurological:     Mental Status: Vicki Brown is alert and oriented to person, place, and time.  Psychiatric:        Judgment: Judgment normal.      Lab Results Lab Results  Component Value Date   WBC 3.1 (L) 02/07/2020   HGB 10.4 (L) 02/07/2020   HCT 30.6 (L) 02/07/2020   MCV 96.8 02/07/2020   PLT 227 02/07/2020    Lab Results  Component Value Date   CREATININE 0.95  02/07/2020   BUN 11 02/07/2020   NA 139 02/07/2020   K 4.1 02/07/2020   CL 108 02/07/2020   CO2 26 02/07/2020    Lab Results  Component Value Date   ALT 61 (H) 02/07/2020  AST 64 (H) 02/07/2020   ALKPHOS 55 05/12/2019   BILITOT 0.3 02/07/2020    Lab Results  Component Value Date   CHOL 195 02/07/2020   HDL 70 02/07/2020   LDLCALC 110 (H) 02/07/2020   TRIG 64 02/07/2020   CHOLHDL 2.8 02/07/2020   HIV 1 RNA Quant  Date Value  02/07/2020 <20 Copies/mL  04/13/2019 <20 NOT DETECTED copies/mL  03/31/2018 30 copies/mL (H)   CD4 T Cell Abs (/uL)  Date Value  02/07/2020 957  04/13/2019 907  03/31/2018 780     Assessment & Plan:   Problem List Items Addressed This Visit      Unprioritized   Human immunodeficiency virus (HIV) disease ([place]) (Chronic)    Well-controlled on once daily Biktarvy.  Unfortunately Vicki Brown has been taking too many Rolaids/Tums that are going to put her at risk for decreased absorption.  We discussed this today and counseled to separate any Tums or Rolaids by 6 hours after or 2 hours before Vicki Brown doses her Biktarvy.  Preferably would like to get her on PPI. Pap is up-to-date, COVID vaccines as well as other vaccines are up-to-date Will give information to try to get her into see a primary care doctor. Return in about 6 months (around 01/12/2021).       Relevant Orders   T-helper cells (CD4) count   Fatigue    Relatively acute and persistent.  No concerning features for any specific process.  We will check her vitamin D level as well as hemoglobin, HIV pertinent labs.  Cardiology follow-up.  Referral to PCP recommended as well.      Relevant Orders   Vitamin D (25 hydroxy)   Esophagitis    Based on her description of severe pain I do suspect Vicki Brown is got some esophagitis in the setting of reflux.  Spent some time looking at good Rx and will send a prescription to start twice daily PPI before meals x4 weeks and then reduce to once daily x4 weeks.       Relevant Medications   omeprazole (PRILOSEC) 20 MG capsule   Atrial fibrillation (HCC)    Heart rate sounds pretty regular today without any premature beats appreciated during assessment.  Vicki Brown has not had a follow-up with her cardiology team in a while.  I will see if I can message one of the providers at the clinic requesting assistance with Xarelto samples for her.  I also gave her the number to call for an appointment hopefully within the next 2 weeks.  Vicki Brown is having fatigue of uncertain origin at this time and would like cardiology to ensure nothing concerning on their end.       Other Visit Diagnoses    Routine screening for STI (sexually transmitted infection)    -  Primary   Relevant Orders   Urine cytology ancillary only      Janene Madeira, MSN, NP-C [place] [facility] Pager: [phone] Office: [phone]  07/12/20  2:50 PM

## 2020-07-12 NOTE — Assessment & Plan Note (Signed)
Based on her description of severe pain I do suspect she is got some esophagitis in the setting of reflux.  Spent some time looking at good Rx and will send a prescription to start twice daily PPI before meals x4 weeks and then reduce to once daily x4 weeks.

## 2020-07-12 NOTE — Patient Instructions (Addendum)
Nice to see you   Please try to avoid taking Biktarvy with the Tums or Rolaids - it can make your biktary not absorb good enough to work.   If you need the Tums, don't take your first one until 6 hours after you take the biktarvy or 2 hours before you take your biktarvy.   Will send in a medication for Omeprazole to [facility] for the best price with GoodRx coupon - this means you won't file it under your insurance.    Call the Heart Team to schedule a follow up to help get your [place] [place], [place], [place]  [address] Phone: [phone]   For primary care services, please call one of the following clinics for a new patient appointment:   1. Internal Medicine Clinic - ground floor of [facility]. [phone]  2. [facility] [place]  [phone]  3. Patient [facility] in [place]

## 2020-07-12 NOTE — Assessment & Plan Note (Signed)
Well-controlled on once daily Biktarvy.  Unfortunately she has been taking too many Rolaids/Tums that are going to put her at risk for decreased absorption.  We discussed this today and counseled to separate any Tums or Rolaids by 6 hours after or 2 hours before she doses her Biktarvy.  Preferably would like to get her on PPI. Pap is up-to-date, COVID vaccines as well as other vaccines are up-to-date Will give information to try to get her into see a primary care doctor. Return in about 6 months (around 01/12/2021).

## 2020-07-12 NOTE — Assessment & Plan Note (Signed)
Heart rate sounds pretty regular today without any premature beats appreciated during assessment.  She has not had a follow-up with her cardiology team in a while.  I will see if I can message one of the providers at the clinic requesting assistance with Xarelto samples for her.  I also gave her the number to call for an appointment hopefully within the next 2 weeks.  She is having fatigue of uncertain origin at this time and would like cardiology to ensure nothing concerning on their end.

## 2020-07-12 NOTE — Assessment & Plan Note (Signed)
Relatively acute and persistent.  No concerning features for any specific process.  We will check her vitamin D level as well as hemoglobin, HIV pertinent labs.  Cardiology follow-up.  Referral to PCP recommended as well.

## 2020-07-15 ENCOUNTER — Other Ambulatory Visit: Payer: Self-pay | Admitting: Pharmacist

## 2020-07-15 LAB — VITAMIN D 25 HYDROXY (VIT D DEFICIENCY, FRACTURES): Vit D, 25-Hydroxy: 15 ng/mL — ABNORMAL LOW (ref 30–100)

## 2020-07-15 LAB — CBC WITH DIFFERENTIAL/PLATELET
Absolute Monocytes: 278 cells/uL (ref 200–950)
Basophils Absolute: 20 cells/uL (ref 0–200)
Basophils Relative: 0.7 %
Eosinophils Absolute: 49 cells/uL (ref 15–500)
Eosinophils Relative: 1.7 %
HCT: 33.6 % — ABNORMAL LOW (ref 35.0–45.0)
Hemoglobin: 10.7 g/dL — ABNORMAL LOW (ref 11.7–15.5)
Lymphs Abs: 1375 cells/uL (ref 850–3900)
MCH: 28.8 pg (ref 27.0–33.0)
MCHC: 31.8 g/dL — ABNORMAL LOW (ref 32.0–36.0)
MCV: 90.3 fL (ref 80.0–100.0)
MPV: 9.4 fL (ref 7.5–12.5)
Monocytes Relative: 9.6 %
Neutro Abs: 1177 cells/uL — ABNORMAL LOW (ref 1500–7800)
Neutrophils Relative %: 40.6 %
Platelets: 243 10*3/uL (ref 140–400)
RBC: 3.72 10*6/uL — ABNORMAL LOW (ref 3.80–5.10)
RDW: 14.2 % (ref 11.0–15.0)
Total Lymphocyte: 47.4 %
WBC: 2.9 10*3/uL — ABNORMAL LOW (ref 3.8–10.8)

## 2020-07-15 LAB — URINE CYTOLOGY ANCILLARY ONLY
Chlamydia: NEGATIVE
Comment: NEGATIVE
Comment: NEGATIVE
Comment: NORMAL
Neisseria Gonorrhea: NEGATIVE
Trichomonas: NEGATIVE

## 2020-07-15 LAB — COMPLETE METABOLIC PANEL WITH GFR
AG Ratio: 1.1 (calc) (ref 1.0–2.5)
ALT: 47 U/L — ABNORMAL HIGH (ref 6–29)
AST: 56 U/L — ABNORMAL HIGH (ref 10–35)
Albumin: 3.9 g/dL (ref 3.6–5.1)
Alkaline phosphatase (APISO): 69 U/L (ref 37–153)
BUN/Creatinine Ratio: 10 (calc) (ref 6–22)
BUN: 11 mg/dL (ref 7–25)
CO2: 25 mmol/L (ref 20–32)
Calcium: 9.4 mg/dL (ref 8.6–10.4)
Chloride: 107 mmol/L (ref 98–110)
Creat: 1.1 mg/dL — ABNORMAL HIGH (ref 0.50–0.99)
GFR, Est African American: 63 mL/min/{1.73_m2} (ref >=60)
GFR, Est Non African American: 54 mL/min/{1.73_m2} — ABNORMAL LOW (ref >=60)
Globulin: 3.5 g/dL (calc) (ref 1.9–3.7)
Glucose, Bld: 77 mg/dL (ref 65–99)
Potassium: 4 mmol/L (ref 3.5–5.3)
Sodium: 140 mmol/L (ref 135–146)
Total Bilirubin: 0.2 mg/dL (ref 0.2–1.2)
Total Protein: 7.4 g/dL (ref 6.1–8.1)

## 2020-07-15 LAB — T-HELPER CELLS (CD4) COUNT (NOT AT ARMC)
Absolute CD4: 924 cells/uL (ref 490–1740)
CD4 T Helper %: 64 % — ABNORMAL HIGH (ref 30–61)
Total lymphocyte count: 1442 cells/uL (ref 850–3900)

## 2020-07-15 LAB — HIV-1 RNA QUANT-NO REFLEX-BLD
HIV 1 RNA Quant: NOT DETECTED Copies/mL
HIV-1 RNA Quant, Log: NOT DETECTED Log cps/mL

## 2020-07-15 LAB — RPR: RPR Ser Ql: NONREACTIVE

## 2020-07-15 MED ORDER — RIVAROXABAN 20 MG PO TABS: 20.0000 mg | ORAL_TABLET | Freq: Every day | ORAL | 4 refills | Status: DC

## 2020-07-15 NOTE — Telephone Encounter (Signed)
Prescription refill request for Xarelto received.  Indication:afib Last office visit:08/09/19 Weight:76.2 Age:60 Scr:1.1 on 07/12/20 CrCl:64 ml/min

## 2020-07-19 ENCOUNTER — Other Ambulatory Visit: Payer: Self-pay | Admitting: Family Medicine

## 2020-07-19 DIAGNOSIS — Z1231 Encounter for screening mammogram for malignant neoplasm of breast: Secondary | ICD-10-CM

## 2020-07-22 ENCOUNTER — Telehealth: Payer: Self-pay

## 2020-07-22 MED ORDER — VITAMIN D (ERGOCALCIFEROL) 50000 UNITS PO CAPS: 1.0000 | ORAL_CAPSULE | ORAL | 0 refills | Status: AC

## 2020-07-22 NOTE — Telephone Encounter (Signed)
-----   Message from [place] Callas, NP sent at 07/22/2020  3:57 PM EDT ----- Please call Vicki Brown to let her know that her viral load is undetectable. Her vitamin D levels are very low also. I would like to start her on a once weekly replacement for 2 months. Then she needs to start on a daily supplement of 2000 units. She can pick this up from the pharmacy.  Her liver function tests are a little elevated - I really would like for her to establish care with the Internal Medicine Clinic to help [place] evaluate anything concerning. Can we please provide her with the information to set up an appointment?  Thank you

## 2020-07-22 NOTE — Progress Notes (Signed)
Please call Vicki Brown to let her know that her viral load is undetectable. Her vitamin D levels are very low also. I would like to start her on a once weekly replacement for 2 months. Then she needs to start on a daily supplement of 2000 units. She can pick this up from the pharmacy.  Her liver function tests are a little elevated - I really would like for her to establish care with the Internal Medicine Clinic to help [place] evaluate anything concerning. Can we please provide her with the information to set up an appointment?  Thank you

## 2020-07-22 NOTE — Addendum Note (Signed)
Addended by: [place] Callas on: 07/22/2020 03:58 PM   Modules accepted: Orders

## 2020-07-22 NOTE — Telephone Encounter (Signed)
Relayed test results to patient. Instructed to pick up vitamin d supplements from pharmacy. No further questions/concerns at this time. Provided her with the number for Internal Medicine and instructed her to follow up with them for primary care.   Lyndell Gillyard Lorita Officer, RN

## 2020-08-07 ENCOUNTER — Ambulatory Visit (INDEPENDENT_AMBULATORY_CARE_PROVIDER_SITE_OTHER): Payer: 59 | Admitting: Student

## 2020-08-07 ENCOUNTER — Encounter: Payer: Self-pay | Admitting: Student

## 2020-08-07 VITALS — BP 142/83 | HR 77 | Ht 66.0 in | Wt 166.8 lb

## 2020-08-07 DIAGNOSIS — R5383 Other fatigue: Secondary | ICD-10-CM | POA: Diagnosis not present

## 2020-08-07 DIAGNOSIS — R945 Abnormal results of liver function studies: Secondary | ICD-10-CM | POA: Diagnosis not present

## 2020-08-07 DIAGNOSIS — R7989 Other specified abnormal findings of blood chemistry: Secondary | ICD-10-CM

## 2020-08-07 DIAGNOSIS — Z0000 Encounter for general adult medical examination without abnormal findings: Secondary | ICD-10-CM | POA: Diagnosis not present

## 2020-08-07 DIAGNOSIS — I10 Essential (primary) hypertension: Secondary | ICD-10-CM

## 2020-08-07 DIAGNOSIS — D509 Iron deficiency anemia, unspecified: Secondary | ICD-10-CM

## 2020-08-07 DIAGNOSIS — Z23 Encounter for immunization: Secondary | ICD-10-CM

## 2020-08-07 NOTE — Assessment & Plan Note (Addendum)
Ms. Vicki Brown was referred to our clinic for primary care needs and for evaluation of elevated liver function tests. Ms. Vicki Brown states she has never been diagnosed with liver disease and has no knowledge of any family history of liver disease. She reports daily alcohol consumption, roughly 4-5 beers daily, for about the last 40 years. She previously smoked intermittently and quit ~30 years ago. She denies any ilicit drug use. Ms. Vicki Brown also reports she has been sexually active with one man for the past two months with intermittent use of protection. Last STI check in December with Infectious Disease. She is currently asymptomatic, denying abdominal pain, nausea, vomiting, constipation, or diarrhea.   A/P: Unknown etiology for Ms. Vicki Brown elevated LFT's. She does drink alcohol daily for the last forty years, although the pattern of LFT elevation does not correlate with alcohol-induced hepatitis. On lab work last month platelets appear stable and within normal range and she is currently asymptomatic without signs or symptoms of systematic liver disease or hyperbilirubinemia. She is hepatitis A and B immune and without major risk factors for hepatitis C. Will repeat LFT's today and consider further work-up pending these. - Follow-up CMP    ADDENDUM: LFT's similar to previous, AST 70, ALT 49. Possibly elevated LFT's related to chronic alcohol use vs Biktarvy? Will discuss with ID if Biktarvy can cause this rise in LFT's. As patient is currently asymptomatic without any obvious cause, will continue to monitor and follow these in the next six months.

## 2020-08-07 NOTE — Assessment & Plan Note (Signed)
-   Patient has received three vaccines against COVID-19. Will discuss with infectious disease whether a fourth dose would be appropriate.  - Last colonoscopy in 2018, next due in 2023  - Next mammogram scheduled for next month, July 2022.  - Last PAP smear in December 2021  - Unknown last Tdap, will receive dose today.

## 2020-08-07 NOTE — Progress Notes (Signed)
   CC: fatigue  HPI:  Vicki Brown is a 60 y.o. with hypertension, well-controlled HIV, paroxysmal atrial fibrillation on Xarelto presenting to [facility] to establish care.   Please see problem-based list for further details.  Past Medical History:  Diagnosis Date  . Allergy   . HIV infection ([place])   . Hyperlipidemia   . Hypertension   . Insomnia 12/19/2014   Review of Systems:  As per HPI  Physical Exam:  Vitals:   08/07/20 0902  BP: (!) 142/83  Pulse: 77  SpO2: 99%  Weight: 166 lb 12.8 oz (75.7 kg)  Height: 5\' 6"  (1.676 m)   General: Well-appearing, resting in no acute distress HENT: Normocephalic, atraumatic. CV: Regular rate, rhythm. No murmurs, rubs, gallops. Warm extremities. Pulm: Normal work of breathing on room air. Clear to auscultation bilaterally. GI: Soft, non-tender, non-distended. Normoactive bowel sounds. No hepatomegaly appreciated. MSK: Normal bulk, tone. No pitting edema bilaterally. Neuro: Awake, alert, answering questions appropriately. Psych: Normal mood, affect, speech.  Assessment & Plan:   See Encounters Tab for problem based charting.  Patient discussed with Dr. Jimmye Norman

## 2020-08-07 NOTE — Assessment & Plan Note (Signed)
Ms. Deguire reports that she has been experiencing consistent fatigue, unknown exactly when this began. Denies any skin changes or heat/cold intolerance. She says her appetite is "so-so" but denies any recent weight gain or weight loss. She does have a history of vitamin D deficiency.   A/P: Per chart review appears her hemoglobin has steadily dropped over the last few months along with her MCV. Will plan to repeat CBC with iron studies and re-check TSH today. She last had colonoscopy in 2018, no signs or symptoms of current bleeding. - CBC, iron studies, TSH today - Repeat in 6 months as indicated

## 2020-08-07 NOTE — Assessment & Plan Note (Addendum)
BP Readings from Last 3 Encounters:  08/07/20 (!) 142/83  07/12/20 112/76  06/03/20 132/82   Ms. Demattia reports that she did not take her blood pressure medications this morning. States that she rarely misses any doses. Denies chest pain, dyspnea, headaches, blurry vision, leg edema. Encouraged patient to take her medications prior to next visit so we can accurately determine her normal blood pressure on medications.  Will continue with lisinopril-HCTZ 10-12.5mg  daily and metoprolol 25mg  daily and have her follow-up in six months.

## 2020-08-07 NOTE — Patient Instructions (Addendum)
Vicki Brown,  It was a pleasure seeing you today!  Today we discussed your liver function tests and fatigue you have been experiencing. We are going to repeat your liver tests today and check your iron and thyroid levels. I will call you tomorrow with the results. Depending on those results, we might ask for you to return for further blood work.   Please make sure to follow-up with your infectious disease doctor and your mammogram next month.  Please make sure to arrive 15 minutes prior to your next appointment. If you arrive late, you may be asked to reschedule.   We look forward to seeing you next time. Please call our clinic at [phone] if you have any questions or concerns. The best time to call is Monday-Friday from 9am-4pm, but there is someone available 24/7. If after hours or the weekend, call the main hospital number and ask for the Internal Medicine Resident On-Call. If you need medication refills, please notify your pharmacy one week in advance and they will send [place] a request.  Thank you for letting us take part in your care. Wishing you the best!  Thank you, Dr. Sanjuan Dame, MD

## 2020-08-08 ENCOUNTER — Other Ambulatory Visit: Payer: Self-pay | Admitting: Student

## 2020-08-08 DIAGNOSIS — D509 Iron deficiency anemia, unspecified: Secondary | ICD-10-CM

## 2020-08-08 LAB — CMP14 + ANION GAP
ALT: 49 IU/L — ABNORMAL HIGH (ref 0–32)
AST: 70 IU/L — ABNORMAL HIGH (ref 0–40)
Albumin/Globulin Ratio: 1.2 (ref 1.2–2.2)
Albumin: 4.2 g/dL (ref 3.8–4.8)
Alkaline Phosphatase: 92 IU/L (ref 44–121)
Anion Gap: 17 mmol/L (ref 10.0–18.0)
BUN/Creatinine Ratio: 14 (ref 12–28)
BUN: 13 mg/dL (ref 8–27)
Bilirubin Total: <0.2 mg/dL (ref 0.0–1.2)
CO2: 19 mmol/L — ABNORMAL LOW (ref 20–29)
Calcium: 9.3 mg/dL (ref 8.7–10.3)
Chloride: 102 mmol/L (ref 96–106)
Creatinine, Ser: 0.96 mg/dL (ref 0.57–1.00)
Globulin, Total: 3.5 g/dL (ref 1.5–4.5)
Glucose: 89 mg/dL (ref 65–99)
Potassium: 4.1 mmol/L (ref 3.5–5.2)
Sodium: 138 mmol/L (ref 134–144)
Total Protein: 7.7 g/dL (ref 6.0–8.5)
eGFR: 67 mL/min/{1.73_m2} (ref >59)

## 2020-08-08 LAB — ANEMIA PROFILE B
Basophils Absolute: 0 10*3/uL (ref 0.0–0.2)
Basos: 1 %
EOS (ABSOLUTE): 0.1 10*3/uL (ref 0.0–0.4)
Eos: 3 %
Ferritin: 13 ng/mL — ABNORMAL LOW (ref 15–150)
Folate: 14.6 ng/mL (ref >3.0)
Hematocrit: 32.6 % — ABNORMAL LOW (ref 34.0–46.6)
Hemoglobin: 10.6 g/dL — ABNORMAL LOW (ref 11.1–15.9)
Immature Grans (Abs): 0 10*3/uL (ref 0.0–0.1)
Immature Granulocytes: 0 %
Iron Saturation: 7 % — CL (ref 15–55)
Iron: 36 ug/dL (ref 27–139)
Lymphocytes Absolute: 1.6 10*3/uL (ref 0.7–3.1)
Lymphs: 47 %
MCH: 28.6 pg (ref 26.6–33.0)
MCHC: 32.5 g/dL (ref 31.5–35.7)
MCV: 88 fL (ref 79–97)
Monocytes Absolute: 0.3 10*3/uL (ref 0.1–0.9)
Monocytes: 9 %
Neutrophils Absolute: 1.4 10*3/uL (ref 1.4–7.0)
Neutrophils: 40 %
Platelets: 255 10*3/uL (ref 150–450)
RBC: 3.71 x10E6/uL — ABNORMAL LOW (ref 3.77–5.28)
RDW: 15.1 % (ref 11.7–15.4)
Retic Ct Pct: 1.5 % (ref 0.6–2.6)
Total Iron Binding Capacity: 487 ug/dL — ABNORMAL HIGH (ref 250–450)
UIBC: 451 ug/dL — ABNORMAL HIGH (ref 118–369)
Vitamin B-12: 770 pg/mL (ref 232–1245)
WBC: 3.6 10*3/uL (ref 3.4–10.8)

## 2020-08-08 LAB — TSH: TSH: 1.1 u[IU]/mL (ref 0.450–4.500)

## 2020-08-08 MED ORDER — FERROUS SULFATE 325 (65 FE) MG PO TABS: 325.0000 mg | ORAL_TABLET | Freq: Every day | ORAL | 0 refills | Status: DC

## 2020-08-08 NOTE — Addendum Note (Signed)
Addended by: Marcelino Duster on: 08/08/2020 02:19 PM   Modules accepted: Orders

## 2020-08-08 NOTE — Assessment & Plan Note (Addendum)
Vicki Brown was found to be iron deficient during work-up for her chronic fatigue. Discussed with Vicki Brown that this could be contributing to her symptoms. Will start on iron supplementation.   Patient to take iron supplement with Biktarvy with meals for best absorption. Can follow-up anemia panel in the next year. Of note, patient had colonoscopy in 2018 that revealed 23mm polyp in transverse colon that was resected. She is supposed to have another colonoscopy next year in 2023. Given her chronic alcohol use, can check B12 and folate at next visit. - Start ferrous sulfate 325mg  daily - Follow-up anemia panel 6 months-1 year with folate and B12 - Colonoscopy in 2013

## 2020-08-09 ENCOUNTER — Other Ambulatory Visit: Payer: Self-pay | Admitting: Student

## 2020-08-09 DIAGNOSIS — R945 Abnormal results of liver function studies: Secondary | ICD-10-CM

## 2020-08-09 DIAGNOSIS — R7989 Other specified abnormal findings of blood chemistry: Secondary | ICD-10-CM

## 2020-08-12 NOTE — Progress Notes (Signed)
Internal Medicine Clinic Attending  Case discussed with Dr. Collene Gobble  At the time of the visit.  We reviewed the resident's history and exam and pertinent patient test results.  I agree with the assessment, diagnosis, and plan of care documented in the resident's note.  Iron deficiency noted.  Elevated hepatocellular enzymes may suggest NAFLD.

## 2020-08-20 ENCOUNTER — Other Ambulatory Visit: Payer: Self-pay | Admitting: Infectious Diseases

## 2020-08-20 DIAGNOSIS — B20 Human immunodeficiency virus [HIV] disease: Secondary | ICD-10-CM

## 2020-08-21 ENCOUNTER — Other Ambulatory Visit (INDEPENDENT_AMBULATORY_CARE_PROVIDER_SITE_OTHER): Payer: 59

## 2020-08-21 DIAGNOSIS — R7989 Other specified abnormal findings of blood chemistry: Secondary | ICD-10-CM

## 2020-08-21 DIAGNOSIS — R945 Abnormal results of liver function studies: Secondary | ICD-10-CM | POA: Diagnosis not present

## 2020-08-22 LAB — HEPATITIS B SURFACE ANTIGEN: Hepatitis B Surface Ag: NEGATIVE

## 2020-08-22 LAB — HEPATITIS B SURFACE ANTIBODY,QUALITATIVE: Hep B Surface Ab, Qual: NONREACTIVE

## 2020-08-22 LAB — HEPATITIS C ANTIBODY: Hep C Virus Ab: <0.1 s/co ratio (ref 0.0–0.9)

## 2020-08-22 LAB — HEPATITIS B CORE ANTIBODY, TOTAL: Hep B Core Total Ab: NEGATIVE

## 2020-08-23 LAB — HEPATITIS A ANTIBODY, IGM: Hep A IgM: NEGATIVE

## 2020-08-23 LAB — HAV ANTIBODY W/ RFX: hep A Total Ab: POSITIVE — AB

## 2020-09-10 ENCOUNTER — Encounter: Payer: Self-pay | Admitting: *Deleted

## 2020-09-17 ENCOUNTER — Other Ambulatory Visit: Payer: Self-pay

## 2020-09-17 ENCOUNTER — Ambulatory Visit
Admission: RE | Admit: 2020-09-17 | Discharge: 2020-09-17 | Disposition: A | Discharge status: Home / Self Care | Payer: 59 | Source: Ambulatory Visit | Attending: Family Medicine | Admitting: Family Medicine

## 2020-09-17 DIAGNOSIS — Z1231 Encounter for screening mammogram for malignant neoplasm of breast: Secondary | ICD-10-CM

## 2020-11-04 ENCOUNTER — Other Ambulatory Visit: Payer: Self-pay | Admitting: Student

## 2020-11-04 DIAGNOSIS — D509 Iron deficiency anemia, unspecified: Secondary | ICD-10-CM

## 2020-11-15 ENCOUNTER — Ambulatory Visit: Payer: 59 | Admitting: Family

## 2020-12-23 ENCOUNTER — Other Ambulatory Visit: Payer: Self-pay | Admitting: Infectious Diseases

## 2020-12-23 DIAGNOSIS — I10 Essential (primary) hypertension: Secondary | ICD-10-CM

## 2020-12-23 NOTE — Telephone Encounter (Signed)
Please advise on refill.

## 2021-01-17 ENCOUNTER — Ambulatory Visit: Payer: 59

## 2021-01-17 ENCOUNTER — Other Ambulatory Visit: Payer: Self-pay

## 2021-01-17 ENCOUNTER — Other Ambulatory Visit: Payer: 59

## 2021-01-17 DIAGNOSIS — B20 Human immunodeficiency virus [HIV] disease: Secondary | ICD-10-CM

## 2021-02-03 ENCOUNTER — Telehealth: Payer: Self-pay

## 2021-02-03 ENCOUNTER — Other Ambulatory Visit: Payer: Self-pay

## 2021-02-03 ENCOUNTER — Other Ambulatory Visit: Payer: 59

## 2021-02-03 DIAGNOSIS — Z79899 Other long term (current) drug therapy: Secondary | ICD-10-CM

## 2021-02-03 DIAGNOSIS — B20 Human immunodeficiency virus [HIV] disease: Secondary | ICD-10-CM

## 2021-02-03 NOTE — Telephone Encounter (Signed)
Patient walked into office today stating that she was told to come and get samples when she ran out and that she was working on getting patient assistance. Upon chart review, the last documentation about this was 08/2019. I gave the patient 2 weeks supply of Xarelto and also gave her the information to call for patient assistance. Made her aware that I would forward this message to our PA nurse for review and follow-up.

## 2021-02-04 NOTE — Telephone Encounter (Signed)
**Note De-Identified Dajia Gunnels Obfuscation** No answer so I left a message on the pts VM stating that we cannot provide her Xarelto samples going forward unless she applies for pt asst through [employer]. I did leave [employer] phone number so she can call them with questions about their Xarelto program and her eligibility to be approved for the program.  I did leave my name and office phone number in the VM so she can call me back if she has questions or concerns.

## 2021-02-05 LAB — HIV-1 RNA QUANT-NO REFLEX-BLD
HIV 1 RNA Quant: 26 Copies/mL — ABNORMAL HIGH
HIV-1 RNA Quant, Log: 1.41 Log cps/mL — ABNORMAL HIGH

## 2021-02-05 LAB — LIPID PANEL
Cholesterol: 232 mg/dL — ABNORMAL HIGH (ref <200)
HDL: 81 mg/dL (ref >=50)
LDL Cholesterol (Calc): 126 mg/dL (calc) — ABNORMAL HIGH
Non-HDL Cholesterol (Calc): 151 mg/dL (calc) — ABNORMAL HIGH (ref <130)
Total CHOL/HDL Ratio: 2.9 (calc) (ref <5.0)
Triglycerides: 131 mg/dL (ref <150)

## 2021-02-05 LAB — T-HELPER CELLS (CD4) COUNT (NOT AT ARMC)
Absolute CD4: 910 cells/uL (ref 490–1740)
CD4 T Helper %: 62 % — ABNORMAL HIGH (ref 30–61)
Total lymphocyte count: 1473 cells/uL (ref 850–3900)

## 2021-02-17 ENCOUNTER — Other Ambulatory Visit: Payer: Self-pay

## 2021-02-17 ENCOUNTER — Ambulatory Visit (INDEPENDENT_AMBULATORY_CARE_PROVIDER_SITE_OTHER): Payer: 59 | Admitting: Infectious Diseases

## 2021-02-17 ENCOUNTER — Encounter: Payer: Self-pay | Admitting: Infectious Diseases

## 2021-02-17 ENCOUNTER — Ambulatory Visit: Payer: 59

## 2021-02-17 ENCOUNTER — Ambulatory Visit (INDEPENDENT_AMBULATORY_CARE_PROVIDER_SITE_OTHER): Payer: 59

## 2021-02-17 VITALS — BP 120/83 | HR 80 | Temp 98.2°F | Wt 174.0 lb

## 2021-02-17 DIAGNOSIS — M79602 Pain in left arm: Secondary | ICD-10-CM | POA: Diagnosis not present

## 2021-02-17 DIAGNOSIS — J301 Allergic rhinitis due to pollen: Secondary | ICD-10-CM | POA: Diagnosis not present

## 2021-02-17 DIAGNOSIS — I4811 Longstanding persistent atrial fibrillation: Secondary | ICD-10-CM

## 2021-02-17 DIAGNOSIS — Z23 Encounter for immunization: Secondary | ICD-10-CM | POA: Diagnosis not present

## 2021-02-17 DIAGNOSIS — B20 Human immunodeficiency virus [HIV] disease: Secondary | ICD-10-CM | POA: Diagnosis not present

## 2021-02-17 MED ORDER — BIKTARVY 50-200-25 MG PO TABS: 1.0000 | ORAL_TABLET | Freq: Every day | ORAL | 11 refills | Status: DC

## 2021-02-17 NOTE — Assessment & Plan Note (Signed)
No known injury to provoke and now with 4-days of fairly consistent arm tingling and pain. No weakness on grip exam, no epicondyle symptoms/tenderness. She has pain with lateral abduction and cannot resist against me without pain. Lower posterior cervical spine tenderness and left lateral neck rotational pain. Suspect cervicalgia.  Referred to her PCP assistance with work up. For now OK to take Ibuprofen 400 mg BID short term, however may benefit from steroid (not-diabetic and NSAIDS are not ideal with Biktarvy for potential nephrotoxicity). May need gabapentin or alternative for nerve pain, however.   Appreciate IM clinic assistance. I gave her a work note today that she can return with instructions for no lifting > 20 # or over head reaching/lifting until she has had more appropriate work up and treatment.

## 2021-02-17 NOTE — Assessment & Plan Note (Signed)
Start antihistamine daily - Claritin or allegra or zyrtec.

## 2021-02-17 NOTE — Progress Notes (Signed)
Name: Vicki Brown  DOB: March 19, 1960 MRN: [id number] PCP: Sanjuan Dame, MD     Brief Narrative:  Vicki Brown is a 60 y.o. female with HIV infection, Dx March 2009.  History of OIs: none. HIV Risk Factor: Sexual   Previous Regimens: Atripla Biktarvy  Genotypes: No RT/PI mutations in past   Subjective:  CC:  56M FU  Left arm pain - "feels like someone is pulling it."     HPI: Woke up about 1 week ago and noticed left shoulder pain. She has numbness and tingling in the fingers with this and feels like someone is pulling at the arm. She does not recall any specific injury that caused this. She works in housekeeping at [employer]. Has been out the last 4 days from work. She has been using ibuprofen a few times a day to treat the pain and has helped a little.   Doing well with Biktarvy once daily. She has not missed any doses but has not reapplied for ADAP and this has expired as of October this year. She has about a week of Biktarvy left. No side effects that she has noticed.   No hospital or ER visits in [facility] system per review.  Note re: Xarelto assistance through [employer] - she has not followed up on this but recalls a message she did not save from the cardiology office.    Review of Systems  Constitutional:  Negative for chills, fever, malaise/fatigue and weight loss.  HENT:  Negative for sore throat.   Respiratory:  Negative for cough, sputum production and shortness of breath.   Cardiovascular: Negative.   Gastrointestinal:  Negative for abdominal pain, diarrhea and vomiting.  Musculoskeletal:  Positive for joint pain (Lt shoulder/arm) and neck pain. Negative for myalgias.  Skin:  Negative for rash.  Neurological:  Positive for tingling. Negative for dizziness, sensory change, focal weakness and headaches.  Psychiatric/Behavioral:  Negative for depression and substance abuse. The patient is not nervous/anxious.     Past Medical History:  Diagnosis Date   Allergy     HIV infection ([place])    Hyperlipidemia    Hypertension    Insomnia 12/19/2014    Outpatient Medications Prior to Visit  Medication Sig Dispense Refill   acetaminophen (TYLENOL) 500 MG tablet Take 2 tablets (1,000 mg total) by mouth every 8 (eight) hours as needed. 30 tablet 0   HYDROcodone-homatropine (HYCODAN) 5-1.5 MG/5ML syrup Take 5 mLs by mouth every 6 (six) hours as needed for cough. 90 mL 0   ibuprofen (ADVIL,MOTRIN) 800 MG tablet TAKE 1 TABLET(800 MG) BY MOUTH TWICE DAILY WITH FOOD AS NEEDED 30 tablet 0   lisinopril-hydrochlorothiazide (ZESTORETIC) 10-12.5 MG tablet TAKE 1 TABLET BY MOUTH DAILY 90 tablet 1   metoprolol succinate (TOPROL XL) 25 MG 24 hr tablet Take 1 tablet (25 mg total) by mouth daily. 90 tablet 3   rivaroxaban (XARELTO) 20 MG TABS tablet Take 1 tablet (20 mg total) by mouth daily with supper. 30 tablet 4   traZODone (DESYREL) 50 MG tablet Take 0.5 tablets (25 mg total) by mouth at bedtime. 30 tablet 3   Vitamin D, Ergocalciferol, 50000 units CAPS Take 1 capsule by mouth once a week. 8 capsule 0   bictegravir-emtricitabine-tenofovir AF (BIKTARVY) 50-200-25 MG TABS tablet Take 1 tablet by mouth daily. 30 tablet 11   ferrous sulfate 325 (65 FE) MG tablet TAKE 1 TABLET (325 MG TOTAL) BY MOUTH DAILY. TAKE WITH FOOD AND WITH BIKTARVY. 90 tablet  0   omeprazole (PRILOSEC) 20 MG capsule Take 1 capsule (20 mg total) by mouth 2 (two) times daily before a meal for 28 days, THEN 1 capsule (20 mg total) daily for 28 days. 84 capsule 0   Facility-Administered Medications Prior to Visit  Medication Dose Route Frequency Provider Last Rate Last Admin   0.9 %  sodium chloride infusion  500 mL Intravenous Continuous Danis, Estill Cotta III, MD         No Known Allergies  Social History   Tobacco Use   Smoking status: Former    Packs/day: 2.00    Years: 20.00    Pack years: 40.00    Types: Cigarettes    Quit date: 2004    Years since quitting: 18.9   Smokeless tobacco: Never    Tobacco comments:    20 years ago  Vaping Use   Vaping Use: Never used  Substance Use Topics   Alcohol use: Yes    Alcohol/week: 6.0 standard drinks    Types: 6 Cans of beer per week   Drug use: Yes    Frequency: 0.5 times per week    Types: Marijuana    Family History  Problem Relation Age of Onset   Cancer Mother        uterine vs ovarian?   Seizures Father    Mental illness Brother    Breast cancer Maternal Aunt    Diabetes Daughter    Colon polyps Neg Hx    Esophageal cancer Neg Hx    Rectal cancer Neg Hx    Stomach cancer Neg Hx    Thyroid disease Neg Hx     Social History   Substance and Sexual Activity  Sexual Activity Yes   Partners: Male   Birth control/protection: Condom   Comment: declined condoms 07/12/20     Objective:   Vitals:   02/17/21 0901  BP: 120/83  Pulse: 80  Temp: 98.2 F (36.8 C)  TempSrc: Oral  SpO2: 99%  Weight: 174 lb (78.9 kg)   Body mass index is 28.08 kg/m.  Physical Exam Vitals reviewed.  Constitutional:      Appearance: Normal appearance. She is not ill-appearing.  Cardiovascular:     Rate and Rhythm: Normal rate and regular rhythm.     Pulses: Normal pulses.  Abdominal:     General: There is no distension.     Palpations: Abdomen is soft.  Musculoskeletal:     Left shoulder: Tenderness present. Normal range of motion.       Arms:     Cervical back: Tenderness (posterior lower cervical spine tenderness. No abnormalities with external exam.) present. No torticollis. Pain with movement (let lateral rotation does cause pain) present. Normal range of motion.       Back:     Comments: Pain with lateral abduction of left shoulder. Tenderness along anterior deltoid and acromion process. Unable to hold up arm against light resistance. Shooting/tingling pain after exam worsened.   Lymphadenopathy:     Cervical: No cervical adenopathy.  Neurological:     General: No focal deficit present.     Mental Status: She is alert  and oriented to person, place, and time.     Lab Results Lab Results  Component Value Date   WBC 3.6 08/07/2020   HGB 10.6 (L) 08/07/2020   HCT 32.6 (L) 08/07/2020   MCV 88 08/07/2020   PLT 255 08/07/2020    Lab Results  Component Value Date   CREATININE  0.96 08/07/2020   BUN 13 08/07/2020   NA 138 08/07/2020   K 4.1 08/07/2020   CL 102 08/07/2020   CO2 19 (L) 08/07/2020    Lab Results  Component Value Date   ALT 49 (H) 08/07/2020   AST 70 (H) 08/07/2020   ALKPHOS 92 08/07/2020   BILITOT <0.2 08/07/2020    Lab Results  Component Value Date   CHOL 232 (H) 02/03/2021   HDL 81 02/03/2021   LDLCALC 126 (H) 02/03/2021   TRIG 131 02/03/2021   CHOLHDL 2.9 02/03/2021   HIV 1 RNA Quant (Copies/mL)  Date Value  02/03/2021 26 (H)  07/12/2020 Not Detected  02/07/2020 <20   CD4 T Cell Abs (/uL)  Date Value  02/07/2020 957  04/13/2019 907  03/31/2018 780     Assessment & Plan:   Problem List Items Addressed This Visit       Unprioritized   Human immunodeficiency virus (HIV) disease ([place]) - Primary (Chronic)    Doing well on once daily biktarvy. Labs look great. No changes in creatinine or DDI noted. CD4 normal > 900. Renew ADAP today - can give samples if needed. Reminded she will need to be back in January for the same.    Cervical Cancer Screen due 02-2023.  Flu and COVID vaccines are all UTD.  RTC in 65m for routine follow up.       Relevant Medications   bictegravir-emtricitabine-tenofovir AF (BIKTARVY) 50-200-25 MG TABS tablet   Other Relevant Orders   COMPLETE METABOLIC PANEL WITH GFR   HIV-1 RNA quant-no reflex-bld   T-helper cell (CD4)- ([facility] clinic only)   RPR   Urinalysis   Urine cytology ancillary only   Allergic rhinitis (Chronic)    Start antihistamine daily - Claritin or allegra or zyrtec.       Atrial fibrillation ([place])    Will reach out to cards clinic to request they call Ms. Schreffler back for details on [employer] assistance for xarelto.  Counseled on importance of taking this. If unable to obtain ?if she should at least start on ASA for clot proph. Defer to cards team.       Left arm pain    No known injury to provoke and now with 4-days of fairly consistent arm tingling and pain. No weakness on grip exam, no epicondyle symptoms/tenderness. She has pain with lateral abduction and cannot resist against me without pain. Lower posterior cervical spine tenderness and left lateral neck rotational pain. Suspect cervicalgia.  Referred to her PCP assistance with work up. For now OK to take Ibuprofen 400 mg BID short term, however may benefit from steroid (not-diabetic and NSAIDS are not ideal with Biktarvy for potential nephrotoxicity). May need gabapentin or alternative for nerve pain, however.   Appreciate IM clinic assistance. I gave her a work note today that she can return with instructions for no lifting > 20 # or over head reaching/lifting until she has had more appropriate work up and treatment.       Janene Madeira, MSN, NP-C [facility] [place] Pager: [phone] Office: [phone]  02/17/21  11:19 AM

## 2021-02-17 NOTE — Patient Instructions (Addendum)
Nice to see you today, Vicki Brown  Please continue your Biktarvy once a day as you have been doing. Your labs look great.  Refills have been sent in  Recommended return to clinic in 6 months with labs prior to please.   Please call your primary care team at the [facility] Internal medicine clinic to see them about your shoulder.   They are on the ground floor of [facility]. [phone] to schedule your appointment.   Over the counter Claritin or Zyrtec once a day. Give it a week to help with your sneezing and congestion.

## 2021-02-17 NOTE — Progress Notes (Signed)
   Covid-19 Vaccination Clinic  Name:  ISLAH EVE    MRN: [id number] DOB: 18-Mar-1960  02/17/2021  Ms. Rivenbark was observed post Covid-19 immunization for 15 minutes without incident. She was provided with Vaccine Information Sheet and instruction to access the V-Safe system.   Ms. Larue was instructed to call 911 with any severe reactions post vaccine: Difficulty breathing  Swelling of face and throat  A fast heartbeat  A bad rash all over body  Dizziness and weakness   Immunizations Administered     Name Date Dose VIS Date Route   Pfizer Covid-19 Vaccine Bivalent Booster 02/17/2021  9:11 AM 0.3 mL 11/06/2020 Intramuscular   Manufacturer: [place]   Lot: [id number]   [place]: [place], RMA

## 2021-02-17 NOTE — Assessment & Plan Note (Signed)
Will reach out to cards clinic to request they call Vicki Brown back for details on J&J assistance for xarelto. Counseled on importance of taking this. If unable to obtain ?if she should at least start on ASA for clot proph. Defer to cards team.

## 2021-02-17 NOTE — Assessment & Plan Note (Addendum)
Doing well on once daily biktarvy. Labs look great. No changes in creatinine or DDI noted. CD4 normal > 900. Renew ADAP today - can give samples if needed. Reminded she will need to be back in January for the same.    Cervical Cancer Screen due 02-2023.  Flu and COVID vaccines are all UTD.  RTC in 57m for routine follow up.

## 2021-02-21 ENCOUNTER — Encounter: Payer: Self-pay | Admitting: Internal Medicine

## 2021-02-21 ENCOUNTER — Ambulatory Visit (INDEPENDENT_AMBULATORY_CARE_PROVIDER_SITE_OTHER): Payer: 59 | Admitting: Internal Medicine

## 2021-02-21 VITALS — BP 136/86 | HR 74 | Temp 98.2°F | Ht 66.0 in | Wt 177.1 lb

## 2021-02-21 DIAGNOSIS — M79602 Pain in left arm: Secondary | ICD-10-CM

## 2021-02-21 MED ORDER — IBUPROFEN 800 MG PO TABS: 800.0000 mg | ORAL_TABLET | Freq: Three times a day (TID) | ORAL | 0 refills | Status: DC | PRN

## 2021-02-21 MED ORDER — CYCLOBENZAPRINE HCL 5 MG PO TABS: ORAL_TABLET | ORAL | 0 refills | Status: DC

## 2021-02-21 NOTE — Patient Instructions (Addendum)
It was nice seeing you today! Thank you for choosing [facility] for your Primary Care.    Today we talked about:   Left Arm and Shoulder Pain: On exam, you have quite a muscle spasm of your trapezius and inflammation of the bicep tendon. To help treat the pain and inflammation, we have sent in two medications for you:  Ibuprofen 800 mg, three times daily as needed. Make sure to take with food to avoid upset stomach and heart burn Flexeril 5 mg. Take 1/2 tablet before bedtime. If this does not help your spasm, you can increase to a full tablet before bedtime. Once you know if this medication makes you sleepy or not, you can take it during the day time.  With both these medications, it is very important you do not drink alcohol.

## 2021-02-24 DIAGNOSIS — Z79899 Other long term (current) drug therapy: Secondary | ICD-10-CM | POA: Diagnosis not present

## 2021-02-24 DIAGNOSIS — Z7901 Long term (current) use of anticoagulants: Secondary | ICD-10-CM | POA: Diagnosis not present

## 2021-02-24 DIAGNOSIS — Z87891 Personal history of nicotine dependence: Secondary | ICD-10-CM | POA: Diagnosis not present

## 2021-02-24 DIAGNOSIS — I10 Essential (primary) hypertension: Secondary | ICD-10-CM | POA: Diagnosis not present

## 2021-02-24 DIAGNOSIS — Z21 Asymptomatic human immunodeficiency virus [HIV] infection status: Secondary | ICD-10-CM | POA: Diagnosis not present

## 2021-02-24 DIAGNOSIS — R112 Nausea with vomiting, unspecified: Secondary | ICD-10-CM | POA: Insufficient documentation

## 2021-02-24 DIAGNOSIS — R197 Diarrhea, unspecified: Secondary | ICD-10-CM | POA: Diagnosis not present

## 2021-02-24 NOTE — Assessment & Plan Note (Signed)
Vicki Brown states that she has been experiencing left shoulder and arm pain with numbness and tingling that begins in the shoulder and radiates down to her fingertips.  The pain started approximately 2 weeks ago without any inciting event that she could identify.  She does use her arms at work, but does not do any heavy lifting.  The pain seems to be worse on the lateral aspect of her arm numbness and tingling radiating into the thumb, but Vicki Brown states that the numbness and tingling involves the entire dorsal aspect of her hand.  She denies any neck pain at this time.  She has been taking ibuprofen 400 mg once daily without significant improvement in symptoms.  Assessment/plan: On initial history and examination, symptoms concerning for C6 radiculopathy, however Spurling test is negative.  On further examination, there is significant trapezius tenderness and spasm with tenderness at the bicep tendon.  Significant spasm of the trapezius may be leading to nerve impingement.  We will start treatment with increasing dose of NSAID and muscle relaxers.  - Start Ibuprofen 100 mg every 8 hours - Start Flexeril 2.5 mg twice daily with instructions to increase to 5 mg twice daily if dose is ineffective.  Counseled extensively on potential drowsiness associated with muscle relaxers.  Counseled to not drive and not drink alcohol while taking this medication. - Work note provided to stay out of work for 5 days - Follow-up in 2 weeks if no improvement

## 2021-02-24 NOTE — Progress Notes (Signed)
° °  CC: left shoulder and arm pain  HPI:  [name] is a 60 y.o. with a PMHx as listed below who presents to the clinic for left shoulder and arm pain.   Please see the Encounters tab for problem-based Assessment & Plan regarding status of patient's acute and chronic conditions.  Past Medical History:  Diagnosis Date   Allergy    HIV infection ([place])    Hyperlipidemia    Hypertension    Insomnia 12/19/2014   Review of Systems: Review of Systems  Musculoskeletal:  Positive for joint pain. Negative for back pain, falls and neck pain.  Neurological:  Positive for tingling and sensory change.   Physical Exam:  Vitals:   02/21/21 1108  BP: 136/86  Pulse: 74  Temp: 98.2 F (36.8 C)  TempSrc: Oral  SpO2: 100%  Weight: 177 lb 1.6 oz (80.3 kg)  Height: 5\' 6"  (1.676 m)   Physical Exam Vitals and nursing note reviewed.  Constitutional:      General: She is not in acute distress.    Appearance: She is normal weight.  Musculoskeletal:     Left shoulder: Tenderness (TTP with abduction and internal rotation) present. No crepitus. Normal range of motion. Normal strength.     Left upper arm: Tenderness (TTP with extension. TTP at bicep tendon insertion) present. No deformity or lacerations.     Left elbow: Normal.     Left forearm: Normal.     Left wrist: Normal.     Cervical back: Spasms (Significant left superior trapezius muscle spasm) present. No deformity or bony tenderness.     Thoracic back: Normal.     Comments: Negative Spurling exam.   Skin:    General: Skin is warm and dry.  Neurological:     Mental Status: She is alert.  Psychiatric:        Mood and Affect: Mood normal.        Behavior: Behavior normal.        Thought Content: Thought content normal.        Judgment: Judgment normal.    Assessment & Plan:   See Encounters Tab for problem based charting.  Patient seen with Dr. Jimmye Norman

## 2021-02-25 ENCOUNTER — Encounter: Payer: Self-pay | Admitting: Family

## 2021-02-25 ENCOUNTER — Encounter (HOSPITAL_COMMUNITY): Payer: Self-pay

## 2021-02-25 ENCOUNTER — Emergency Department (HOSPITAL_COMMUNITY)
Admission: EM | Admit: 2021-02-25 | Discharge: 2021-02-25 | Disposition: A | Discharge status: Home / Self Care | Payer: 59 | Attending: Emergency Medicine | Admitting: Emergency Medicine

## 2021-02-25 DIAGNOSIS — R112 Nausea with vomiting, unspecified: Secondary | ICD-10-CM

## 2021-02-25 LAB — BASIC METABOLIC PANEL
Anion gap: 6 (ref 5–15)
BUN: 14 mg/dL (ref 8–23)
CO2: 21 mmol/L — ABNORMAL LOW (ref 22–32)
Calcium: 8.9 mg/dL (ref 8.9–10.3)
Chloride: 111 mmol/L (ref 98–111)
Creatinine, Ser: 1.1 mg/dL — ABNORMAL HIGH (ref 0.44–1.00)
GFR, Estimated: 57 mL/min — ABNORMAL LOW (ref >60)
Glucose, Bld: 95 mg/dL (ref 70–99)
Potassium: 3.3 mmol/L — ABNORMAL LOW (ref 3.5–5.1)
Sodium: 138 mmol/L (ref 135–145)

## 2021-02-25 LAB — CBC
HCT: 34.6 % — ABNORMAL LOW (ref 36.0–46.0)
Hemoglobin: 11.5 g/dL — ABNORMAL LOW (ref 12.0–15.0)
MCH: 31.3 pg (ref 26.0–34.0)
MCHC: 33.2 g/dL (ref 30.0–36.0)
MCV: 94.3 fL (ref 80.0–100.0)
Platelets: 212 10*3/uL (ref 150–400)
RBC: 3.67 MIL/uL — ABNORMAL LOW (ref 3.87–5.11)
RDW: 14.7 % (ref 11.5–15.5)
WBC: 3.6 10*3/uL — ABNORMAL LOW (ref 4.0–10.5)
nRBC: 0 % (ref 0.0–0.2)

## 2021-02-25 MED ORDER — ONDANSETRON 4 MG PO TBDP
4.0000 mg | ORAL_TABLET | Freq: Once | ORAL | Status: AC
  Administered 2021-02-25: 06:00:00 4 mg via ORAL
  Filled 2021-02-25: qty 1

## 2021-02-25 MED ORDER — ONDANSETRON HCL 4 MG PO TABS: 4.0000 mg | ORAL_TABLET | Freq: Four times a day (QID) | ORAL | 0 refills | Status: DC

## 2021-02-25 NOTE — Discharge Instructions (Signed)
Take zofran as directed to control nausea. Push fluids in small amounts and advance diet slowly.   Follow up with your doctor if symptoms persist, and return to the ED if you get any worse.

## 2021-02-25 NOTE — ED Triage Notes (Signed)
Patient arrives from home with complaint of N/V x 6 hours. Pt states she had [employer] tonight and became sick shortly after. Denies abdominal pain and diarrhea.

## 2021-02-25 NOTE — ED Provider Notes (Signed)
[place] DEPT Provider Note   CSN: [id number] Arrival date & time: 02/24/21  2358     History Chief Complaint  Patient presents with   Emesis    Vicki Brown is a 60 y.o. female.  Patient with history of HIV, HTN, HLD presents with nausea and vomiting without pain or diarrhea that started around 8:00 pm last night after eating [employer]. No fever. No hematemesis. She reports vomiting anything she attempts to swallow. No history of same.   The history is provided by the patient. No language interpreter was used.  Emesis Associated symptoms: no abdominal pain, no diarrhea and no fever       Past Medical History:  Diagnosis Date   Allergy    HIV infection ([place])    Hyperlipidemia    Hypertension    Insomnia 12/19/2014    Patient Active Problem List   Diagnosis Date Noted   Left arm pain 02/17/2021   Iron deficiency anemia 08/08/2020   Healthcare maintenance 08/07/2020   Fatigue 07/12/2020   Screening for cervical cancer 02/21/2020   Multinodular goiter 06/01/2019   Elevated liver function tests 04/22/2018   Hyperthyroidism 01/27/2018   Hoarseness 01/27/2018   Atrial fibrillation ([place]) 10/21/2017   Hyperlipidemia 10/21/2017   Insomnia 12/19/2014   Allergic rhinitis 12/28/2012   Dyslipidemia 07/31/2010   Human immunodeficiency virus (HIV) disease ([place]) 01/19/2008   PERIPHERAL NEUROPATHY 01/19/2008   Essential hypertension 01/19/2008    Past Surgical History:  Procedure Laterality Date   Broken Arm Left    Pins placed in arm   TUBAL LIGATION       OB History     Gravida  2   Para      Term      Preterm      AB      Living  2      SAB      IAB      Ectopic      Multiple      Live Births              Family History  Problem Relation Age of Onset   Cancer Mother        uterine vs ovarian?   Seizures Father    Mental illness Brother    Breast cancer Maternal Aunt    Diabetes  Daughter    Colon polyps Neg Hx    Esophageal cancer Neg Hx    Rectal cancer Neg Hx    Stomach cancer Neg Hx    Thyroid disease Neg Hx     Social History   Tobacco Use   Smoking status: Former    Packs/day: 2.00    Years: 20.00    Pack years: 40.00    Types: Cigarettes    Quit date: 2004    Years since quitting: 18.9   Smokeless tobacco: Never   Tobacco comments:    20 years ago  Vaping Use   Vaping Use: Never used  Substance Use Topics   Alcohol use: Yes    Alcohol/week: 6.0 standard drinks    Types: 6 Cans of beer per week   Drug use: Yes    Frequency: 0.5 times per week    Types: Marijuana    Home Medications Prior to Admission medications   Medication Sig Start Date End Date Taking? Authorizing Provider  acetaminophen (TYLENOL) 500 MG tablet Take 2 tablets (1,000 mg total) by mouth every 8 (eight) hours as  needed. 05/08/19   Zigmund Gottron, NP  bictegravir-emtricitabine-tenofovir AF (BIKTARVY) 50-200-25 MG TABS tablet Take 1 tablet by mouth daily. 02/17/21   [place] Callas, NP  cyclobenzaprine (FLEXERIL) 5 MG tablet Take 1/2 a tablet before bedtime. If pain is not well controlled, you can increase to 1 full tablet, twice daily. 02/21/21   Jose Persia, MD  ferrous sulfate 325 (65 FE) MG tablet TAKE 1 TABLET (325 MG TOTAL) BY MOUTH DAILY. TAKE WITH FOOD AND WITH BIKTARVY. 11/05/20 02/03/21  Sanjuan Dame, MD  HYDROcodone-homatropine [facility]) 5-1.5 MG/5ML syrup Take 5 mLs by mouth every 6 (six) hours as needed for cough. 03/27/20   Volney American, PA-C  ibuprofen (ADVIL) 800 MG tablet Take 1 tablet (800 mg total) by mouth every 8 (eight) hours as needed. Make sure to take with food. 02/21/21   Jose Persia, MD  lisinopril-hydrochlorothiazide (ZESTORETIC) 10-12.5 MG tablet TAKE 1 TABLET BY MOUTH DAILY 11/28/19   [place] Callas, NP  metoprolol succinate (TOPROL XL) 25 MG 24 hr tablet Take 1 tablet (25 mg total) by mouth daily. 06/21/19   Belva Crome, MD  omeprazole (PRILOSEC) 20 MG capsule Take 1 capsule (20 mg total) by mouth 2 (two) times daily before a meal for 28 days, THEN 1 capsule (20 mg total) daily for 28 days. 07/12/20 09/06/20  [place] Callas, NP  rivaroxaban (XARELTO) 20 MG TABS tablet Take 1 tablet (20 mg total) by mouth daily with supper. 07/15/20   Belva Crome, MD  traZODone (DESYREL) 50 MG tablet Take 0.5 tablets (25 mg total) by mouth at bedtime. 10/24/19   Campbell Riches, MD  Vitamin D, Ergocalciferol, 50000 units CAPS Take 1 capsule by mouth once a week. 07/22/20   [place] Callas, NP    Allergies    Patient has no known allergies.  Review of Systems   Review of Systems  Constitutional:  Negative for fever.  Respiratory:  Negative for shortness of breath.   Cardiovascular:  Negative for chest pain.  Gastrointestinal:  Positive for nausea and vomiting. Negative for abdominal pain and diarrhea.  Musculoskeletal:  Negative for back pain.  Neurological:  Negative for weakness and light-headedness.   Physical Exam Updated Vital Signs BP 110/65 (BP Location: Left Arm)    Pulse 71    Temp 98.7 F (37.1 C) (Oral)    Resp 16    Ht 5\' 7"  (1.702 m)    Wt 77.1 kg    SpO2 98%    BMI 26.63 kg/m   Physical Exam Vitals and nursing note reviewed.  Constitutional:      Appearance: She is well-developed.  HENT:     Head: Normocephalic.  Cardiovascular:     Rate and Rhythm: Normal rate and regular rhythm.     Heart sounds: No murmur heard. Pulmonary:     Effort: Pulmonary effort is normal.     Breath sounds: Normal breath sounds. No wheezing, rhonchi or rales.  Abdominal:     General: There is no distension.     Palpations: Abdomen is soft.     Tenderness: There is no abdominal tenderness. There is no guarding or rebound.  Musculoskeletal:        General: Normal range of motion.     Cervical back: Normal range of motion and neck supple.  Skin:    General: Skin is warm and dry.  Neurological:     General: No  focal deficit present.     Mental  Status: She is alert and oriented to person, place, and time.    ED Results / Procedures / Treatments   Labs (all labs ordered are listed, but only abnormal results are displayed) Labs Reviewed  CBC - Abnormal; Notable for the following components:      Result Value   WBC 3.6 (*)    RBC 3.67 (*)    Hemoglobin 11.5 (*)    HCT 34.6 (*)    All other components within normal limits  BASIC METABOLIC PANEL - Abnormal; Notable for the following components:   Potassium 3.3 (*)    CO2 21 (*)    Creatinine, Ser 1.10 (*)    GFR, Estimated 57 (*)    All other components within normal limits    EKG None  Radiology No results found.  Procedures Procedures   Medications Ordered in ED Medications  ondansetron (ZOFRAN-ODT) disintegrating tablet 4 mg (4 mg Oral Given 02/25/21 0535)    ED Course  I have reviewed the triage vital signs and the nursing notes.  Pertinent labs & imaging results that were available during my care of the patient were reviewed by me and considered in my medical decision making (see chart for details).    MDM Rules/Calculators/A&P                          Patient to ED with ss/sxs as per HPI. In NAD, VSS.   Abdominal exam is benign. No vomiting in the ED. Zofran provided after which she is drinking sprite without difficulty or vomiting.   Discussed discharge home. Will provide Rx Zofran. Return precautions discussed.    Final Clinical Impression(s) / ED Diagnoses Final diagnoses:  None   Nausea and vomiting  Rx / DC Orders ED Discharge Orders     None        Charlann Lange, PA-C 02/25/21 1470    Blanchie Dessert, MD 02/25/21 [phone]

## 2021-03-04 NOTE — Progress Notes (Signed)
Internal Medicine Clinic Attending  Case discussed with Dr. Basaraba  At the time of the visit.  We reviewed the resident's history and exam and pertinent patient test results.  I agree with the assessment, diagnosis, and plan of care documented in the resident's note.  

## 2021-05-26 ENCOUNTER — Other Ambulatory Visit: Payer: Self-pay | Admitting: Interventional Cardiology

## 2021-05-26 DIAGNOSIS — I4811 Longstanding persistent atrial fibrillation: Secondary | ICD-10-CM

## 2021-05-26 MED ORDER — RIVAROXABAN 20 MG PO TABS: 20.0000 mg | ORAL_TABLET | Freq: Every day | ORAL | 1 refills | Status: DC

## 2021-05-26 NOTE — Telephone Encounter (Signed)
Xarelto '20mg'$  refill request received. Pt is 61 years old, weight-77.1kg, Crea-1.10 on 02/25/2021, last seen by Kathyrn Drown on 08/09/2019 and pending appt with Vin on 07/03/2021, Diagnosis-Afib, CrCl-64.54m/min; Dose is appropriate based on dosing criteria. Will send in limited refill to requested pharmacy; 30 day with 1 refill to ensure pt has enough until appt on 07/03/2021.  ?

## 2021-05-26 NOTE — Telephone Encounter (Signed)
?*  STAT* If patient is at the pharmacy, call can be transferred to refill team. ? ? ?1. Which medications need to be refilled? (please list name of each medication and dose if known)  ?rivaroxaban (XARELTO) 20 MG TABS tablet ?lisinopril-hydrochlorothiazide (ZESTORETIC) 10-12.5 MG tablet ? ?2. Which pharmacy/location (including street and city if local pharmacy) is medication to be sent to? ?[id number] [place] [place] [facility] [address]? ?3. Do they need a 30 day or 90 day supply? 30 with refills ? ?Patient is scheduled to see VRobbie Lis Dr. SThompson CaulPA 07/03/21 ?

## 2021-05-26 NOTE — Telephone Encounter (Signed)
Called pt to inform her that her PCP refills her medication lisinopril-HCTZ and that she needed to contact their office to request a refill. Pt verbalized understanding.  ?

## 2021-05-28 ENCOUNTER — Other Ambulatory Visit: Payer: Self-pay

## 2021-05-28 DIAGNOSIS — I10 Essential (primary) hypertension: Secondary | ICD-10-CM

## 2021-05-28 MED ORDER — LISINOPRIL-HYDROCHLOROTHIAZIDE 10-12.5 MG PO TABS: 1.0000 | ORAL_TABLET | Freq: Every day | ORAL | 1 refills | Status: DC

## 2021-05-28 NOTE — Telephone Encounter (Signed)
lisinopril-hydrochlorothiazide (ZESTORETIC) 10-12.5 MG tablet, REFILL REQUEST @ [id number] [place], [place] [address] ?

## 2021-05-30 ENCOUNTER — Telehealth: Payer: Self-pay

## 2021-05-30 NOTE — Telephone Encounter (Signed)
**Note De-Identified Wilberto Console Obfuscation** Xarelto PA started through covermymeds and the following message was received: ?Spero Curb Key: [id number] - PA Case ID: [id number] - Rx #: [id number] ?Outcome: Approved today ?Type:Prior Auth;Coverage Start Date:05/30/2021;Coverage End Date:05/30/2022; ?Drug: Xarelto '20MG'$  tablets ?Form: [employer] Electronic PA Form [phone] NCPDP) ? ?[facility] [place]  [facility] [address] of this approval. ?

## 2021-06-03 ENCOUNTER — Encounter: Payer: Self-pay | Admitting: Student

## 2021-06-05 ENCOUNTER — Ambulatory Visit (INDEPENDENT_AMBULATORY_CARE_PROVIDER_SITE_OTHER): Payer: Managed Care, Other (non HMO) | Admitting: Student

## 2021-06-05 ENCOUNTER — Encounter: Payer: Self-pay | Admitting: Student

## 2021-06-05 VITALS — BP 128/76 | HR 72 | Wt 169.8 lb

## 2021-06-05 DIAGNOSIS — R7989 Other specified abnormal findings of blood chemistry: Secondary | ICD-10-CM | POA: Diagnosis not present

## 2021-06-05 DIAGNOSIS — I4811 Longstanding persistent atrial fibrillation: Secondary | ICD-10-CM | POA: Diagnosis not present

## 2021-06-05 DIAGNOSIS — I10 Essential (primary) hypertension: Secondary | ICD-10-CM

## 2021-06-05 NOTE — Patient Instructions (Signed)
Ms.Aaria L Seelbach, it was a pleasure seeing you today! ? ?Today we discussed: ?- I am going to re-check your liver labs and iron levels. I will call you with the results. ? ?- If your liver levels are still elevated, I'm going to order an ultrasound. ? ?- Please make sure to take all of your medications.  ? ?I have ordered the following labs today: ? ? ?Lab Orders    ?     CMP14 + Anion Gap    ?     Anemia Profile B     ? ? ?Follow-up: 6 months  ? ?Please make sure to arrive 15 minutes prior to your next appointment. If you arrive late, you may be asked to reschedule.  ? ?We look forward to seeing you next time. Please call our clinic at [phone] if you have any questions or concerns. The best time to call is Monday-Friday from 9am-4pm, but there is someone available 24/7. If after hours or the weekend, call the main hospital number and ask for the Internal Medicine Resident On-Call. If you need medication refills, please notify your pharmacy one week in advance and they will send [place] a request. ? ?Thank you for letting us take part in your care. Wishing you the best! ? ?Thank you, ?Sanjuan Dame, MD ? ?

## 2021-06-06 ENCOUNTER — Telehealth: Payer: Managed Care, Other (non HMO) | Admitting: Interventional Cardiology

## 2021-06-06 DIAGNOSIS — I4819 Other persistent atrial fibrillation: Secondary | ICD-10-CM

## 2021-06-06 DIAGNOSIS — I4811 Longstanding persistent atrial fibrillation: Secondary | ICD-10-CM

## 2021-06-06 LAB — ANEMIA PROFILE B
Basophils Absolute: 0 10*3/uL (ref 0.0–0.2)
Basos: 1 %
EOS (ABSOLUTE): 0.1 10*3/uL (ref 0.0–0.4)
Eos: 2 %
Ferritin: 27 ng/mL (ref 15–150)
Folate: 11 ng/mL (ref >3.0)
Hematocrit: 38.4 % (ref 34.0–46.6)
Hemoglobin: 13.2 g/dL (ref 11.1–15.9)
Immature Grans (Abs): 0 10*3/uL (ref 0.0–0.1)
Immature Granulocytes: 0 %
Iron Saturation: 24 % (ref 15–55)
Iron: 93 ug/dL (ref 27–139)
Lymphocytes Absolute: 1.4 10*3/uL (ref 0.7–3.1)
Lymphs: 46 %
MCH: 32.5 pg (ref 26.6–33.0)
MCHC: 34.4 g/dL (ref 31.5–35.7)
MCV: 95 fL (ref 79–97)
Monocytes Absolute: 0.2 10*3/uL (ref 0.1–0.9)
Monocytes: 6 %
Neutrophils Absolute: 1.4 10*3/uL (ref 1.4–7.0)
Neutrophils: 45 %
Platelets: 232 10*3/uL (ref 150–450)
RBC: 4.06 x10E6/uL (ref 3.77–5.28)
RDW: 13.5 % (ref 11.7–15.4)
Retic Ct Pct: 1.6 % (ref 0.6–2.6)
Total Iron Binding Capacity: 392 ug/dL (ref 250–450)
UIBC: 299 ug/dL (ref 118–369)
Vitamin B-12: 571 pg/mL (ref 232–1245)
WBC: 3.1 10*3/uL — ABNORMAL LOW (ref 3.4–10.8)

## 2021-06-06 LAB — CMP14 + ANION GAP
ALT: 23 IU/L (ref 0–32)
AST: 35 IU/L (ref 0–40)
Albumin/Globulin Ratio: 1.5 (ref 1.2–2.2)
Albumin: 4.4 g/dL (ref 3.8–4.8)
Alkaline Phosphatase: 94 IU/L (ref 44–121)
Anion Gap: 17 mmol/L (ref 10.0–18.0)
BUN/Creatinine Ratio: 9 — ABNORMAL LOW (ref 12–28)
BUN: 10 mg/dL (ref 8–27)
Bilirubin Total: 0.2 mg/dL (ref 0.0–1.2)
CO2: 21 mmol/L (ref 20–29)
Calcium: 9.4 mg/dL (ref 8.7–10.3)
Chloride: 102 mmol/L (ref 96–106)
Creatinine, Ser: 1.09 mg/dL — ABNORMAL HIGH (ref 0.57–1.00)
Globulin, Total: 3 g/dL (ref 1.5–4.5)
Glucose: 90 mg/dL (ref 70–99)
Potassium: 3.9 mmol/L (ref 3.5–5.2)
Sodium: 140 mmol/L (ref 134–144)
Total Protein: 7.4 g/dL (ref 6.0–8.5)
eGFR: 57 mL/min/{1.73_m2} — ABNORMAL LOW (ref >59)

## 2021-06-06 MED ORDER — WARFARIN SODIUM 5 MG PO TABS: 5.0000 mg | ORAL_TABLET | Freq: Every day | ORAL | 0 refills | Status: DC

## 2021-06-06 NOTE — Telephone Encounter (Addendum)
Pt returned call to the Anticoagulation Clinic and left a message. Called the pt back and she stated she had 5 tablets of Xarelto left. She will overlap Xarelto '20mg'$  and Warfarin '5mg'$  for 3 days; she will take Warfarin and Xarelto/overlap on 4/2, 4/3, and 4/4 then she will take Warfarin daily until appt on 4/5 at 1030am. Also, pt works so this is best day for her and she prefers AM appts as she works in the PM. Discussed with the pt that Warfarin requires close monitoring ans she will have weekly checks until stable and she verbalized understanding. Also, inquired about her alcohol drinking and she states she drinks a beer every night after work, and on the weekend she drinks more cause has does not have to work. Advised of how beer will cause the INR to increase. Durward Mallard, pharmacist, and discussed the dose of warfarin to start pt on again and since liver labs are stable and she has been drinking over 40 years he agreed to start '5mg'$  tablet. Will send in Warfarin '5mg'$  tablet and d/c Xarelto '20mg'$  off med list. Also, advised pt that a limited supply of warfarin will be sent in until warfarin dose is set.  ? ?Called the pharmacy to confirm they received the warfarin refill and the message to d/c the xarelto and he confirmed.  ?

## 2021-06-06 NOTE — Telephone Encounter (Signed)
Called pt to discuss switching from Xarelto to Warfarin but there was no answer, therefore, left a message to call back regarding this.  ? ?Pt was last seen by Dr Tamala Julian on 08/09/2019 and has an appt with Vin on 07/03/2021. She is currently on Xarelto '20mg'$   for Afib and unable to afford it.  ?

## 2021-06-06 NOTE — Assessment & Plan Note (Signed)
BP Readings from Last 3 Encounters:  ?06/05/21 128/76  ?02/25/21 107/70  ?02/21/21 136/86  ? ?Blood pressure at goal today. She has not had any issues with her medications and has been asymptomatic. Will continue current regimen, check renal function today.  ? ?- Lisinopril-HCTZ 10-12.'5mg'$  daily ?- Metoprolol succinate '25mg'$  daily ?

## 2021-06-06 NOTE — Assessment & Plan Note (Signed)
Since starting iron supplements, Ms. Stifter reports fatigue has significantly improved. She has not experienced any lightheadedness, dizziness, syncopal episodes, melena, hematochezia, weight changes, night sweats.  ? ?Will repeat iron studies today to assess response. In addition, last colonoscopy in 2018, she is due for another later this year. ? ?- Repeat iron studies today ?- Ferrous sulfate '325mg'$  daily with Biktarvy ?- Colonoscopy later this year ?

## 2021-06-06 NOTE — Telephone Encounter (Signed)
Recommend patient overlap her 3 remaining Xarelto tablets with warfarin 5 mg daily.  If patient not able to pick up warfarin today from pharmacy, recommend she pick up a Xarelto sample pack on Monday so she can complete the overlap. ?

## 2021-06-06 NOTE — Progress Notes (Signed)
? ?  CC: follow-up ? ?HPI: ? ?[name] is a 61 y.o. person with medical history as below presenting to [facility] for follow-up. ? ?Please see problem-based list for further details, assessments, and plans. ? ?Past Medical History:  ?Diagnosis Date  ? Allergy   ? HIV infection ([place])   ? Hyperlipidemia   ? Hypertension   ? Insomnia 12/19/2014  ? ?Review of Systems:  As per HPI ? ?Physical Exam: ? ?Vitals:  ? 06/05/21 1502  ?BP: 128/76  ?Pulse: 72  ?SpO2: 98%  ?Weight: 169 lb 12.8 oz (77 kg)  ? ?General: Resting comfortably in no acute distress ?CV: Regular rate, rhythm. No murmurs appreciated. Distal pulses 2+ bilaterally.  ?Pulm: Normal respiratory effort on room air. Clear to ausculation bilaterally. ?GI: Abdomen soft, non-tender, non-distended. No hepatosplenomegaly appreciated. Normoactive bowel sounds. ?MSK: Normal bulk, tone. No pitting edema bilateral lower extremities. ?Skin: Warm, dry. No rashes or lesions appreciated. ?Neuro: Awake, alert, conversing appropriately. ?Psych: Normal mood, affect, speech. ? ?Assessment & Plan:  ? ?Atrial fibrillation ([place]) ?On exam, patient sounds in regular rhythm. She has been asymptomatic, denying chest pain, palpitations, lightheadedness. Ms. Vicki Brown reports she is currently trying to get her Xarelto through her cardiologists' office. Counseled on importance of compliance with this medication. She verbalized understanding. ? ?- Continue Xarelto '20mg'$  daily ?- Metoprolol succinate '25mg'$  daily ? ?Elevated liver function tests ?During chart review today, LFT's noted to be consistently elevated without known cause. She does have long history of daily alcohol consumption, reports currently ~2 beers daily (previously 4-5). No illicit drug use, family history of liver disease, or history of hepatitis.  ? ?Work-up thus far appears negative. Discussed with ID during last visit, Biktarvy unlikely to be the sole contributor of these elevated LFT's. Per chart review, synthetic function  seems to have been in tact. She is not showing any clinical signs or symptoms of cirrhosis. Suspect possible NAFLD. Plan to repeat CMP today and if continues to be elevated will obtain RUQ ultrasound for further evaluation. ? ?- Repeat CMP today ?- If LFT's elevated, obtain RUQ ultrasound ? ?Iron deficiency anemia ?Since starting iron supplements, Ms. Vicki Brown reports fatigue has significantly improved. She has not experienced any lightheadedness, dizziness, syncopal episodes, melena, hematochezia, weight changes, night sweats.  ? ?Will repeat iron studies today to assess response. In addition, last colonoscopy in 2018, she is due for another later this year. ? ?- Repeat iron studies today ?- Ferrous sulfate '325mg'$  daily with Biktarvy ?- Colonoscopy later this year ? ?Essential hypertension ?BP Readings from Last 3 Encounters:  ?06/05/21 128/76  ?02/25/21 107/70  ?02/21/21 136/86  ? ?Blood pressure at goal today. She has not had any issues with her medications and has been asymptomatic. Will continue current regimen, check renal function today.  ? ?- Lisinopril-HCTZ 10-12.'5mg'$  daily ?- Metoprolol succinate '25mg'$  daily ? ? ?Patient discussed with Dr. Dareen Piano ? ?Sanjuan Dame, MD ?Internal Medicine PGY-2 ?Pager: [phone] ? ?

## 2021-06-06 NOTE — Telephone Encounter (Signed)
Pt c/o medication issue: ? ?1. Name of Medication: rivaroxaban (XARELTO) 20 MG TABS tablet ? ?2. How are you currently taking this medication (dosage and times per day)?  ? ?3. Are you having a reaction (difficulty breathing--STAT)? no ? ?4. What is your medication issue? Patient states the medication is too expensive. She says the office was working on an authorization, but it was not approved. ?

## 2021-06-06 NOTE — Assessment & Plan Note (Signed)
During chart review today, LFT's noted to be consistently elevated without known cause. She does have long history of daily alcohol consumption, reports currently ~2 beers daily (previously 4-5). No illicit drug use, family history of liver disease, or history of hepatitis.  ? ?Work-up thus far appears negative. Discussed with ID during last visit, Biktarvy unlikely to be the sole contributor of these elevated LFT's. Per chart review, synthetic function seems to have been in tact. She is not showing any clinical signs or symptoms of cirrhosis. Suspect possible NAFLD. Plan to repeat CMP today and if continues to be elevated will obtain RUQ ultrasound for further evaluation. ? ?- Repeat CMP today ?- If LFT's elevated, obtain RUQ ultrasound ?

## 2021-06-06 NOTE — Assessment & Plan Note (Signed)
On exam, patient sounds in regular rhythm. She has been asymptomatic, denying chest pain, palpitations, lightheadedness. Vicki Brown reports she is currently trying to get her Xarelto through her cardiologists' office. Counseled on importance of compliance with this medication. She verbalized understanding. ? ?- Continue Xarelto '20mg'$  daily ?- Metoprolol succinate '25mg'$  daily ?

## 2021-06-06 NOTE — Telephone Encounter (Signed)
**Note De-Identified Vicki Brown Obfuscation** [facility] and was advised that the pts Xarelto does not require a PA as the pt ins is covering it but the pts co-pay is currently $540/30 day supply. ? ?We discussed the [employer] but she states that she cannot afford $85/30 day supply or $240/90 day supply of Xarelto. ? ?We discussed switching to Warfarin and she states that that will be what she has to do. ?She is aware that someone from our Coumadin Clinic will be contacting her to discuss this switch from Xarelto to Warfarin. ? ?She cstated that she currently has 3 Xarelto tablets on hand. ? ?Also, She did request [employer] PA[employer] phone number which I provided to her and I advised her that there has been changes mage to their eligibility requirements and that she may no longer qualify. ? ? ?

## 2021-06-09 NOTE — Progress Notes (Signed)
Internal Medicine Clinic Attending ? ?Case discussed with Dr. Braswell  At the time of the visit.  We reviewed the resident?s history and exam and pertinent patient test results.  I agree with the assessment, diagnosis, and plan of care documented in the resident?s note.  ?

## 2021-06-13 ENCOUNTER — Telehealth: Payer: Self-pay

## 2021-06-13 NOTE — Telephone Encounter (Signed)
Pt missed scheduled New Coumadin appt at 10:30am. Called pt and pt's daughter, no answer. Left message to call back as soon as possible to reschedule appt.  ?

## 2021-06-16 ENCOUNTER — Ambulatory Visit (INDEPENDENT_AMBULATORY_CARE_PROVIDER_SITE_OTHER): Payer: Managed Care, Other (non HMO)

## 2021-06-16 DIAGNOSIS — I4811 Longstanding persistent atrial fibrillation: Secondary | ICD-10-CM | POA: Diagnosis not present

## 2021-06-16 DIAGNOSIS — Z5181 Encounter for therapeutic drug level monitoring: Secondary | ICD-10-CM | POA: Diagnosis not present

## 2021-06-16 LAB — POCT INR: INR: 1 — AB (ref 2.0–3.0)

## 2021-06-16 NOTE — Patient Instructions (Addendum)
Description   ?START taking Warfarin '5mg'$  daily and continue taking Xarelto today 4/10 and tomorrow 4/11, then discontinue Xarelto and continue taking Warfarin.  ?Stay consistent with greens each week (3-4 times per week) ?Recheck INR in 1 week.  ?Coumadin Clinic [phone] ? ?A full discussion of the nature of anticoagulants has been carried out.  A benefit risk analysis has been presented to the patient, so that they understand the justification for choosing anticoagulation at this time. The need for frequent and regular monitoring, precise dosage adjustment and compliance is stressed.  Side effects of potential bleeding are discussed.  The patient should avoid any OTC items containing aspirin or ibuprofen, and should avoid great swings in general diet.  Avoid alcohol consumption.  Call if any signs of abnormal bleeding.  ?  ?   ?

## 2021-06-23 ENCOUNTER — Ambulatory Visit (INDEPENDENT_AMBULATORY_CARE_PROVIDER_SITE_OTHER): Payer: Managed Care, Other (non HMO) | Admitting: *Deleted

## 2021-06-23 DIAGNOSIS — I4811 Longstanding persistent atrial fibrillation: Secondary | ICD-10-CM | POA: Diagnosis not present

## 2021-06-23 LAB — POCT INR: INR: 1.1 — AB (ref 2.0–3.0)

## 2021-06-23 NOTE — Patient Instructions (Addendum)
Description   ?Today an tomorrow take 1.5 tablets (7.'5mg'$ ) then continue taking Warfarin 1 tablet ('5mg'$ ) daily. Stay consistent with greens each week (3-4 times per week). Recheck INR in 1 week. Coumadin Clinic [phone] ?  ?  ? ?

## 2021-06-30 NOTE — Progress Notes (Signed)
?Cardiology Office Note:   ? ?Date:  07/03/2021  ? ?ID:  Vicki Brown, DOB Apr 29, 1960, MRN [id number] ? ?PCP:  Sanjuan Dame, MD  ?[facility] Cardiologist:  Sinclair Grooms, MD  ?[facility] Electrophysiologist:  None  ? ?Chief Complaint: Medication refills ? ?History of Present Illness:   ? ?Vicki Brown is a 61 y.o. female with a hx of paroxysmal atrial fibrillation, hypertension, hyperlipidemia and HIV seen for follow-up. ? ?Stress test 10/2017 was low risk.  Echocardiogram April 2021 showed LV function of 60 to 65% and no regional wall motion abnormality.  ? ?Recently changed Xarelto to warfarin due to cost. ? ?Here today for follow-up.  No complaints.  She walks around mall without chest pain or shortness of breath.  Compliant with her medications.  Denies dizziness, palpitation, orthopnea, PND, syncope, lower extremity edema or melena. ? ?Past Medical History:  ?Diagnosis Date  ? Allergy   ? HIV infection ([place])   ? Hyperlipidemia   ? Hypertension   ? Insomnia 12/19/2014  ? ? ?Past Surgical History:  ?Procedure Laterality Date  ? Broken Arm Left   ? Pins placed in arm  ? TUBAL LIGATION    ? ? ?Current Medications: ?Current Meds  ?Medication Sig  ? bictegravir-emtricitabine-tenofovir AF (BIKTARVY) 50-200-25 MG TABS tablet Take 1 tablet by mouth daily.  ? ibuprofen (ADVIL) 800 MG tablet Take 1 tablet (800 mg total) by mouth every 8 (eight) hours as needed. Make sure to take with food.  ? lisinopril-hydrochlorothiazide (ZESTORETIC) 10-12.5 MG tablet Take 1 tablet by mouth daily.  ? metoprolol succinate (TOPROL XL) 25 MG 24 hr tablet Take 1 tablet (25 mg total) by mouth daily.  ? traZODone (DESYREL) 50 MG tablet Take 0.5 tablets (25 mg total) by mouth at bedtime.  ? Vitamin D, Ergocalciferol, 50000 units CAPS Take 1 capsule by mouth once a week.  ? warfarin (COUMADIN) 5 MG tablet TAKE 1 TABLET (5 MG TOTAL) BY MOUTH DAILY.  ? [DISCONTINUED] acetaminophen (TYLENOL) 500 MG tablet Take 2 tablets (1,000 mg  total) by mouth every 8 (eight) hours as needed.  ? [DISCONTINUED] ondansetron (ZOFRAN) 4 MG tablet Take 1 tablet (4 mg total) by mouth every 6 (six) hours.  ?  ? ?Allergies:   Patient has no known allergies.  ? ?Social History  ? ?Socioeconomic History  ? Marital status: Single  ?  Spouse name: Not on file  ? Number of children: Not on file  ? Years of education: Not on file  ? Highest education level: Not on file  ?Occupational History  ? Not on file  ?Tobacco Use  ? Smoking status: Former  ?  Packs/day: 2.00  ?  Years: 20.00  ?  Pack years: 40.00  ?  Types: Cigarettes  ?  Quit date: 2004  ?  Years since quitting: 19.3  ? Smokeless tobacco: Never  ? Tobacco comments:  ?  20 years ago  ?Vaping Use  ? Vaping Use: Never used  ?Substance and Sexual Activity  ? Alcohol use: Yes  ?  Alcohol/week: 6.0 standard drinks  ?  Types: 6 Cans of beer per week  ? Drug use: Yes  ?  Frequency: 0.5 times per week  ?  Types: Marijuana  ? Sexual activity: Yes  ?  Partners: Male  ?  Birth control/protection: Condom  ?  Comment: declined condoms 07/12/20  ?Other Topics Concern  ? Not on file  ?Social History Narrative  ? Not on file  ? ?  Social Determinants of Health  ? ?Financial Resource Strain: Not on file  ?Food Insecurity: Not on file  ?Transportation Needs: Not on file  ?Physical Activity: Not on file  ?Stress: Not on file  ?Social Connections: Not on file  ?  ? ?Family History: ?The patient's family history includes Breast cancer in her maternal aunt; Cancer in her mother; Diabetes in her daughter; Mental illness in her brother; Seizures in her father. There is no history of Colon polyps, Esophageal cancer, Rectal cancer, Stomach cancer, or Thyroid disease.   ? ?ROS:   ?Please see the history of present illness.    ?All other systems reviewed and are negative.  ? ?EKGs/Labs/Other Studies Reviewed:   ? ?The following studies were reviewed today: ? ?Echo 07/05/2019 ?1. Left ventricular ejection fraction, by estimation, is 60 to 65%.  The  ?left ventricle has normal function. The left ventricle has no regional  ?wall motion abnormalities. Left ventricular diastolic parameters were  ?normal.  ? 2. Right ventricular systolic function is normal. The right ventricular  ?size is normal. There is normal pulmonary artery systolic pressure.  ? 3. The mitral valve is normal in structure. Trivial mitral valve  ?regurgitation. No evidence of mitral stenosis.  ? 4. The aortic valve is tricuspid. Aortic valve regurgitation is trivial.  ?Mild aortic valve sclerosis is present, with no evidence of aortic valve  ?stenosis.  ? 5. The inferior vena cava is normal in size with greater than 50%  ?respiratory variability, suggesting right atrial pressure of 3 mmHg.  ? ?Stress test 10/2017 ?Nuclear stress EF: 72%. ?The left ventricular ejection fraction is hyperdynamic (>65%). ?There was no ST segment deviation noted during stress. ?The study is normal. ?This is a low risk study. ?  ?Normal resting and stress perfusion. No ischemia or infarction EF ?72% ? ?EKG:  EKG is  ordered today.  The ekg ordered today demonstrates normal sinus rhythm ? ?Recent Labs: ?08/07/2020: TSH 1.100 ?06/05/2021: ALT 23; BUN 10; Creatinine, Ser 1.09; Hemoglobin 13.2; Platelets 232; Potassium 3.9; Sodium 140  ?Recent Lipid Panel ?   ?Component Value Date/Time  ? CHOL 232 (H) 02/03/2021 1422  ? TRIG 131 02/03/2021 1422  ? HDL 81 02/03/2021 1422  ? CHOLHDL 2.9 02/03/2021 1422  ? VLDL 13 04/22/2016 1344  ? LDLCALC 126 (H) 02/03/2021 1422  ? ? ? ?Risk Assessment/Calculations:   ? ?CHA2DS2-VASc Score = 2  ? This indicates a 2.2% annual risk of stroke. ?The patient's score is based upon: ?CHF History: 0 ?HTN History: 1 ?Diabetes History: 0 ?Stroke History: 0 ?Vascular Disease History: 0 ?Age Score: 0 ?Gender Score: 1 ?  ?  ? ? ?Physical Exam:   ? ?VS:  BP 124/72   Pulse 80   Ht '5\' 7"'$  (1.702 m)   Wt 170 lb (77.1 kg)   BMI 26.63 kg/m?    ? ?Wt Readings from Last 3 Encounters:  ?07/03/21 170 lb (77.1  kg)  ?06/05/21 169 lb 12.8 oz (77 kg)  ?02/25/21 170 lb (77.1 kg)  ?  ? ?GEN:  Well nourished, well developed in no acute distress ?HEENT: Normal ?NECK: No JVD; No carotid bruits ?LYMPHATICS: No lymphadenopathy ?CARDIAC: RRR, no murmurs, rubs, gallops ?RESPIRATORY:  Clear to auscultation without rales, wheezing or rhonchi  ?ABDOMEN: Soft, non-tender, non-distended ?MUSCULOSKELETAL:  No edema; No deformity  ?SKIN: Warm and dry ?NEUROLOGIC:  Alert and oriented x 3 ?PSYCHIATRIC:  Normal affect  ? ?ASSESSMENT AND PLAN:  ? ? ?Paroxysmal atrial fibrillation ?Maintaining sinus  rhythm.  Continue Toprol-XL and warfarin.  No bleeding issue ? ?2.  Hypertension ?-Blood pressure stable on current medications.  No change ? ?Medication Adjustments/Labs and Tests Ordered: ?Current medicines are reviewed at length with the patient today.  Concerns regarding medicines are outlined above.  ?Orders Placed This Encounter  ?Procedures  ? EKG 12-Lead  ? ?No orders of the defined types were placed in this encounter. ? ? ?Patient Instructions  ?Medication Instructions:  ?Your physician recommends that you continue on your current medications as directed. Please refer to the Current Medication list given to you today. ? ?*If you need a refill on your cardiac medications before your next appointment, please call your pharmacy* ? ?Lab Work: ?NONE ? ?Testing/Procedures: ?NONE ? ?Follow-Up: ?At [facility], you and your health needs are our priority.  As part of our continuing mission to provide you with exceptional heart care, we have created designated Provider Care Teams.  These Care Teams include your primary Cardiologist (physician) and Advanced Practice Providers (APPs -  Physician Assistants and Nurse Practitioners) who all work together to provide you with the care you need, when you need it. ? ?Your next appointment:   ?1 year(s) ? ?The format for your next appointment:   ?In Person ? ?Provider:   ?Sinclair Grooms, MD  { ? ? ?Important Information About Sugar ? ? ? ? ?   ? ?Signed, ?Leanor Kail, [place]  ?07/03/2021 11:02 AM    ?[place] ?

## 2021-07-01 ENCOUNTER — Other Ambulatory Visit: Payer: Self-pay | Admitting: Interventional Cardiology

## 2021-07-01 DIAGNOSIS — I4819 Other persistent atrial fibrillation: Secondary | ICD-10-CM

## 2021-07-01 DIAGNOSIS — I4811 Longstanding persistent atrial fibrillation: Secondary | ICD-10-CM

## 2021-07-01 NOTE — Telephone Encounter (Signed)
Refill request for warfarin: ? ?Last INR was 1.1 on 4/17 ?Next INR due on 4/27 ?Last OV 08/09/19  ? ?Pt has MD appt scheduled for 07/03/21 ? ?Refill approved x 3 ?

## 2021-07-03 ENCOUNTER — Ambulatory Visit (INDEPENDENT_AMBULATORY_CARE_PROVIDER_SITE_OTHER): Payer: Managed Care, Other (non HMO) | Admitting: *Deleted

## 2021-07-03 ENCOUNTER — Ambulatory Visit (INDEPENDENT_AMBULATORY_CARE_PROVIDER_SITE_OTHER): Payer: Managed Care, Other (non HMO) | Admitting: Physician Assistant

## 2021-07-03 ENCOUNTER — Encounter: Payer: Self-pay | Admitting: Physician Assistant

## 2021-07-03 VITALS — BP 124/72 | HR 80 | Ht 67.0 in | Wt 170.0 lb

## 2021-07-03 DIAGNOSIS — I480 Paroxysmal atrial fibrillation: Secondary | ICD-10-CM

## 2021-07-03 DIAGNOSIS — I10 Essential (primary) hypertension: Secondary | ICD-10-CM

## 2021-07-03 DIAGNOSIS — I4811 Longstanding persistent atrial fibrillation: Secondary | ICD-10-CM | POA: Diagnosis not present

## 2021-07-03 DIAGNOSIS — I4891 Unspecified atrial fibrillation: Secondary | ICD-10-CM | POA: Diagnosis not present

## 2021-07-03 LAB — POCT INR: INR: 1.7 — AB (ref 2.0–3.0)

## 2021-07-03 NOTE — Patient Instructions (Signed)

## 2021-07-03 NOTE — Patient Instructions (Signed)
Description   ?Today take 1.5 tablets (7.'5mg'$ ) then start taking Warfarin 1 tablet ('5mg'$ ) daily except 1.5 tablets on Sundays. Stay consistent with greens each week (3-4 times per week). Recheck INR in 1 week. Coumadin Clinic [phone] ?  ?  ?

## 2021-07-10 ENCOUNTER — Ambulatory Visit (INDEPENDENT_AMBULATORY_CARE_PROVIDER_SITE_OTHER): Payer: Commercial Managed Care - HMO | Admitting: *Deleted

## 2021-07-10 DIAGNOSIS — I4811 Longstanding persistent atrial fibrillation: Secondary | ICD-10-CM

## 2021-07-10 LAB — POCT INR: INR: 2.2 (ref 2.0–3.0)

## 2021-07-10 NOTE — Patient Instructions (Signed)
Description   ?Continue taking Warfarin 1 tablet ('5mg'$ ) daily except 1.5 tablets on Sundays. Stay consistent with greens each week (3-4 times per week). Recheck INR in 1 week. Coumadin Clinic [phone] ?  ?  ?

## 2021-07-31 ENCOUNTER — Ambulatory Visit (INDEPENDENT_AMBULATORY_CARE_PROVIDER_SITE_OTHER): Payer: Commercial Managed Care - HMO | Admitting: *Deleted

## 2021-07-31 DIAGNOSIS — I4811 Longstanding persistent atrial fibrillation: Secondary | ICD-10-CM | POA: Diagnosis not present

## 2021-07-31 LAB — POCT INR: INR: 1 — AB (ref 2.0–3.0)

## 2021-07-31 NOTE — Patient Instructions (Signed)
Description   Today take 1.5 tablets and tomorrow 1.5 tablets then continue taking Warfarin 1 tablet ('5mg'$ ) daily except 1.5 tablets on Sundays. Stay consistent with greens each week (3-4 times per week). Recheck INR in 1 week. Coumadin Clinic [phone]

## 2021-08-06 ENCOUNTER — Other Ambulatory Visit: Payer: Self-pay | Admitting: *Deleted

## 2021-08-06 MED ORDER — IBUPROFEN 800 MG PO TABS
800.0000 mg | ORAL_TABLET | Freq: Three times a day (TID) | ORAL | 0 refills | Status: DC | PRN
Start: 2021-08-06 — End: 2022-03-16

## 2021-08-07 ENCOUNTER — Ambulatory Visit (INDEPENDENT_AMBULATORY_CARE_PROVIDER_SITE_OTHER): Payer: Commercial Managed Care - HMO

## 2021-08-07 DIAGNOSIS — I4811 Longstanding persistent atrial fibrillation: Secondary | ICD-10-CM

## 2021-08-07 LAB — POCT INR: INR: 1.7 — AB (ref 2.0–3.0)

## 2021-08-07 NOTE — Patient Instructions (Signed)
Description   Take an extra 1/2 tablet today when you get home, then start taking Warfarin 1 tablet ('5mg'$ ) daily except 1.5 tablets on Sundays and Thursdays. Stay consistent with greens each week (3-4 times per week). Recheck INR in 1 week. Coumadin Clinic [phone]

## 2021-08-14 ENCOUNTER — Ambulatory Visit (INDEPENDENT_AMBULATORY_CARE_PROVIDER_SITE_OTHER): Payer: Commercial Managed Care - HMO | Admitting: *Deleted

## 2021-08-14 DIAGNOSIS — I4811 Longstanding persistent atrial fibrillation: Secondary | ICD-10-CM

## 2021-08-14 LAB — POCT INR: INR: 2 (ref 2.0–3.0)

## 2021-08-14 MED ORDER — RIVAROXABAN 20 MG PO TABS: 20.0000 mg | ORAL_TABLET | Freq: Every day | ORAL | 1 refills | Status: DC

## 2021-08-14 NOTE — Patient Instructions (Signed)
Description   Continue taking Warfarin 1 tablet ('5mg'$ ) daily except 1.5 tablets on Sundays and Thursdays. Stay consistent with greens each week (3-4 times per week). Recheck INR in 2 weeks. Coumadin Clinic [phone]

## 2021-08-15 ENCOUNTER — Telehealth: Payer: Self-pay | Admitting: *Deleted

## 2021-08-15 NOTE — Telephone Encounter (Addendum)
Called pt to update her that I spoke with the pharmacy and that they received the Xarelto '20mg'$  rx and she can start it tonight and not take anymore warfarin as we discussed at the visit on yesterday. Had to leave a message for her to call back to confirm this and call us back. Will await a call back to confirm. Also, will need to d/c appt if pt started Xarelto.

## 2021-08-18 ENCOUNTER — Encounter: Payer: Self-pay | Admitting: Infectious Diseases

## 2021-08-18 NOTE — Telephone Encounter (Signed)
Spoke with pt and she stated she received the voicemail and started the Xarelto '20mg'$  dose as instructed and stopped her Warfarin. She did pick up the Xarelto from the pharmacy and paid $10 for a 90 day supply per the copay card. Advised to call back if she has any issues and she verbalized understanding.

## 2021-09-04 ENCOUNTER — Encounter: Payer: Self-pay | Admitting: Gastroenterology

## 2021-09-17 ENCOUNTER — Other Ambulatory Visit: Payer: Self-pay | Admitting: Student

## 2021-09-17 DIAGNOSIS — I10 Essential (primary) hypertension: Secondary | ICD-10-CM

## 2021-09-17 MED ORDER — LISINOPRIL-HYDROCHLOROTHIAZIDE 10-12.5 MG PO TABS: 1.0000 | ORAL_TABLET | Freq: Every day | ORAL | 3 refills | Status: DC

## 2021-09-17 NOTE — Telephone Encounter (Signed)
Pt states she contacted her Pharmacy and s requesting her medication be sent to the pharmacy listed below not to [facility].   lisinopril-hydrochlorothiazide (ZESTORETIC) 10-12.5 MG tablet Medication  [id number] - [place], [address] [place] (Ph: [phone])

## 2021-09-29 ENCOUNTER — Encounter: Payer: Self-pay | Admitting: Gastroenterology

## 2021-09-29 ENCOUNTER — Other Ambulatory Visit: Payer: Self-pay | Admitting: Student

## 2021-09-29 DIAGNOSIS — Z1231 Encounter for screening mammogram for malignant neoplasm of breast: Secondary | ICD-10-CM

## 2021-10-03 ENCOUNTER — Encounter: Payer: Self-pay | Admitting: Gastroenterology

## 2021-10-10 ENCOUNTER — Ambulatory Visit
Admission: RE | Admit: 2021-10-10 | Discharge: 2021-10-10 | Disposition: A | Discharge status: Home / Self Care | Payer: Commercial Managed Care - HMO | Source: Ambulatory Visit | Attending: *Deleted | Admitting: *Deleted

## 2021-10-10 DIAGNOSIS — Z1231 Encounter for screening mammogram for malignant neoplasm of breast: Secondary | ICD-10-CM

## 2021-10-31 ENCOUNTER — Ambulatory Visit (HOSPITAL_COMMUNITY)
Admission: EM | Admit: 2021-10-31 | Discharge: 2021-10-31 | Disposition: A | Discharge status: Home / Self Care | Payer: Commercial Managed Care - HMO | Attending: Nurse Practitioner | Admitting: Nurse Practitioner

## 2021-10-31 ENCOUNTER — Encounter (HOSPITAL_COMMUNITY): Payer: Self-pay

## 2021-10-31 ENCOUNTER — Encounter: Payer: Commercial Managed Care - HMO | Admitting: Gastroenterology

## 2021-10-31 DIAGNOSIS — Z20822 Contact with and (suspected) exposure to covid-19: Secondary | ICD-10-CM | POA: Insufficient documentation

## 2021-10-31 LAB — SARS CORONAVIRUS 2 BY RT PCR: SARS Coronavirus 2 by RT PCR: NEGATIVE

## 2021-10-31 NOTE — Discharge Instructions (Addendum)
Your COVID is pending We will call you with results   You have declined treatment at this time, if positive treatment will be Molnupiravir 800 mg Twice a day   We encourage conservative treatment with symptom relief. We encourage you to use Tylenol alternating with Ibuprofen for your fever if not contraindicated. (Remember to use as directed do not exceed daily dosing recommendations) We also encourage salt water gargles for your sore throat. You should also consider throat lozenges and chloraseptic spray.  Your cough can be soothed with a cough suppressant. We have prescribed you a cough suppressant to be taken as  directed.

## 2021-10-31 NOTE — ED Triage Notes (Signed)
Pt has a cough on and off over 1wk. Pt daughter was diagnosed with covid yesterday. Pt denies fever, fatigue.

## 2021-10-31 NOTE — ED Provider Notes (Signed)
[place]    CSN: [id number] Arrival date & time: 10/31/21  4627      History   Chief Complaint Chief Complaint  Patient presents with   Cough    HPI ZOLA RUNION is a 61 y.o. female.   HPI  SUBJECTIVE:  NATALIYA GRAIG is a 61 y.o. female who complains of congestion and productive cough for 7-10 days. She denies a history of chest pain, chills, dizziness, fevers, nausea, shortness of breath, vomiting, weight loss, and wheezing and denies a history of asthma. Patient denies smoke cigarettes. She was in close contact with her daughter who tested positive for COVID.    Past Medical History:  Diagnosis Date   Allergy    HIV infection ([place])    Hyperlipidemia    Hypertension    Insomnia 12/19/2014    Patient Active Problem List   Diagnosis Date Noted   Left arm pain 02/17/2021   Iron deficiency anemia 08/08/2020   Healthcare maintenance 08/07/2020   Fatigue 07/12/2020   Screening for cervical cancer 02/21/2020   Multinodular goiter 06/01/2019   Elevated liver function tests 04/22/2018   Hyperthyroidism 01/27/2018   Hoarseness 01/27/2018   Atrial fibrillation ([place]) 10/21/2017   Hyperlipidemia 10/21/2017   Insomnia 12/19/2014   Allergic rhinitis 12/28/2012   Dyslipidemia 07/31/2010   Human immunodeficiency virus (HIV) disease ([place]) 01/19/2008   PERIPHERAL NEUROPATHY 01/19/2008   Essential hypertension 01/19/2008    Past Surgical History:  Procedure Laterality Date   Broken Arm Left    Pins placed in arm   TUBAL LIGATION      OB History     Gravida  2   Para      Term      Preterm      AB      Living  2      SAB      IAB      Ectopic      Multiple      Live Births               Home Medications    Prior to Admission medications   Medication Sig Start Date End Date Taking? Authorizing Provider  bictegravir-emtricitabine-tenofovir AF (BIKTARVY) 50-200-25 MG TABS tablet Take 1 tablet by mouth daily. 02/17/21   [place] Callas, NP  ferrous sulfate 325 (65 FE) MG tablet TAKE 1 TABLET (325 MG TOTAL) BY MOUTH DAILY. TAKE WITH FOOD AND WITH BIKTARVY. 11/05/20 02/03/21  Sanjuan Dame, MD  ibuprofen (ADVIL) 800 MG tablet Take 1 tablet (800 mg total) by mouth every 8 (eight) hours as needed. Make sure to take with food. 08/06/21   Sanjuan Dame, MD  lisinopril-hydrochlorothiazide (ZESTORETIC) 10-12.5 MG tablet Take 1 tablet by mouth daily. 09/17/21   Sanjuan Dame, MD  metoprolol succinate (TOPROL XL) 25 MG 24 hr tablet Take 1 tablet (25 mg total) by mouth daily. 06/21/19   Belva Crome, MD  omeprazole (PRILOSEC) 20 MG capsule Take 1 capsule (20 mg total) by mouth 2 (two) times daily before a meal for 28 days, THEN 1 capsule (20 mg total) daily for 28 days. 07/12/20 09/06/20  Sultana Callas, NP  rivaroxaban (XARELTO) 20 MG TABS tablet Take 1 tablet (20 mg total) by mouth daily with supper. 08/14/21   Belva Crome, MD  traZODone (DESYREL) 50 MG tablet Take 0.5 tablets (25 mg total) by mouth at bedtime. 10/24/19   Campbell Riches, MD  Vitamin D, Ergocalciferol, 50000  units CAPS Take 1 capsule by mouth once a week. 07/22/20   [place] Callas, NP    Family History Family History  Problem Relation Age of Onset   Cancer Mother        uterine vs ovarian?   Seizures Father    Mental illness Brother    Breast cancer Maternal Aunt    Diabetes Daughter    Colon polyps Neg Hx    Esophageal cancer Neg Hx    Rectal cancer Neg Hx    Stomach cancer Neg Hx    Thyroid disease Neg Hx     Social History Social History   Tobacco Use   Smoking status: Former    Packs/day: 2.00    Years: 20.00    Total pack years: 40.00    Types: Cigarettes    Quit date: 2004    Years since quitting: 19.6   Smokeless tobacco: Never   Tobacco comments:    20 years ago  Vaping Use   Vaping Use: Never used  Substance Use Topics   Alcohol use: Yes    Alcohol/week: 6.0 standard drinks of alcohol    Types: 6 Cans of  beer per week   Drug use: Yes    Frequency: 0.5 times per week    Types: Marijuana     Allergies   Patient has no known allergies.   Review of Systems Review of Systems   Physical Exam Triage Vital Signs ED Triage Vitals  Enc Vitals Group     BP 10/31/21 0931 130/79     Pulse Rate 10/31/21 0931 72     Resp 10/31/21 0931 12     Temp 10/31/21 0931 98 F (36.7 C)     Temp Source 10/31/21 0931 Oral     SpO2 10/31/21 0931 100 %     Weight 10/31/21 0935 155 lb (70.3 kg)     Height 10/31/21 0935 '5\' 6"'$  (1.676 m)     Head Circumference --      Peak Flow --      Pain Score 10/31/21 0935 0     Pain Loc --      Pain Edu? --      Excl. in [place]? --    No data found.  Updated Vital Signs BP 130/79 (BP Location: Left Arm)   Pulse 72   Temp 98 F (36.7 C) (Oral)   Resp 12   Ht '5\' 6"'$  (1.676 m)   Wt 155 lb (70.3 kg)   SpO2 100%   BMI 25.02 kg/m   Visual Acuity Right Eye Distance:   Left Eye Distance:   Bilateral Distance:    Right Eye Near:   Left Eye Near:    Bilateral Near:     Physical Exam Constitutional:      Appearance: She is normal weight.  HENT:     Head: Normocephalic and atraumatic.     Nose: Nose normal.     Mouth/Throat:     Mouth: Mucous membranes are moist.  Cardiovascular:     Rate and Rhythm: Normal rate and regular rhythm.     Pulses: Normal pulses.     Heart sounds: Normal heart sounds.  Pulmonary:     Effort: Pulmonary effort is normal.     Breath sounds: No wheezing, rhonchi or rales.  Musculoskeletal:        General: Normal range of motion.     Cervical back: Normal range of motion.  Skin:    General:  Skin is warm.     Capillary Refill: Capillary refill takes less than 2 seconds.  Neurological:     General: No focal deficit present.     Mental Status: She is alert and oriented to person, place, and time.  Psychiatric:        Mood and Affect: Mood normal.        Behavior: Behavior normal.      UC Treatments / Results   Labs (all labs ordered are listed, but only abnormal results are displayed) Labs Reviewed  SARS CORONAVIRUS 2 BY RT PCR    EKG   Radiology No results found.  Procedures Procedures (including critical care time)  Medications Ordered in UC Medications - No data to display  Initial Impression / Assessment and Plan / UC Course  I have reviewed the triage vital signs and the nursing notes.  Pertinent labs & imaging results that were available during my care of the patient were reviewed by me and considered in my medical decision making (see chart for details).    Cough COVID exposure  Final Clinical Impressions(s) / UC Diagnoses   Final diagnoses:  Close exposure to COVID-19 virus     Discharge Instructions      Your COVID is pending We will call you with results   You have declined treatment at this time, if positive treatment will be Molnupiravir 800 mg Twice a day   We encourage conservative treatment with symptom relief. We encourage you to use Tylenol alternating with Ibuprofen for your fever if not contraindicated. (Remember to use as directed do not exceed daily dosing recommendations) We also encourage salt water gargles for your sore throat. You should also consider throat lozenges and chloraseptic spray.  Your cough can be soothed with a cough suppressant. We have prescribed you a cough suppressant to be taken as  directed.       ED Prescriptions   None    PDMP not reviewed this encounter.   Dionisio David [place], NP 10/31/21 1001

## 2021-11-03 ENCOUNTER — Encounter: Payer: Self-pay | Admitting: Gastroenterology

## 2021-11-03 ENCOUNTER — Ambulatory Visit (INDEPENDENT_AMBULATORY_CARE_PROVIDER_SITE_OTHER): Payer: Commercial Managed Care - HMO | Admitting: Gastroenterology

## 2021-11-03 VITALS — BP 118/72 | HR 78 | Ht 66.0 in | Wt 168.1 lb

## 2021-11-03 DIAGNOSIS — Z7902 Long term (current) use of antithrombotics/antiplatelets: Secondary | ICD-10-CM

## 2021-11-03 DIAGNOSIS — Z86010 Personal history of colonic polyps: Secondary | ICD-10-CM

## 2021-11-03 MED ORDER — PLENVU 140 G PO SOLR: 140.0000 g | ORAL | 0 refills | Status: DC

## 2021-11-03 NOTE — Progress Notes (Signed)
[facility] Consult Note:  History: Vicki Brown 11/03/2021  Referring provider: Sanjuan Dame, MD  Reason for consult/chief complaint: Colonoscopy (Discuss having pt states she is fine.)   Subjective  HPI: Vicki Brown was here today to discuss a surveillance colonoscopy because of her history of colon polyp.  She has atrial fibrillation and is on long-term oral anticoagulation for stroke prevention. On her first screening colonoscopy in June 2018, a 6 mm sessile serrated polyp was removed.  From her cardiology provider's most recent office note in April 2023: "Vicki Brown is a 61 y.o. female with a hx of paroxysmal atrial fibrillation, hypertension, hyperlipidemia and HIV seen for follow-up.   Stress test 10/2017 was low risk.  Echocardiogram April 2021 showed LV function of 60 to 65% and no regional wall motion abnormality.    Recently changed Xarelto to warfarin due to cost.   Here today for follow-up.  No complaints.  She walks around mall without chest pain or shortness of breath.  Compliant with her medications.  Denies dizziness, palpitation, orthopnea, PND, syncope, lower extremity edema or melena" ____________  Bishop [place] denies chronic abdominal pain, altered bowel habits or rectal bleeding.  She was ultimately changed back to Xarelto and is doing well on that.  Incidentally, [name] was at urgent care 3 days ago for 7 to 10 days of cough after being exposed to her daughter who tested COVID-positive.  She tested negative that day. ROS:  Review of Systems Nonproductive cough from recent respiratory infection. Remainder systems negative except as above Denies chest pain or dyspnea with exertion  Past Medical History: Past Medical History:  Diagnosis Date   Allergy    HIV infection ([place])    Hyperlipidemia    Hypertension    Insomnia 12/19/2014     Past Surgical History: Past Surgical History:  Procedure Laterality Date   Broken Arm Left     Pins placed in arm   TUBAL LIGATION       Family History: Family History  Problem Relation Age of Onset   Cancer Mother        uterine vs ovarian?   Seizures Father    Mental illness Brother    Breast cancer Maternal Aunt    Diabetes Daughter    Colon polyps Neg Hx    Esophageal cancer Neg Hx    Rectal cancer Neg Hx    Stomach cancer Neg Hx    Thyroid disease Neg Hx     Social History: Social History   Socioeconomic History   Marital status: Single    Spouse name: Not on file   Number of children: Not on file   Years of education: Not on file   Highest education level: Not on file  Occupational History   Not on file  Tobacco Use   Smoking status: Former    Packs/day: 2.00    Years: 20.00    Total pack years: 40.00    Types: Cigarettes    Quit date: 2004    Years since quitting: 19.6   Smokeless tobacco: Never   Tobacco comments:    20 years ago  Vaping Use   Vaping Use: Never used  Substance and Sexual Activity   Alcohol use: Yes    Alcohol/week: 6.0 standard drinks of alcohol    Types: 6 Cans of beer per week   Drug use: Yes    Frequency: 0.5 times per week    Types: Marijuana   Sexual activity: Yes  Partners: Male    Birth control/protection: Condom    Comment: declined condoms 07/12/20  Other Topics Concern   Not on file  Social History Narrative   Not on file   Social Determinants of Health   Financial Resource Strain: Not on file  Food Insecurity: Not on file  Transportation Needs: Not on file  Physical Activity: Not on file  Stress: Not on file  Social Connections: Not on file    Allergies: No Known Allergies  Outpatient Meds: Current Outpatient Medications  Medication Sig Dispense Refill   bictegravir-emtricitabine-tenofovir AF (BIKTARVY) 50-200-25 MG TABS tablet Take 1 tablet by mouth daily. 30 tablet 11   ibuprofen (ADVIL) 800 MG tablet Take 1 tablet (800 mg total) by mouth every 8 (eight) hours as needed. Make sure to take  with food. 21 tablet 0   lisinopril-hydrochlorothiazide (ZESTORETIC) 10-12.5 MG tablet Take 1 tablet by mouth daily. 90 tablet 3   metoprolol succinate (TOPROL XL) 25 MG 24 hr tablet Take 1 tablet (25 mg total) by mouth daily. 90 tablet 3   rivaroxaban (XARELTO) 20 MG TABS tablet Take 1 tablet (20 mg total) by mouth daily with supper. 90 tablet 1   traZODone (DESYREL) 50 MG tablet Take 0.5 tablets (25 mg total) by mouth at bedtime. 30 tablet 3   Vitamin D, Ergocalciferol, 50000 units CAPS Take 1 capsule by mouth once a week. 8 capsule 0   ferrous sulfate 325 (65 FE) MG tablet TAKE 1 TABLET (325 MG TOTAL) BY MOUTH DAILY. TAKE WITH FOOD AND WITH BIKTARVY. 90 tablet 0   omeprazole (PRILOSEC) 20 MG capsule Take 1 capsule (20 mg total) by mouth 2 (two) times daily before a meal for 28 days, THEN 1 capsule (20 mg total) daily for 28 days. 84 capsule 0   No current facility-administered medications for this visit.      ___________________________________________________________________ Objective   Exam:  BP 118/72   Pulse 78   Ht '5\' 6"'$  (1.676 m)   Wt 168 lb 2 oz (76.3 kg)   BMI 27.14 kg/m  Wt Readings from Last 3 Encounters:  11/03/21 168 lb 2 oz (76.3 kg)  10/31/21 155 lb (70.3 kg)  07/03/21 170 lb (77.1 kg)    General: Well-appearing Eyes: sclera anicteric, no redness ENT: Wearing a mask today due to respiratory symptoms CV: Regular without murmur, no JVD, no peripheral edema Resp: clear to auscultation bilaterally, normal RR and effort noted GI: soft, no tenderness, with active bowel sounds. No guarding or palpable organomegaly noted. Skin; warm and dry, no rash or jaundice noted Neuro: awake, alert and oriented x 3. Normal gross motor function and fluent speech  Labs:   Assessment: Encounter Diagnoses  Name Primary?   Personal history of colonic polyps Yes   Long term (current) use of antithrombotics/antiplatelets     Due for surveillance colonoscopy with a history of  serrated sessile polyp 5 years ago.  She was agreeable after discussion of procedure and risks.  The benefits and risks of the planned procedure were described in detail with the patient or (when appropriate) their health care proxy.  Risks were outlined as including, but not limited to, bleeding, infection, perforation, adverse medication reaction leading to cardiac or pulmonary decompensation, pancreatitis (if ERCP).  The limitation of incomplete mucosal visualization was also discussed.  No guarantees or warranties were given.  2-day Xarelto hold, she is aware of stroke risk during the brief hold of anticoagulation.  We will clear this with her  cardiologist.   Thank you for the courtesy of this consult.  Please call me with any questions or concerns.  Nelida Meuse III  CC: Referring provider noted above

## 2021-11-03 NOTE — Patient Instructions (Signed)
_______________________________________________________  If you are age 63 or older, your body mass index should be between 23-30. Your Body mass index is 27.14 kg/m. If this is out of the aforementioned range listed, please consider follow up with your Primary Care Provider.  If you are age 46 or younger, your body mass index should be between 19-25. Your Body mass index is 27.14 kg/m. If this is out of the aformentioned range listed, please consider follow up with your Primary Care Provider.   ________________________________________________________  The [facility] providers would like to encourage you to use [facility] to communicate with providers for non-urgent requests or questions.  Due to long hold times on the telephone, sending your provider a message by [facility] may be a faster and more efficient way to get a response.  Please allow 48 business hours for a response.  Please remember that this is for non-urgent requests.  _______________________________________________________  Dennis Bast have been scheduled for a colonoscopy. Please follow written instructions given to you at your visit today.  Please pick up your prep supplies at the pharmacy within the next 1-3 days. If you use inhalers (even only as needed), please bring them with you on the day of your procedure.  It was a pleasure to see you today!  Thank you for trusting me with your gastrointestinal care!

## 2021-11-13 ENCOUNTER — Encounter: Payer: Self-pay | Admitting: Gastroenterology

## 2021-11-18 ENCOUNTER — Encounter: Payer: Self-pay | Admitting: Gastroenterology

## 2021-11-18 ENCOUNTER — Ambulatory Visit (AMBULATORY_SURGERY_CENTER): Payer: Commercial Managed Care - HMO | Admitting: Gastroenterology

## 2021-11-18 VITALS — BP 101/78 | HR 85 | Temp 96.8°F | Resp 19 | Ht 66.0 in | Wt 168.0 lb

## 2021-11-18 DIAGNOSIS — Z86010 Personal history of colonic polyps: Secondary | ICD-10-CM | POA: Diagnosis not present

## 2021-11-18 DIAGNOSIS — Z09 Encounter for follow-up examination after completed treatment for conditions other than malignant neoplasm: Secondary | ICD-10-CM

## 2021-11-18 MED ORDER — SODIUM CHLORIDE 0.9 % IV SOLN: 500.0000 mL | Freq: Once | INTRAVENOUS | Status: DC

## 2021-11-18 NOTE — Progress Notes (Signed)
Pt's states no medical or surgical changes since previsit or office visit. 

## 2021-11-18 NOTE — Op Note (Addendum)
[facility] Patient Name: Vicki Brown Procedure Date: 11/18/2021 10:25 AM MRN: [id number] Endoscopist: [place]. Loletha Carrow , MD Age: 61 Referring MD:  Date of Birth: [birth year] Gender: Female Account #: [id number] Procedure:                Colonoscopy Indications:              Increased risk colon cancer surveillance: Personal                            history of sessile serrated colon polyp (less than                            10 mm in size) with no dysplasia                           59m SSP June 2018 Medicines:                Monitored Anesthesia Care Procedure:                Pre-Anesthesia Assessment:                           - Prior to the procedure, a History and Physical                            was performed, and patient medications and                            allergies were reviewed. The patient's tolerance of                            previous anesthesia was also reviewed. The risks                            and benefits of the procedure and the sedation                            options and risks were discussed with the patient.                            All questions were answered, and informed consent                            was obtained. Prior Anticoagulants: The patient has                            taken Xarelto (rivaroxaban), last dose was 2 days                            prior to procedure. ASA Grade Assessment: II - A                            patient with mild systemic disease. After reviewing  the risks and benefits, the patient was deemed in                            satisfactory condition to undergo the procedure.                           After obtaining informed consent, the colonoscope                            was passed under direct vision. Throughout the                            procedure, the patient's blood pressure, pulse, and                            oxygen saturations were monitored  continuously. The                            Olympus PCF-[id number] (#[id number]) Colonoscope was                            introduced through the anus and advanced to the the                            cecum, identified by appendiceal orifice and                            ileocecal valve. The colonoscopy was performed                            without difficulty. The patient tolerated the                            procedure well. The quality of the bowel                            preparation was excellent. The ileocecal valve,                            appendiceal orifice, and rectum were photographed. Scope In: 10:34:20 AM Scope Out: 10:44:27 AM Scope Withdrawal Time: 0 hours 8 minutes 5 seconds  Total Procedure Duration: 0 hours 10 minutes 7 seconds  Findings:                 The perianal and digital rectal examinations were                            normal.                           Repeat examination of right colon under NBI                            performed.  A few small-mouthed diverticula were found in the                            left colon and right colon.                           There is no endoscopic evidence of polyps in the                            entire colon.                           Internal hemorrhoids were found. The hemorrhoids                            were small.                           The exam was otherwise without abnormality on                            direct and retroflexion views. Complications:            No immediate complications. Estimated Blood Loss:     Estimated blood loss: none. Impression:               - Diverticulosis in the left colon and in the right                            colon.                           - Internal hemorrhoids.                           - The examination was otherwise normal on direct                            and retroflexion views.                           - No specimens  collected. Recommendation:           - Patient has a contact number available for                            emergencies. The signs and symptoms of potential                            delayed complications were discussed with the                            patient. Return to normal activities tomorrow.                            Written discharge instructions were provided to the  patient.                           - Resume previous diet.                           - Continue present medications. Resume Xarelto at                            your usual dose today.                           - Repeat colonoscopy in 10 years for screening                            purposes. Marisah Laker L. Loletha Carrow, MD 11/18/2021 10:48:27 AM This report has been signed electronically.

## 2021-11-18 NOTE — Patient Instructions (Signed)
Handout on hemorrhoids and diverticulosis  Resume previous diet and continue present medications - resume Xarelto at previous dose today  Repeat colonoscopy in 10 years for surveillance!   YOU HAD AN ENDOSCOPIC PROCEDURE TODAY AT [facility]:   Refer to the procedure report that was given to you for any specific questions about what was found during the examination.  If the procedure report does not answer your questions, please call your gastroenterologist to clarify.  If you requested that your care partner not be given the details of your procedure findings, then the procedure report has been included in a sealed envelope for you to review at your convenience later.  YOU SHOULD EXPECT: Some feelings of bloating in the abdomen. Passage of more gas than usual.  Walking can help get rid of the air that was put into your GI tract during the procedure and reduce the bloating. If you had a lower endoscopy (such as a colonoscopy or flexible sigmoidoscopy) you may notice spotting of blood in your stool or on the toilet paper. If you underwent a bowel prep for your procedure, you may not have a normal bowel movement for a few days.  Please Note:  You might notice some irritation and congestion in your nose or some drainage.  This is from the oxygen used during your procedure.  There is no need for concern and it should clear up in a day or so.  SYMPTOMS TO REPORT IMMEDIATELY:  Following lower endoscopy (colonoscopy or flexible sigmoidoscopy):  Excessive amounts of blood in the stool  Significant tenderness or worsening of abdominal pains  Swelling of the abdomen that is new, acute  Fever of 100F or higher  For urgent or emergent issues, a gastroenterologist can be reached at any hour by calling [phone]. Do not use MyChart messaging for urgent concerns.    DIET:  We do recommend a small meal at first, but then you may proceed to your regular diet.  Drink plenty of fluids but  you should avoid alcoholic beverages for 24 hours.  ACTIVITY:  You should plan to take it easy for the rest of today and you should NOT DRIVE or use heavy machinery until tomorrow (because of the sedation medicines used during the test).    FOLLOW UP: Our staff will call the number listed on your records the next business day following your procedure.  We will call around 7:15- 8:00 am to check on you and address any questions or concerns that you may have regarding the information given to you following your procedure. If we do not reach you, we will leave a message.     If any biopsies were taken you will be contacted by phone or by letter within the next 1-3 weeks.  Please call us at [phone] if you have not heard about the biopsies in 3 weeks.    SIGNATURES/CONFIDENTIALITY: You and/or your care partner have signed paperwork which will be entered into your electronic medical record.  These signatures attest to the fact that that the information above on your After Visit Summary has been reviewed and is understood.  Full responsibility of the confidentiality of this discharge information lies with you and/or your care-partner.

## 2021-11-18 NOTE — Progress Notes (Signed)
A and O x3. Report to RN. Tolerated MAC anesthesia well. 

## 2021-11-18 NOTE — Progress Notes (Signed)
No changes to clinical history since GI office visit on 11/03/21.  The patient is appropriate for an endoscopic procedure in the ambulatory setting.  - Wilfrid Lund, MD

## 2021-11-19 ENCOUNTER — Telehealth: Payer: Self-pay

## 2021-11-19 NOTE — Telephone Encounter (Signed)
  Follow up Call-     11/18/2021   10:18 AM  Call back number  Post procedure Call Back phone  # [phone]  Permission to leave phone message Yes     Post op call attempted, no answer, left WM.

## 2021-12-12 ENCOUNTER — Ambulatory Visit (INDEPENDENT_AMBULATORY_CARE_PROVIDER_SITE_OTHER): Payer: Commercial Managed Care - HMO | Admitting: Internal Medicine

## 2021-12-12 ENCOUNTER — Other Ambulatory Visit: Payer: Self-pay

## 2021-12-12 ENCOUNTER — Encounter: Payer: Self-pay | Admitting: Internal Medicine

## 2021-12-12 DIAGNOSIS — H9193 Unspecified hearing loss, bilateral: Secondary | ICD-10-CM

## 2021-12-12 NOTE — Progress Notes (Signed)
   CC: hearing trouble  HPI:[name] is a 61 y.o. female who presents for evaluation of hearing. Please see individual problem based A/P for details.  Patient reporting bilateral hearing loss. Having trouble hearing parts of conversations. Noticed she is having to turn the TV volume up. She noticed this change several months ago. No pain, ringing, dizziness. No reported cold symptoms and doesn't feel congested. No fever or chills. No new vision symptoms. Never had head imaging. Does use q-tips frequently.  Depression, PHQ-9: Based on the patients  [place] Visit from 08/07/2020 in [place]  PHQ-9 Total Score 3      score we have .  Past Medical History:  Diagnosis Date   Allergy    HIV infection ([place])    Hyperlipidemia    Hypertension    Insomnia 12/19/2014   Review of Systems:   See HPI  Physical Exam: There were no vitals filed for this visit.   General: NAD HEENT Left ear is without external injury. Internal ear canal without any inflammation. Unable to visualize tympanic membrane due to cerumen impaction. Right ear 50-75% obscured by cerumen. Nose and throat clear. No lymph nodes palpable.  Cardiovascular: Normal rate, regular rhythm.  No murmurs, rubs, or gallops Pulmonary : Equal breath sounds, No wheezes, rales, or rhonchi Abdominal: soft, nontender,  bowel sounds present Ext: No edema in lower extremities, no tenderness to palpation of lower extremities.   Assessment & Plan:   See Encounters Tab for problem based charting.  Hearing loss may be multifactorial. She likely has some degree of underlying presbycusis. More recent decline may be related to wax impaction. Unable to irrigate ears completely during visit. Encouraged OTC debrox use.   Patient discussed with Dr. Dareen Piano

## 2021-12-12 NOTE — Patient Instructions (Addendum)
Dear Vicki Brown  Thank you for trusting [place] with your care. Your hearing trouble may be related to the significant amount of ear wax in your ears. We tried to flush them but were not able to get everything. We recommend some debrox to help soften the wax and break it up. The hearing changes may also be related to age related hearing loss.   Please return in 1 month.

## 2021-12-15 ENCOUNTER — Encounter: Payer: Self-pay | Admitting: Internal Medicine

## 2021-12-15 DIAGNOSIS — H9190 Unspecified hearing loss, unspecified ear: Secondary | ICD-10-CM | POA: Insufficient documentation

## 2021-12-15 NOTE — Assessment & Plan Note (Signed)
Patient reporting bilateral hearing loss. Having trouble hearing parts of conversations. Noticed she is having to turn the TV volume up. She noticed this change several months ago. No pain, ringing, dizziness. No reported cold symptoms and doesn't feel congested. No fever or chills. No new vision symptoms. Never had head imaging. Does use q-tips frequently.  Left ear is without external injury. Internal ear canal without any inflammation. Unable to visualize tympanic membrane due to cerumen impaction. Right ear 50-75% obscured by cerumen. Nose and throat clear. No lymph nodes palpable.   Hearing loss may be multifactorial. She likely has some degree of underlying presbycusis. More recent decline may be related to wax impaction. Unable to irrigate ears completely during visit. Encouraged OTC debrox use.

## 2021-12-16 NOTE — Progress Notes (Signed)
Internal Medicine Clinic Attending ° °Case discussed with Dr. Gawaluck  At the time of the visit.  We reviewed the resident’s history and exam and pertinent patient test results.  I agree with the assessment, diagnosis, and plan of care documented in the resident’s note.  °

## 2022-02-20 ENCOUNTER — Other Ambulatory Visit: Payer: Self-pay | Admitting: Infectious Diseases

## 2022-02-20 DIAGNOSIS — B20 Human immunodeficiency virus [HIV] disease: Secondary | ICD-10-CM

## 2022-02-26 ENCOUNTER — Telehealth: Payer: Self-pay

## 2022-02-26 NOTE — Telephone Encounter (Signed)
Received voicemail from patient requesting callback for assistance with medication refills. Called [name], no answer. Left HIPAA compliant voicemail requesting callback.   Beryle Flock, RN

## 2022-02-26 NOTE — Telephone Encounter (Signed)
Patient returned call back. Patient requesting refill on Atorvastatin. Patient has not filled this since 2021. I advised patient we will discuss this medication at her upcoming appointment with [facility].

## 2022-03-16 ENCOUNTER — Other Ambulatory Visit: Payer: Self-pay

## 2022-03-16 ENCOUNTER — Ambulatory Visit (INDEPENDENT_AMBULATORY_CARE_PROVIDER_SITE_OTHER): Payer: 59 | Admitting: Infectious Diseases

## 2022-03-16 ENCOUNTER — Ambulatory Visit (INDEPENDENT_AMBULATORY_CARE_PROVIDER_SITE_OTHER): Payer: Commercial Managed Care - HMO

## 2022-03-16 ENCOUNTER — Encounter: Payer: Self-pay | Admitting: Infectious Diseases

## 2022-03-16 VITALS — BP 117/82 | HR 74 | Temp 98.7°F | Ht 66.0 in | Wt 174.0 lb

## 2022-03-16 DIAGNOSIS — I4811 Longstanding persistent atrial fibrillation: Secondary | ICD-10-CM

## 2022-03-16 DIAGNOSIS — I10 Essential (primary) hypertension: Secondary | ICD-10-CM

## 2022-03-16 DIAGNOSIS — R69 Illness, unspecified: Secondary | ICD-10-CM | POA: Diagnosis not present

## 2022-03-16 DIAGNOSIS — Z23 Encounter for immunization: Secondary | ICD-10-CM

## 2022-03-16 DIAGNOSIS — R7989 Other specified abnormal findings of blood chemistry: Secondary | ICD-10-CM

## 2022-03-16 DIAGNOSIS — Z0000 Encounter for general adult medical examination without abnormal findings: Secondary | ICD-10-CM

## 2022-03-16 DIAGNOSIS — B20 Human immunodeficiency virus [HIV] disease: Secondary | ICD-10-CM

## 2022-03-16 DIAGNOSIS — E785 Hyperlipidemia, unspecified: Secondary | ICD-10-CM

## 2022-03-16 DIAGNOSIS — Z87891 Personal history of nicotine dependence: Secondary | ICD-10-CM | POA: Diagnosis not present

## 2022-03-16 MED ORDER — BIKTARVY 50-200-25 MG PO TABS: 1.0000 | ORAL_TABLET | Freq: Every day | ORAL | 11 refills | Status: DC

## 2022-03-16 MED ORDER — ATORVASTATIN CALCIUM 10 MG PO TABS: 40.0000 mg | ORAL_TABLET | Freq: Every day | ORAL | 11 refills | Status: DC

## 2022-03-16 NOTE — Assessment & Plan Note (Signed)
Vision exam discussed - [facility] if she has insurance, [employer] if not.   COVID booster today. She declined prevnar 20 for now. May reconsider at later time Pap smear due Dec 2024 - will have her schedule with me.

## 2022-03-16 NOTE — Assessment & Plan Note (Signed)
Very well controlled on once daily Biktarvy. No concerns with access or adherence to medication. They are tolerating the medication well without side effects. Counseled to avoid Ibuprofen due to HTN, xaralto use and TAF ingredient in [place]. She will use Tylenol instead.  No changes to insurance coverage.  No dental needs today.  No concern over anxious/depressed mood.  Sexual health and family planning discussed - no needs today.  Vaccines updated today - see health maintenance section.   Return in about 11 months (around 02/07/2023) for follow up.

## 2022-03-16 NOTE — Assessment & Plan Note (Signed)
She requested refill of atorvastatin today - pulled out a bottle from 2019 10 mg tabs.  We discarded this expired bottle and provided refill today same dose.   With new research coming out by way of the [place] study regarding cardiovascular event risk reduction for PLWH, I discussed that over the 8 year trial initiation of statin (pitavastatin) therapy was shown to reduce CV disease by 35% independent of other risk factors.    The 10-year ASCVD risk score (Arnett DK, et al., 2019) is: 6.4%   Values used to calculate the score:     Age: 62 years     Sex: Female     Is Non-Hispanic African American: Yes     Diabetic: No     Tobacco smoker: No     Systolic Blood Pressure: 742 mmHg     Is BP treated: Yes     HDL Cholesterol: 81 mg/dL     Total Cholesterol: 232 mg/dL   We discussed the patient's 10-year CVD Risk Score to be at least moderately elevated, and would benefit from intervention given HIV+ and > 38 yo with dyslipidemia and atrial fib risk factors.   Will proceed with refill of low intensity atorvastatin and check lipid panel today.

## 2022-03-16 NOTE — Assessment & Plan Note (Signed)
BP Readings from Last 3 Encounters:  03/16/22 117/82  12/12/21 117/86  11/18/21 101/78   She has refills available to her - she is switching to [facility] and wellness clinic upstairs for better continuity so she may need bridge refill.

## 2022-03-16 NOTE — Patient Instructions (Addendum)
Will send in refill for your biktarvy.   For aches and pains, try taking two extra strength tylenol 1 or 2 times a day. *Can stop at the pharmacy here in our building to get a bottle for $2.50  For primary care services, please call for a new patient appointment:   Vicki Brown.  [phone]  For a vision check -  [facility]  Address: [address], [place], [place] [address] Phone: [phone]   Call Dr. Thompson Caul office to help with future appointments and Xarelto assistance  Phone: [phone]

## 2022-03-16 NOTE — Assessment & Plan Note (Signed)
Improved in March - correlates with alcohol use. We discussed this in detail today and she seems motivated to stay off heavy ETOH use for long term health. CMP today

## 2022-03-16 NOTE — Progress Notes (Signed)
Name: Vicki Brown  DOB: 10-15-1960 MRN: [id number] PCP: Sanjuan Dame, MD     Brief Narrative:  Vicki Brown is a 62 y.o. female with HIV infection, Dx March 2009.  History of OIs: none. HIV Risk Factor: Sexual   Previous Regimens: Atripla Biktarvy  Genotypes: No RT/PI mutations in past   Subjective:   Chief Complaint  Patient presents with   Follow-up    Atorvastatin, ibuprofen, xerelto refills/ referral for ophthalmology      HPI: [name] is here today for 69mfollow up. Doing well and without any complaints today. Needs a few refills if we can help. Atorvastatin, ibuprofen and xarelto. She has a hard time with getting the xarelto filled as she is on patient assistance for this. Her cardiologist is retiring but she is not quite sure where new office/provider will be. Has not seen them in a while.   No hospital or ER visits in [facility] system per review.  Note re: Xarelto assistance through [employer] and cards clinic.   Needs refill on Ibuprofen. Takes occasionally for body aches / pains. She usually breaks this in half.  Alcohol use has cut back a lot. Had a little slip recently but re-committed to quitting totally.     Review of Systems  Constitutional:  Negative for chills, fever, malaise/fatigue and weight loss.  HENT:  Negative for sore throat.   Respiratory:  Negative for cough, sputum production and shortness of breath.   Cardiovascular:  Negative for chest pain and leg swelling.  Gastrointestinal:  Negative for abdominal pain, diarrhea and vomiting.  Musculoskeletal:  Positive for back pain. Negative for joint pain, myalgias and neck pain.  Skin:  Negative for rash.  Neurological:  Negative for headaches.  Psychiatric/Behavioral:  Negative for depression and substance abuse. The patient is not nervous/anxious.      Past Medical History:  Diagnosis Date   Allergy    HIV infection ([place]    Hyperlipidemia    Hypertension    Insomnia 12/19/2014     Outpatient Medications Prior to Visit  Medication Sig Dispense Refill   lisinopril-hydrochlorothiazide (ZESTORETIC) 10-12.5 MG tablet Take 1 tablet by mouth daily. 90 tablet 3   rivaroxaban (XARELTO) 20 MG TABS tablet Take 1 tablet (20 mg total) by mouth daily with supper. 90 tablet 1   traZODone (DESYREL) 50 MG tablet Take 0.5 tablets (25 mg total) by mouth at bedtime. 30 tablet 3   Vitamin D, Ergocalciferol, 50000 units CAPS Take 1 capsule by mouth once a week. 8 capsule 0   bictegravir-emtricitabine-tenofovir AF (BIKTARVY) 50-200-25 MG TABS tablet TAKE 1 TABLET BY MOUTH DAILY 30 tablet 0   ibuprofen (ADVIL) 800 MG tablet Take 1 tablet (800 mg total) by mouth every 8 (eight) hours as needed. Make sure to take with food. 21 tablet 0   metoprolol succinate (TOPROL XL) 25 MG 24 hr tablet Take 1 tablet (25 mg total) by mouth daily. (Patient not taking: Reported on 03/16/2022) 90 tablet 3   omeprazole (PRILOSEC) 20 MG capsule Take 1 capsule (20 mg total) by mouth 2 (two) times daily before a meal for 28 days, THEN 1 capsule (20 mg total) daily for 28 days. 84 capsule 0   ferrous sulfate 325 (65 FE) MG tablet TAKE 1 TABLET (325 MG TOTAL) BY MOUTH DAILY. TAKE WITH FOOD AND WITH BIKTARVY. 90 tablet 0   No facility-administered medications prior to visit.     No Known Allergies  Social History   Tobacco  Use   Smoking status: Former    Packs/day: 2.00    Years: 20.00    Total pack years: 40.00    Types: Cigarettes    Quit date: 2004    Years since quitting: 20.0   Smokeless tobacco: Never   Tobacco comments:    20 years ago  Vaping Use   Vaping Use: Never used  Substance Use Topics   Alcohol use: Yes    Alcohol/week: 6.0 standard drinks of alcohol    Types: 6 Cans of beer per week   Drug use: Yes    Frequency: 0.5 times per week    Types: Marijuana    Family History  Problem Relation Age of Onset   Cancer Mother        uterine vs ovarian?   Seizures Father    Mental illness  Brother    Breast cancer Maternal Aunt    Diabetes Daughter    Colon polyps Neg Hx    Esophageal cancer Neg Hx    Rectal cancer Neg Hx    Stomach cancer Neg Hx    Thyroid disease Neg Hx     Social History   Substance and Sexual Activity  Sexual Activity Yes   Partners: Male   Birth control/protection: Condom   Comment: declined condoms 07/12/20     Objective:   Vitals:   03/16/22 0929  BP: 117/82  Pulse: 74  Temp: 98.7 F (37.1 C)  TempSrc: Oral  SpO2: 99%  Weight: 174 lb (78.9 kg)  Height: '5\' 6"'$  (1.676 m)   Body mass index is 28.08 kg/m.  Physical Exam Vitals reviewed.  Constitutional:      Appearance: Normal appearance. She is not ill-appearing.  Cardiovascular:     Rate and Rhythm: Normal rate and regular rhythm.     Pulses: Normal pulses.  Abdominal:     General: There is no distension.     Palpations: Abdomen is soft.  Musculoskeletal:     Left shoulder: Tenderness present. Normal range of motion.       Arms:     Cervical back: Tenderness (posterior lower cervical spine tenderness. No abnormalities with external exam.) present. No torticollis. Pain with movement (let lateral rotation does cause pain) present. Normal range of motion.       Back:     Comments: Pain with lateral abduction of left shoulder. Tenderness along anterior deltoid and acromion process. Unable to hold up arm against light resistance. Shooting/tingling pain after exam worsened.   Lymphadenopathy:     Cervical: No cervical adenopathy.  Neurological:     General: No focal deficit present.     Mental Status: She is alert and oriented to person, place, and time.      Lab Results Lab Results  Component Value Date   WBC 3.1 (L) 06/05/2021   HGB 13.2 06/05/2021   HCT 38.4 06/05/2021   MCV 95 06/05/2021   PLT 232 06/05/2021    Lab Results  Component Value Date   CREATININE 1.09 (H) 06/05/2021   BUN 10 06/05/2021   NA 140 06/05/2021   K 3.9 06/05/2021   CL 102 06/05/2021    CO2 21 06/05/2021    Lab Results  Component Value Date   ALT 23 06/05/2021   AST 35 06/05/2021   ALKPHOS 94 06/05/2021   BILITOT 0.2 06/05/2021    Lab Results  Component Value Date   CHOL 232 (H) 02/03/2021   HDL 81 02/03/2021   LDLCALC 126 (H)  02/03/2021   TRIG 131 02/03/2021   CHOLHDL 2.9 02/03/2021   HIV 1 RNA Quant (Copies/mL)  Date Value  02/03/2021 26 (H)  07/12/2020 Not Detected  02/07/2020 <20   CD4 T Cell Abs (/uL)  Date Value  02/07/2020 957  04/13/2019 907  03/31/2018 780     Assessment & Plan:   Problem List Items Addressed This Visit       Unprioritized   Atrial fibrillation ([place]) - Primary    I gave her Dr. Thompson Caul office number and asked her to please follow up with Xarelto assitance with their office so she can continue to get this readily. She will call this week.       Relevant Medications   atorvastatin (LIPITOR) 10 MG tablet   Dyslipidemia    She requested refill of atorvastatin today - pulled out a bottle from 2019 10 mg tabs.  We discarded this expired bottle and provided refill today same dose.   With new research coming out by way of the [place] study regarding cardiovascular event risk reduction for PLWH, I discussed that over the 8 year trial initiation of statin (pitavastatin) therapy was shown to reduce CV disease by 35% independent of other risk factors.    The 10-year ASCVD risk score (Arnett DK, et al., 2019) is: 6.4%   Values used to calculate the score:     Age: 32 years     Sex: Female     Is Non-Hispanic African American: Yes     Diabetic: No     Tobacco smoker: No     Systolic Blood Pressure: 948 mmHg     Is BP treated: Yes     HDL Cholesterol: 81 mg/dL     Total Cholesterol: 232 mg/dL   We discussed the patient's 10-year CVD Risk Score to be at least moderately elevated, and would benefit from intervention given HIV+ and > 11 yo with dyslipidemia and atrial fib risk factors.   Will proceed with refill of low  intensity atorvastatin and check lipid panel today.         Relevant Medications   atorvastatin (LIPITOR) 10 MG tablet   Elevated liver function tests    Improved in March - correlates with alcohol use. We discussed this in detail today and she seems motivated to stay off heavy ETOH use for long term health. CMP today      Essential hypertension    BP Readings from Last 3 Encounters:  03/16/22 117/82  12/12/21 117/86  11/18/21 101/78  She has refills available to her - she is switching to [facility] and wellness clinic upstairs for better continuity so she may need bridge refill.       Relevant Medications   atorvastatin (LIPITOR) 10 MG tablet   Healthcare maintenance    Vision exam discussed - [place] if she has insurance, [facility] if not.   COVID booster today. She declined prevnar 20 for now. May reconsider at later time Pap smear due Dec 2024 - will have her schedule with me.       Human immunodeficiency virus (HIV) disease ([place]) (Chronic)    Very well controlled on once daily Biktarvy. No concerns with access or adherence to medication. They are tolerating the medication well without side effects. Counseled to avoid Ibuprofen due to HTN, xaralto use and TAF ingredient in [place]. She will use Tylenol instead.  No changes to insurance coverage.  No dental needs today.  No concern over  anxious/depressed mood.  Sexual health and family planning discussed - no needs today.  Vaccines updated today - see health maintenance section.   Return in about 11 months (around 02/07/2023) for follow up.        Relevant Medications   bictegravir-emtricitabine-tenofovir AF (BIKTARVY) 50-200-25 MG TABS tablet   Other Relevant Orders   HIV 1 RNA quant-no reflex-bld   T-helper cells (CD4) count   COMPLETE METABOLIC PANEL WITH GFR   CBC   Lipid panel   Janene Madeira, MSN, NP-C [facility] [place] Pager:  [phone] Office: [phone]  03/16/22  10:36 AM

## 2022-03-16 NOTE — Assessment & Plan Note (Addendum)
I gave her Dr. Thompson Caul office number and asked her to please follow up with Xarelto assitance with their office so she can continue to get this readily. She will call this week.

## 2022-03-18 LAB — CBC
HCT: 35.4 % (ref 35.0–45.0)
Hemoglobin: 11.9 g/dL (ref 11.7–15.5)
MCH: 30.9 pg (ref 27.0–33.0)
MCHC: 33.6 g/dL (ref 32.0–36.0)
MCV: 91.9 fL (ref 80.0–100.0)
MPV: 10.1 fL (ref 7.5–12.5)
Platelets: 230 10*3/uL (ref 140–400)
RBC: 3.85 10*6/uL (ref 3.80–5.10)
RDW: 12.4 % (ref 11.0–15.0)
WBC: 3.2 10*3/uL — ABNORMAL LOW (ref 3.8–10.8)

## 2022-03-18 LAB — HELPER T-LYMPH-CD4 (ARMC ONLY)
% CD 4 Pos. Lymph.: 43.8 % (ref 30.8–58.5)
Absolute CD 4 Helper: 613 /uL (ref 359–1519)
Basophils Absolute: 0 10*3/uL (ref 0.0–0.2)
Basos: 1 %
EOS (ABSOLUTE): 0.1 10*3/uL (ref 0.0–0.4)
Eos: 2 %
Hematocrit: 38.3 % (ref 34.0–46.6)
Hemoglobin: 12.3 g/dL (ref 11.1–15.9)
Immature Grans (Abs): 0 10*3/uL (ref 0.0–0.1)
Immature Granulocytes: 0 %
Lymphocytes Absolute: 1.4 10*3/uL (ref 0.7–3.1)
Lymphs: 45 %
MCH: 31.6 pg (ref 26.6–33.0)
MCHC: 32.1 g/dL (ref 31.5–35.7)
MCV: 99 fL — ABNORMAL HIGH (ref 79–97)
Monocytes Absolute: 0.2 10*3/uL (ref 0.1–0.9)
Monocytes: 6 %
Neutrophils Absolute: 1.4 10*3/uL (ref 1.4–7.0)
Neutrophils: 46 %
Platelets: 243 10*3/uL (ref 150–450)
RBC: 3.89 x10E6/uL (ref 3.77–5.28)
RDW: 12.4 % (ref 11.7–15.4)
WBC: 3.1 10*3/uL — ABNORMAL LOW (ref 3.4–10.8)

## 2022-03-18 LAB — COMPLETE METABOLIC PANEL WITH GFR
AG Ratio: 1.1 (calc) (ref 1.0–2.5)
ALT: 10 U/L (ref 6–29)
AST: 21 U/L (ref 10–35)
Albumin: 3.8 g/dL (ref 3.6–5.1)
Alkaline phosphatase (APISO): 71 U/L (ref 37–153)
BUN/Creatinine Ratio: 13 (calc) (ref 6–22)
BUN: 15 mg/dL (ref 7–25)
CO2: 25 mmol/L (ref 20–32)
Calcium: 9 mg/dL (ref 8.6–10.4)
Chloride: 108 mmol/L (ref 98–110)
Creat: 1.12 mg/dL — ABNORMAL HIGH (ref 0.50–1.05)
Globulin: 3.4 g/dL (calc) (ref 1.9–3.7)
Glucose, Bld: 103 mg/dL — ABNORMAL HIGH (ref 65–99)
Potassium: 4.3 mmol/L (ref 3.5–5.3)
Sodium: 140 mmol/L (ref 135–146)
Total Bilirubin: 0.4 mg/dL (ref 0.2–1.2)
Total Protein: 7.2 g/dL (ref 6.1–8.1)
eGFR: 55 mL/min/{1.73_m2} — ABNORMAL LOW (ref >=60)

## 2022-03-18 LAB — LIPID PANEL
Cholesterol: 206 mg/dL — ABNORMAL HIGH (ref <200)
HDL: 71 mg/dL (ref >=50)
LDL Cholesterol (Calc): 119 mg/dL (calc) — ABNORMAL HIGH
Non-HDL Cholesterol (Calc): 135 mg/dL (calc) — ABNORMAL HIGH (ref <130)
Total CHOL/HDL Ratio: 2.9 (calc) (ref <5.0)
Triglycerides: 66 mg/dL (ref <150)

## 2022-03-18 LAB — HIV-1 RNA QUANT-NO REFLEX-BLD
HIV 1 RNA Quant: NOT DETECTED Copies/mL
HIV-1 RNA Quant, Log: NOT DETECTED Log cps/mL

## 2022-04-03 ENCOUNTER — Telehealth: Payer: Self-pay | Admitting: Interventional Cardiology

## 2022-04-03 DIAGNOSIS — I4811 Longstanding persistent atrial fibrillation: Secondary | ICD-10-CM

## 2022-04-03 MED ORDER — RIVAROXABAN 20 MG PO TABS: 20.0000 mg | ORAL_TABLET | Freq: Every day | ORAL | 0 refills | Status: DC

## 2022-04-03 NOTE — Telephone Encounter (Signed)
*  STAT* If patient is at the pharmacy, call can be transferred to refill team.   1. Which medications need to be refilled? (please list name of each medication and dose if known)  rivaroxaban (XARELTO) 20 MG TABS tablet  2. Which pharmacy/location (including street and city if local pharmacy) is medication to be sent to? [place], [place]    3. Do they need a 30 day or 90 day supply?  30 day supply

## 2022-04-03 NOTE — Telephone Encounter (Signed)
Prescription refill request for Xarelto received.  Indication: a fib Last office visit: 07/03/21 Weight: 174  Age: 62  Scr: 1.12 CrCl:  64 mL/min

## 2022-04-04 ENCOUNTER — Other Ambulatory Visit: Payer: Self-pay | Admitting: Infectious Diseases

## 2022-04-04 DIAGNOSIS — B20 Human immunodeficiency virus [HIV] disease: Secondary | ICD-10-CM

## 2022-04-08 ENCOUNTER — Telehealth: Payer: Self-pay

## 2022-04-08 NOTE — Telephone Encounter (Signed)
Called pt back and left message informing pt that her Rx was sent to her pharmacy as requested, with information for pt to get a discount for her medication Xarelto and if she has any other problems, questions or concerns, to give our office a call back.

## 2022-04-09 ENCOUNTER — Telehealth (HOSPITAL_COMMUNITY): Payer: Self-pay | Admitting: Emergency Medicine

## 2022-04-09 ENCOUNTER — Other Ambulatory Visit: Payer: Self-pay

## 2022-04-09 ENCOUNTER — Encounter (HOSPITAL_COMMUNITY): Payer: Self-pay | Admitting: Emergency Medicine

## 2022-04-09 ENCOUNTER — Ambulatory Visit (HOSPITAL_COMMUNITY)
Admission: EM | Admit: 2022-04-09 | Discharge: 2022-04-09 | Disposition: A | Discharge status: Home / Self Care | Payer: 59 | Attending: Internal Medicine | Admitting: Internal Medicine

## 2022-04-09 DIAGNOSIS — R1030 Lower abdominal pain, unspecified: Secondary | ICD-10-CM | POA: Diagnosis not present

## 2022-04-09 DIAGNOSIS — N390 Urinary tract infection, site not specified: Secondary | ICD-10-CM

## 2022-04-09 DIAGNOSIS — A09 Infectious gastroenteritis and colitis, unspecified: Secondary | ICD-10-CM

## 2022-04-09 DIAGNOSIS — R110 Nausea: Secondary | ICD-10-CM

## 2022-04-09 LAB — POCT URINALYSIS DIPSTICK, ED / UC
Bilirubin Urine: NEGATIVE
Glucose, UA: NEGATIVE mg/dL
Ketones, ur: NEGATIVE mg/dL
Nitrite: NEGATIVE
Protein, ur: NEGATIVE mg/dL
Specific Gravity, Urine: 1.025 (ref 1.005–1.030)
Urobilinogen, UA: 0.2 mg/dL (ref 0.0–1.0)
pH: 5 (ref 5.0–8.0)

## 2022-04-09 MED ORDER — CEPHALEXIN 500 MG PO CAPS: 500.0000 mg | ORAL_CAPSULE | Freq: Four times a day (QID) | ORAL | 0 refills | Status: DC

## 2022-04-09 MED ORDER — ONDANSETRON HCL 4 MG PO TABS: 4.0000 mg | ORAL_TABLET | Freq: Four times a day (QID) | ORAL | 0 refills | Status: DC

## 2022-04-09 MED ORDER — ONDANSETRON 4 MG PO TBDP
4.0000 mg | ORAL_TABLET | Freq: Once | ORAL | Status: AC
  Administered 2022-04-09: 4 mg via ORAL

## 2022-04-09 MED ORDER — ONDANSETRON 4 MG PO TBDP
ORAL_TABLET | ORAL | Status: AC
  Filled 2022-04-09: qty 1

## 2022-04-09 NOTE — Discharge Instructions (Addendum)
Your labs showed a possible urinary tract infection we will start her on antibiotics today.  We will send your urine off for a culture which will return in 3 days You will need to follow-up with your PCP to have your urine reevaluated after taking the full dose of antibiotics If symptoms become worse you will need to be seen in the emergency room. Make sure you take the full dose of antibiotics with food Take nausea medicine as needed Final Clinical Impressions(s) / UC Diagnoses

## 2022-04-09 NOTE — ED Triage Notes (Signed)
Reports abdominal pain started last night.  Has nausea, no vomiting.  Patient has had diarrhea.  3 episodes of diarrhea today.    Has not had any medications

## 2022-04-09 NOTE — ED Notes (Signed)
Urine specimen in lab 

## 2022-04-09 NOTE — ED Provider Notes (Signed)
[place]    CSN: [id number] Arrival date & time: 04/09/22  2505      History   Chief Complaint Chief Complaint  Patient presents with   Abdominal Pain    HPI Vicki Brown is a 62 y.o. female.   Patient presents today with lower abdominal pain CVA tenderness low-grade fever and nausea for the past 3 days.  Has had some slight diarrhea.  Patient states that she has had UTIs in the past.  Has not taken anything prior to arrival denies any chest pain no shortness of breath.    Past Medical History:  Diagnosis Date   Allergy    HIV infection ([place])    Hyperlipidemia    Hypertension    Insomnia 12/19/2014    Patient Active Problem List   Diagnosis Date Noted   Hearing loss 12/15/2021   Left arm pain 02/17/2021   Iron deficiency anemia 08/08/2020   Healthcare maintenance 08/07/2020   Fatigue 07/12/2020   Screening for cervical cancer 02/21/2020   Multinodular goiter 06/01/2019   Elevated liver function tests 04/22/2018   Laryngopharyngeal reflux (LPR) 02/24/2018   Hyperthyroidism 01/27/2018   Hoarseness 01/27/2018   Atrial fibrillation ([place]) 10/21/2017   Hyperlipidemia 10/21/2017   Insomnia 12/19/2014   Perennial allergic rhinitis 12/28/2012   Dyslipidemia 07/31/2010   Human immunodeficiency virus (HIV) disease ([place]) 01/19/2008   PERIPHERAL NEUROPATHY 01/19/2008   Essential hypertension 01/19/2008    Past Surgical History:  Procedure Laterality Date   Broken Arm Left    Pins placed in arm   TUBAL LIGATION      OB History     Gravida  2   Para      Term      Preterm      AB      Living  2      SAB      IAB      Ectopic      Multiple      Live Births               Home Medications    Prior to Admission medications   Medication Sig Start Date End Date Taking? Authorizing Provider  cephALEXin (KEFLEX) 500 MG capsule Take 1 capsule (500 mg total) by mouth 4 (four) times daily. 04/09/22  Yes Marney Setting, NP   ondansetron (ZOFRAN) 4 MG tablet Take 1 tablet (4 mg total) by mouth every 6 (six) hours. 04/09/22  Yes Marney Setting, NP  atorvastatin (LIPITOR) 10 MG tablet Take 4 tablets (40 mg total) by mouth daily. 03/16/22   [place] Callas, NP  bictegravir-emtricitabine-tenofovir AF (BIKTARVY) 50-200-25 MG TABS tablet Take 1 tablet by mouth daily. 03/16/22   [place] Callas, NP  lisinopril-hydrochlorothiazide (ZESTORETIC) 10-12.5 MG tablet Take 1 tablet by mouth daily. 09/17/21   Sanjuan Dame, MD  metoprolol succinate (TOPROL XL) 25 MG 24 hr tablet Take 1 tablet (25 mg total) by mouth daily. Patient not taking: Reported on 03/16/2022 06/21/19   Belva Crome, MD  omeprazole (PRILOSEC) 20 MG capsule Take 1 capsule (20 mg total) by mouth 2 (two) times daily before a meal for 28 days, THEN 1 capsule (20 mg total) daily for 28 days. 07/12/20 09/06/20  [place] Callas, NP  rivaroxaban (XARELTO) 20 MG TABS tablet Take 1 tablet (20 mg total) by mouth daily with supper. 04/03/22   End, Harrell Gave, MD  traZODone (DESYREL) 50 MG tablet Take 0.5 tablets (25 mg total)  by mouth at bedtime. 10/24/19   Campbell Riches, MD  Vitamin D, Ergocalciferol, 50000 units CAPS Take 1 capsule by mouth once a week. 07/22/20   Kenney Callas, NP    Family History Family History  Problem Relation Age of Onset   Cancer Mother        uterine vs ovarian?   Seizures Father    Mental illness Brother    Breast cancer Maternal Aunt    Diabetes Daughter    Colon polyps Neg Hx    Esophageal cancer Neg Hx    Rectal cancer Neg Hx    Stomach cancer Neg Hx    Thyroid disease Neg Hx     Social History Social History   Tobacco Use   Smoking status: Former    Packs/day: 2.00    Years: 20.00    Total pack years: 40.00    Types: Cigarettes    Quit date: 2004    Years since quitting: 20.0   Smokeless tobacco: Never   Tobacco comments:    20 years ago  Vaping Use   Vaping Use: Never used  Substance Use Topics    Alcohol use: Not Currently    Alcohol/week: 6.0 standard drinks of alcohol    Types: 6 Cans of beer per week   Drug use: Yes    Frequency: 0.5 times per week    Types: Marijuana     Allergies   Patient has no known allergies.   Review of Systems Review of Systems  Constitutional:  Positive for fever.  Respiratory: Negative.    Cardiovascular: Negative.   Gastrointestinal:  Positive for abdominal pain, diarrhea and nausea. Negative for vomiting.  Genitourinary:  Positive for frequency.  Musculoskeletal: Negative.      Physical Exam Triage Vital Signs ED Triage Vitals  Enc Vitals Group     BP 04/09/22 1054 116/80     Pulse Rate 04/09/22 1054 100     Resp 04/09/22 1054 20     Temp 04/09/22 1054 100.1 F (37.8 C)     Temp Source 04/09/22 1054 Oral     SpO2 04/09/22 1054 95 %     Weight --      Height --      Head Circumference --      Peak Flow --      Pain Score 04/09/22 1052 8     Pain Loc --      Pain Edu? --      Excl. in [place]? --    No data found.  Updated Vital Signs BP 116/80 (BP Location: Right Arm)   Pulse 100   Temp 100.1 F (37.8 C) (Oral)   Resp 20   SpO2 95%   Visual Acuity Right Eye Distance:   Left Eye Distance:   Bilateral Distance:    Right Eye Near:   Left Eye Near:    Bilateral Near:     Physical Exam Constitutional:      Appearance: She is well-developed.  Cardiovascular:     Rate and Rhythm: Normal rate.  Pulmonary:     Effort: Pulmonary effort is normal.  Abdominal:     General: Abdomen is flat. Bowel sounds are normal.     Palpations: Abdomen is soft.     Tenderness: There is abdominal tenderness in the right lower quadrant and left lower quadrant. There is right CVA tenderness and left CVA tenderness. There is no guarding or rebound.  Skin:    General: Skin  is warm.  Neurological:     Mental Status: She is alert.      UC Treatments / Results  Labs (all labs ordered are listed, but only abnormal results are  displayed) Labs Reviewed  POCT URINALYSIS DIPSTICK, ED / UC - Abnormal; Notable for the following components:      Result Value   Hgb urine dipstick TRACE (*)    Leukocytes,Ua TRACE (*)    All other components within normal limits  URINE CULTURE    EKG   Radiology No results found.  Procedures Procedures (including critical care time)  Medications Ordered in UC Medications  ondansetron (ZOFRAN-ODT) disintegrating tablet 4 mg (4 mg Oral Given 04/09/22 1059)    Initial Impression / Assessment and Plan / UC Course  I have reviewed the triage vital signs and the nursing notes.  Pertinent labs & imaging results that were available during my care of the patient were reviewed by me and considered in my medical decision making (see chart for details).     Your labs showed a possible urinary tract infection we will start her on antibiotics today.  We will send your urine off for a culture which will return in 3 days You will need to follow-up with your PCP to have your urine reevaluated after taking the full dose of antibiotics If symptoms become worse you will need to be seen in the emergency room. Make sure you take the full dose of antibiotics with food Take nausea medicine as needed Final Clinical Impressions(s) / UC Diagnoses   Final diagnoses:  Lower urinary tract infectious disease  Lower abdominal pain  Nausea  Diarrhea of infectious origin     Discharge Instructions      Your labs showed a possible urinary tract infection we will start her on antibiotics today.  We will send your urine off for a culture which will return in 3 days You will need to follow-up with your PCP to have your urine reevaluated after taking the full dose of antibiotics If symptoms become worse you will need to be seen in the emergency room. Make sure you take the full dose of antibiotics with food Take nausea medicine as needed Final Clinical Impressions(s) / UC Diagnoses       ED  Prescriptions     Medication Sig Dispense Auth. Provider   ondansetron (ZOFRAN) 4 MG tablet Take 1 tablet (4 mg total) by mouth every 6 (six) hours. 12 tablet Morley Kos L, NP   cephALEXin (KEFLEX) 500 MG capsule Take 1 capsule (500 mg total) by mouth 4 (four) times daily. 20 capsule Marney Setting, NP      PDMP not reviewed this encounter.   Marney Setting, NP 04/09/22 1122

## 2022-04-10 ENCOUNTER — Telehealth: Payer: Self-pay

## 2022-04-10 ENCOUNTER — Telehealth: Payer: Self-pay | Admitting: Interventional Cardiology

## 2022-04-10 DIAGNOSIS — I4811 Longstanding persistent atrial fibrillation: Secondary | ICD-10-CM

## 2022-04-10 LAB — URINE CULTURE: Culture: NO GROWTH

## 2022-04-10 MED ORDER — RIVAROXABAN 20 MG PO TABS: 20.0000 mg | ORAL_TABLET | Freq: Every day | ORAL | 1 refills | Status: DC

## 2022-04-10 NOTE — Telephone Encounter (Signed)
*  STAT* If patient is at the pharmacy, call can be transferred to refill team.   1. Which medications need to be refilled? (please list name of each medication and dose if known)   rivaroxaban (XARELTO) 20 MG TABS tablet    2. Which pharmacy/location (including street and city if local pharmacy) is medication to be sent to? [id number]- [place], [place] - [address] [place] [address]  3. Do they need a 30 day or 90 day supply? 90 day  Refill was sent to wrong pharmacy on 04/01/24

## 2022-04-10 NOTE — Telephone Encounter (Signed)
Xarelto '20mg'$  refill request received. Pt is 62 years old, weight-78.9kg, Crea-1.12 on 03/16/22, last seen by Vin, PA on 07/03/2021, Diagnosis-Afib, CrCl-64.04 mL/min; Dose is appropriate based on dosing criteria. Will send in refill to requested pharmacy.     Previous refill on 04/03/22 was sent to [facility] and [facility].

## 2022-04-10 NOTE — Telephone Encounter (Signed)
Done

## 2022-04-16 ENCOUNTER — Telehealth: Payer: Self-pay | Admitting: Interventional Cardiology

## 2022-04-16 NOTE — Telephone Encounter (Signed)
Patient states she needs financial assistance with the co-pay for Xarelto. Prescription is ready for pick up at the pharmacy on file. Pt. Stated she will pick up the Xarelto savings program card from the front desk on 04/17/22. Patient voiced understanding.

## 2022-04-16 NOTE — Telephone Encounter (Signed)
Pt c/o medication issue:  1. Name of Medication:   rivaroxaban (XARELTO) 20 MG TABS tablet   2. How are you currently taking this medication (dosage and times per day)?   As prescribed  3. Are you having a reaction (difficulty breathing--STAT)?   No  4. What is your medication issue?   Patient stated she is out of this medication and this medication is too expensive.  Patient would like assistance to get this medication.

## 2022-05-11 ENCOUNTER — Ambulatory Visit (INDEPENDENT_AMBULATORY_CARE_PROVIDER_SITE_OTHER): Payer: 59 | Admitting: Family

## 2022-05-11 ENCOUNTER — Encounter: Payer: Self-pay | Admitting: Family

## 2022-05-11 ENCOUNTER — Other Ambulatory Visit: Payer: Self-pay

## 2022-05-11 VITALS — BP 113/77 | HR 67 | Ht 66.0 in | Wt 176.8 lb

## 2022-05-11 DIAGNOSIS — R1030 Lower abdominal pain, unspecified: Secondary | ICD-10-CM | POA: Diagnosis not present

## 2022-05-11 NOTE — Assessment & Plan Note (Signed)
Vicki Brown as lower abdominal pain that appears consistent with constipation. Previous symptoms unlikely UTI and suspect she may have had a viral illness but is improved. Encouraged to increase water and use OTC medications as needed for constipation with information provided in AVS. Recommend follow up if symptoms worsen or do not improve.

## 2022-05-11 NOTE — Progress Notes (Signed)
Patient ID: Vicki Brown, female    DOB: 1960-05-24, 62 y.o.   MRN: [id number]    Subjective:    No chief complaint on file.   HPI:  Vicki Brown is a 62 y.o. female with well controlled HIV disease presenting today for an acute office visit.   Vicki Brown was seen at Urgent Care on 04/09/22 with lower abdominal pain,CVA tenderness and low grade fever and nausea for 3 days. Diagnosed with probable UTI and treated with 3 days of Keflex. Urine cultures were without growth.   Vicki Brown completed Vicki Brown course of Keflex and continues to have some lower abdominal pain described as achy at times. Denies any current urinary symptoms including dysuria, frequency, urgency, or CVA tenderness. Feels like she does urinate a little more frequently than previous. Has been constipated and has small, hard bowel movements. Does not consume a significant amount of water but drinks a lot of juice and soda since she stopped drinking alcohol. Denies changes in vision, numbness or tingling, excessive hunger, or excessive thirst.    No Known Allergies    Outpatient Medications Prior to Visit  Medication Sig Dispense Refill   atorvastatin (LIPITOR) 10 MG tablet Take 4 tablets (40 mg total) by mouth daily. 30 tablet 11   bictegravir-emtricitabine-tenofovir AF (BIKTARVY) 50-200-25 MG TABS tablet Take 1 tablet by mouth daily. 30 tablet 11   lisinopril-hydrochlorothiazide (ZESTORETIC) 10-12.5 MG tablet Take 1 tablet by mouth daily. 90 tablet 3   metoprolol succinate (TOPROL XL) 25 MG 24 hr tablet Take 1 tablet (25 mg total) by mouth daily. (Patient not taking: Reported on 03/16/2022) 90 tablet 3   omeprazole (PRILOSEC) 20 MG capsule Take 1 capsule (20 mg total) by mouth 2 (two) times daily before a meal for 28 days, THEN 1 capsule (20 mg total) daily for 28 days. 84 capsule 0   ondansetron (ZOFRAN) 4 MG tablet Take 1 tablet (4 mg total) by mouth every 6 (six) hours. 12 tablet 0   rivaroxaban (XARELTO) 20 MG TABS tablet  Take 1 tablet (20 mg total) by mouth daily with supper. 90 tablet 1   traZODone (DESYREL) 50 MG tablet Take 0.5 tablets (25 mg total) by mouth at bedtime. 30 tablet 3   Vitamin D, Ergocalciferol, 50000 units CAPS Take 1 capsule by mouth once a week. 8 capsule 0   cephALEXin (KEFLEX) 500 MG capsule Take 1 capsule (500 mg total) by mouth 4 (four) times daily. 20 capsule 0   No facility-administered medications prior to visit.     Past Medical History:  Diagnosis Date   Allergy    HIV infection ([place])    Hyperlipidemia    Hypertension    Insomnia 12/19/2014     Past Surgical History:  Procedure Laterality Date   Broken Arm Left    Pins placed in arm   TUBAL LIGATION        Review of Systems  Constitutional:  Negative for chills, diaphoresis, fatigue and fever.  Respiratory:  Negative for cough, chest tightness, shortness of breath and wheezing.   Cardiovascular:  Negative for chest pain.  Gastrointestinal:  Positive for abdominal pain and constipation. Negative for diarrhea, nausea and vomiting.      Objective:    BP 113/77   Pulse 67   Ht '5\' 6"'$  (1.676 m)   Wt 176 lb 12.8 oz (80.2 kg)   BMI 28.54 kg/m  Nursing note and vital signs reviewed.  Physical Exam Constitutional:  General: She is not in acute distress.    Appearance: She is well-developed.  Cardiovascular:     Rate and Rhythm: Normal rate and regular rhythm.     Heart sounds: Normal heart sounds.  Pulmonary:     Effort: Pulmonary effort is normal.     Breath sounds: Normal breath sounds.  Abdominal:     General: Abdomen is flat. There is no distension.     Palpations: There is no mass.     Tenderness: There is abdominal tenderness (across lower abdomen). There is no right CVA tenderness, left CVA tenderness, guarding or rebound.  Skin:    General: Skin is warm and dry.  Neurological:     Mental Status: She is alert.  Psychiatric:        Mood and Affect: Mood normal.         05/11/2022     2:49 PM 12/12/2021    9:55 AM 06/05/2021    3:06 PM 08/08/2020    4:14 PM 07/12/2020   11:38 AM  Depression screen PHQ 2/9  Decreased Interest 0 0 0 0 0  Down, Depressed, Hopeless 0 0 0 0 0  PHQ - 2 Score 0 0 0 0 0  Altered sleeping    1   Tired, decreased energy    1   Change in appetite    1   Feeling bad or failure about yourself     0   Trouble concentrating    0   Moving slowly or fidgety/restless    0   Suicidal thoughts    0   PHQ-9 Score    3   Difficult doing work/chores    Not difficult at all        Assessment & Plan:    Patient Active Problem List   Diagnosis Date Noted   Hearing loss 12/15/2021   Left arm pain 02/17/2021   Iron deficiency anemia 08/08/2020   Healthcare maintenance 08/07/2020   Fatigue 07/12/2020   Screening for cervical cancer 02/21/2020   Multinodular goiter 06/01/2019   Elevated liver function tests 04/22/2018   Laryngopharyngeal reflux (LPR) 02/24/2018   Hyperthyroidism 01/27/2018   Hoarseness 01/27/2018   Atrial fibrillation ([place]) 10/21/2017   Hyperlipidemia 10/21/2017   Insomnia 12/19/2014   Perennial allergic rhinitis 12/28/2012   Dyslipidemia 07/31/2010   Lower abdominal pain 07/02/2008   Human immunodeficiency virus (HIV) disease ([place]) 01/19/2008   PERIPHERAL NEUROPATHY 01/19/2008   Essential hypertension 01/19/2008     Problem List Items Addressed This Visit       Other   Lower abdominal pain - Primary    Vicki Brown as lower abdominal pain that appears consistent with constipation. Previous symptoms unlikely UTI and suspect she may have had a viral illness but is improved. Encouraged to increase water and use OTC medications as needed for constipation with information provided in AVS. Recommend follow up if symptoms worsen or do not improve.         I have discontinued Vicki Brown's cephALEXin. I am also having Vicki Brown maintain Vicki Brown metoprolol succinate, traZODone, omeprazole, Vitamin D (Ergocalciferol),  lisinopril-hydrochlorothiazide, Biktarvy, atorvastatin, ondansetron, and rivaroxaban.   Follow-up: For routine follow up or if symptoms worsen or do not improve.    Terri Piedra, MSN, FNP-C Nurse Practitioner [facility] [place] number: [phone]

## 2022-05-11 NOTE — Patient Instructions (Signed)
Nice to see you.   Continue to take your medication daily as prescribed.  Recommend stool softener (Colace), Miralax or Magnesium Citrate. Warm prune juice is also a good stimulant.  Drink plenty of water.  Follow up if symptoms worsen or do not improve.   Have a great day and stay safe!    Constipation, Adult Constipation is when a person has trouble pooping (having a bowel movement). When you have this condition, you may poop fewer than 3 times a week. Your poop (stool) may also be dry, hard, or bigger than normal. Follow these instructions at home: Eating and drinking  Eat foods that have a lot of fiber, such as: Fresh fruits and vegetables. Whole grains. Beans. Eat less of foods that are low in fiber and high in fat and sugar, such as: [place] fries. Hamburgers. Cookies. Candy. Soda. Drink enough fluid to keep your pee (urine) pale yellow. General instructions Exercise regularly or as told by your doctor. Try to do 150 minutes of exercise each week. Go to the restroom when you feel like you need to poop. Do not hold it in. Take over-the-counter and prescription medicines only as told by your doctor. These include any fiber supplements. When you poop: Do deep breathing while relaxing your lower belly (abdomen). Relax your pelvic floor. The pelvic floor is a group of muscles that support the rectum, bladder, and intestines (as well as the uterus in women). Watch your condition for any changes. Tell your doctor if you notice any. Keep all follow-up visits as told by your doctor. This is important. Contact a doctor if: You have pain that gets worse. You have a fever. You have not pooped for 4 days. You vomit. You are not hungry. You lose weight. You are bleeding from the opening of the butt (anus). You have thin, pencil-like poop. Get help right away if: You have a fever, and your symptoms suddenly get worse. You leak poop or have blood in your poop. Your belly  feels hard or bigger than normal (bloated). You have very bad belly pain. You feel dizzy or you faint. Summary Constipation is when a person poops fewer than 3 times a week, has trouble pooping, or has poop that is dry, hard, or bigger than normal. Eat foods that have a lot of fiber. Drink enough fluid to keep your pee (urine) pale yellow. Take over-the-counter and prescription medicines only as told by your doctor. These include any fiber supplements. This information is not intended to replace advice given to you by your health care provider. Make sure you discuss any questions you have with your health care provider. Document Revised: 01/07/2022 Document Reviewed: 01/07/2022 Elsevier Patient Education  [place].

## 2022-07-02 ENCOUNTER — Other Ambulatory Visit: Payer: Self-pay | Admitting: Student

## 2022-07-02 DIAGNOSIS — I10 Essential (primary) hypertension: Secondary | ICD-10-CM

## 2022-07-09 ENCOUNTER — Encounter: Payer: Self-pay | Admitting: Nurse Practitioner

## 2022-07-09 ENCOUNTER — Ambulatory Visit: Payer: 59 | Attending: Nurse Practitioner | Admitting: Nurse Practitioner

## 2022-07-09 VITALS — BP 128/76 | HR 76 | Ht 66.0 in | Wt 178.0 lb

## 2022-07-09 DIAGNOSIS — E785 Hyperlipidemia, unspecified: Secondary | ICD-10-CM | POA: Diagnosis not present

## 2022-07-09 DIAGNOSIS — I480 Paroxysmal atrial fibrillation: Secondary | ICD-10-CM

## 2022-07-09 DIAGNOSIS — I10 Essential (primary) hypertension: Secondary | ICD-10-CM

## 2022-07-09 NOTE — Patient Instructions (Signed)
Medication Instructions:  Hold Atorvastatin for 2 weeks. Call our office back with a report.   *If you need a refill on your cardiac medications before your next appointment, please call your pharmacy*   Lab Work: NONE ordered at this time of appointment   If you have labs (blood work) drawn today and your tests are completely normal, you will receive your results only by: MyChart Message (if you have MyChart) OR A paper copy in the mail If you have any lab test that is abnormal or we need to change your treatment, we will call you to review the results.   Testing/Procedures: NONE ordered at this time of appointment     Follow-Up: At [facility], you and your health needs are our priority.  As part of our continuing mission to provide you with exceptional heart care, we have created designated Provider Care Teams.  These Care Teams include your primary Cardiologist (physician) and Advanced Practice Providers (APPs -  Physician Assistants and Nurse Practitioners) who all work together to provide you with the care you need, when you need it.  We recommend signing up for the patient portal called "MyChart".  Sign up information is provided on this After Visit Summary.  MyChart is used to connect with patients for Virtual Visits (Telemedicine).  Patients are able to view lab/test results, encounter notes, upcoming appointments, etc.  Non-urgent messages can be sent to your provider as well.   To learn more about what you can do with MyChart, go to [email or web address].    Your next appointment:   2 month(s)  Provider:   Bernadene Person, NP        Other Instructions

## 2022-07-09 NOTE — Progress Notes (Signed)
Office Visit    Patient Name: Vicki Brown Date of Encounter: 07/09/2022  Primary Care Provider:  Evlyn Kanner, MD Primary Cardiologist:  Lesleigh Noe, MD (Inactive)  Chief Complaint    62 year old female with a history of paroxysmal atrial fibrillation, hypertension, hyperlipidemia, and HIV who presents for follow-up related to atrial fibrillation.  Past Medical History    Past Medical History:  Diagnosis Date   Allergy    HIV infection (HCC)    Hyperlipidemia    Hypertension    Insomnia 12/19/2014   Past Surgical History:  Procedure Laterality Date   Broken Arm Left    Pins placed in arm   TUBAL LIGATION      Allergies  No Known Allergies   Labs/Other Studies Reviewed    The following studies were reviewed today:  Echo 07/05/2019 1. Left ventricular ejection fraction, by estimation, is 60 to 65%. The  left ventricle has normal function. The left ventricle has no regional  wall motion abnormalities. Left ventricular diastolic parameters were  normal.   2. Right ventricular systolic function is normal. The right ventricular  size is normal. There is normal pulmonary artery systolic pressure.   3. The mitral valve is normal in structure. Trivial mitral valve  regurgitation. No evidence of mitral stenosis.   4. The aortic valve is tricuspid. Aortic valve regurgitation is trivial.  Mild aortic valve sclerosis is present, with no evidence of aortic valve  stenosis.   5. The inferior vena cava is normal in size with greater than 50%  respiratory variability, suggesting right atrial pressure of 3 mmHg.    Stress test 10/2017 Nuclear stress EF: 72%. The left ventricular ejection fraction is hyperdynamic (>65%). There was no ST segment deviation noted during stress. The study is normal. This is a low risk study.   Normal resting and stress perfusion. No ischemia or infarction EF 72%  Recent Labs: 03/16/2022: ALT 10; BUN 15; Creat 1.12; Hemoglobin  11.9; Platelets 230; Potassium 4.3; Sodium 140  Recent Lipid Panel    Component Value Date/Time   CHOL 206 (H) 03/16/2022 1009   TRIG 66 03/16/2022 1009   HDL 71 03/16/2022 1009   CHOLHDL 2.9 03/16/2022 1009   VLDL 13 04/22/2016 1344   LDLCALC 119 (H) 03/16/2022 1009    History of Present Illness    62 year old female with the above past medical history including paroxysmal atrial fibrillation, hypertension, hyperlipidemia, and HIV.  Stress test in 2019 was low risk.  Echocardiogram in 2021 showed EF 60 to 65%, normal LV function, no RWMA.  She has a history of atrial fibrillation on warfarin due to cost.  She was last seen in the office on 07/03/2021 and was stable from a cardiac standpoint.  She was maintaining sinus rhythm at a time.  BP was well-controlled.  She presents today for follow-up.  Since her last visit she has been stable from a cardiac standpoint.  She does note weakness in her legs, myalgias.  She denies chest pain, dyspnea, palpitations, dizziness, edema, PND, orthopnea, weight gain.  She was a patient of Dr. Katrinka Blazing, she notes she has not been reassigned to cardiologist since his retirement.  Other than her myalgias, weakness in her legs, she reports feeling well.  Home Medications    Current Outpatient Medications  Medication Sig Dispense Refill   atorvastatin (LIPITOR) 10 MG tablet Take 4 tablets (40 mg total) by mouth daily. 30 tablet 11   bictegravir-emtricitabine-tenofovir AF (BIKTARVY) 50-200-25 MG TABS  tablet Take 1 tablet by mouth daily. 30 tablet 11   lisinopril-hydrochlorothiazide (ZESTORETIC) 10-12.5 MG tablet Take 1 tablet by mouth daily. 90 tablet 3   metoprolol succinate (TOPROL XL) 25 MG 24 hr tablet Take 1 tablet (25 mg total) by mouth daily. 90 tablet 3   omeprazole (PRILOSEC) 20 MG capsule Take 1 capsule (20 mg total) by mouth 2 (two) times daily before a meal for 28 days, THEN 1 capsule (20 mg total) daily for 28 days. 84 capsule 0   rivaroxaban  (XARELTO) 20 MG TABS tablet Take 1 tablet (20 mg total) by mouth daily with supper. 90 tablet 1   traZODone (DESYREL) 50 MG tablet Take 0.5 tablets (25 mg total) by mouth at bedtime. 30 tablet 3   Vitamin D, Ergocalciferol, 50000 units CAPS Take 1 capsule by mouth once a week. 8 capsule 0   No current facility-administered medications for this visit.     Review of Systems    She denies chest pain, palpitations, dyspnea, pnd, orthopnea, n, v, dizziness, syncope, edema, weight gain, or early satiety. All other systems reviewed and are otherwise negative except as noted above.   Physical Exam    VS:  BP 128/76   Pulse 76   Ht 5\' 6"  (1.676 m)   Wt 178 lb (80.7 kg)   BMI 28.73 kg/m   GEN: Well nourished, well developed, in no acute distress. HEENT: normal. Neck: Supple, no JVD, carotid bruits, or masses. Cardiac: RRR, no murmurs, rubs, or gallops. No clubbing, cyanosis, edema.  Radials/DP/PT 2+ and equal bilaterally.  Respiratory:  Respirations regular and unlabored, clear to auscultation bilaterally. GI: Soft, nontender, nondistended, BS + x 4. MS: no deformity or atrophy. Skin: warm and dry, no rash. Neuro:  Strength and sensation are intact. Psych: Normal affect.  Accessory Clinical Findings    ECG personally reviewed by me today -NSR, 76 bpm- no acute changes.   Lab Results  Component Value Date   WBC 3.2 (L) 03/16/2022   HGB 11.9 03/16/2022   HCT 35.4 03/16/2022   MCV 91.9 03/16/2022   PLT 230 03/16/2022   Lab Results  Component Value Date   CREATININE 1.12 (H) 03/16/2022   BUN 15 03/16/2022   NA 140 03/16/2022   K 4.3 03/16/2022   CL 108 03/16/2022   CO2 25 03/16/2022   Lab Results  Component Value Date   ALT 10 03/16/2022   AST 21 03/16/2022   ALKPHOS 94 06/05/2021   BILITOT 0.4 03/16/2022   Lab Results  Component Value Date   CHOL 206 (H) 03/16/2022   HDL 71 03/16/2022   LDLCALC 119 (H) 03/16/2022   TRIG 66 03/16/2022   CHOLHDL 2.9 03/16/2022     Lab Results  Component Value Date   HGBA1C 5.3 05/19/2019    Assessment & Plan    1. Paroxysmal atrial fibrillation: Maintaining sinus rhythm.  Continue metoprolol, Xarelto.  2. Hypertension: BP well controlled. Continue current antihypertensive regimen.   3. Hyperlipidemia: LDL was 119 in 03/2022.  She notes myalgias, weakness in her legs.  Will trial 2-week statin holiday to see if her symptoms improve.  If they do, consider transitioning from Lipitor to Crestor.  4. Disposition: Follow-up in 2 months.  I advised her to contact our [facility] office to discuss reassignment to a new cardiologist (patient of Dr. Katrinka Blazing prior to his retirement).      Joylene Grapes, NP 07/09/2022, 10:56 AM

## 2022-07-16 ENCOUNTER — Other Ambulatory Visit: Payer: 59 | Admitting: Family

## 2022-07-16 ENCOUNTER — Other Ambulatory Visit: Payer: Self-pay

## 2022-07-16 DIAGNOSIS — I10 Essential (primary) hypertension: Secondary | ICD-10-CM

## 2022-07-16 DIAGNOSIS — B20 Human immunodeficiency virus [HIV] disease: Secondary | ICD-10-CM

## 2022-07-16 MED ORDER — LISINOPRIL-HYDROCHLOROTHIAZIDE 10-12.5 MG PO TABS: 1.0000 | ORAL_TABLET | Freq: Every day | ORAL | 3 refills | Status: AC

## 2022-07-16 MED ORDER — BIKTARVY 50-200-25 MG PO TABS: 1.0000 | ORAL_TABLET | Freq: Every day | ORAL | 5 refills | Status: DC

## 2022-07-20 ENCOUNTER — Telehealth: Payer: Self-pay

## 2022-07-20 ENCOUNTER — Other Ambulatory Visit (HOSPITAL_COMMUNITY): Payer: Self-pay

## 2022-07-20 NOTE — Telephone Encounter (Signed)
[facility] Patient Advocate Encounter   Was successful in obtaining a [employer] copay card for Biktarvy.  This copay card will make the patients copay $0.00.  I have spoken with the patient.         Jayce Kainz, CPhT [facility] Patient Advocate [facility] Phone: [phone] Fax:  [phone]  

## 2022-07-21 ENCOUNTER — Telehealth: Payer: Self-pay

## 2022-07-21 NOTE — Telephone Encounter (Signed)
Advised pt to send or bring in pay stubs to apply for ICAP.  She said that she would bring them in. Also gave her [employer] copay card information.

## 2022-07-23 ENCOUNTER — Other Ambulatory Visit (HOSPITAL_COMMUNITY): Payer: Self-pay

## 2022-07-29 ENCOUNTER — Other Ambulatory Visit: Payer: Self-pay | Admitting: Pharmacist

## 2022-07-29 DIAGNOSIS — B20 Human immunodeficiency virus [HIV] disease: Secondary | ICD-10-CM

## 2022-07-29 MED ORDER — BICTEGRAVIR-EMTRICITAB-TENOFOV 50-200-25 MG PO TABS: 1.0000 | ORAL_TABLET | Freq: Every day | ORAL | 0 refills | Status: AC

## 2022-07-29 NOTE — Progress Notes (Signed)
Medication Samples have been provided to the patient.  Drug name: Biktarvy        Strength: 50/200/25 mg       Qty: 14 tablets (2 bottles) LOT: [id number]   Exp.Date: 8/26  Dosing instructions: Take one tablet by mouth once daily  The patient has been instructed regarding the correct time, dose, and frequency of taking this medication, including desired effects and most common side effects.   Quandarius Nill, PharmD, CPP, BCIDP, AAHIVP Clinical Pharmacist Practitioner Infectious Diseases Clinical Pharmacist [facility]  

## 2022-08-11 ENCOUNTER — Encounter: Payer: Self-pay | Admitting: *Deleted

## 2022-09-14 ENCOUNTER — Ambulatory Visit: Payer: 59 | Attending: Nurse Practitioner | Admitting: Nurse Practitioner

## 2022-09-14 NOTE — Progress Notes (Deleted)
Office Visit    Patient Name: Vicki Brown Date of Encounter: 09/14/2022  Primary Care Provider:  Philomena Doheny, MD Primary Cardiologist:  Lesleigh Noe, MD (Inactive)  Chief Complaint    62 year old female with a history of paroxysmal atrial fibrillation, hypertension, hyperlipidemia, and HIV who presents for follow-up related to atrial fibrillation.   Past Medical History    Past Medical History:  Diagnosis Date   Allergy    HIV infection (HCC)    Hyperlipidemia    Hypertension    Insomnia 12/19/2014   Past Surgical History:  Procedure Laterality Date   Broken Arm Left    Pins placed in arm   TUBAL LIGATION      Allergies  No Known Allergies   Labs/Other Studies Reviewed    The following studies were reviewed today: *** Cardiac Studies & Procedures     STRESS TESTS  MYOCARDIAL PERFUSION IMAGING 10/29/2017  Narrative  Nuclear stress EF: 72%.  The left ventricular ejection fraction is hyperdynamic (>65%).  There was no ST segment deviation noted during stress.  The study is normal.  This is a low risk study.  Normal resting and stress perfusion. No ischemia or infarction EF 72%   ECHOCARDIOGRAM  ECHOCARDIOGRAM COMPLETE 07/05/2019  Narrative ECHOCARDIOGRAM REPORT    Patient Name:   Vicki Brown Date of Exam: 07/05/2019 Medical Rec #:  [id number]      Height:       67.0 in Accession #:    [id number]     Weight:       170.4 lb Date of Birth:  11-13-1960      BSA:          1.889 m Patient Age:    58 years       BP:           128/82 mmHg Patient Gender: F              HR:           61 bpm. Exam Location:  [address]  Procedure: 2D Echo, 3D Echo, Cardiac Doppler and Color Doppler  Indications:    I48.91 Atrial Fibrillation  History:        Patient has prior history of Echocardiogram examinations, most recent 10/29/2017. CHF, Arrythmias:Atrial Fibrillation, Signs/Symptoms:Shortness of Breath; Risk Factors:Former  Smoker, Hypertension and Dyslipidemia. HIV.  Sonographer:    Farrel Conners RDCS Referring Phys: [phone] Barry Dienes [facility]  IMPRESSIONS   1. Left ventricular ejection fraction, by estimation, is 60 to 65%. The left ventricle has normal function. The left ventricle has no regional wall motion abnormalities. Left ventricular diastolic parameters were normal. 2. Right ventricular systolic function is normal. The right ventricular size is normal. There is normal pulmonary artery systolic pressure. 3. The mitral valve is normal in structure. Trivial mitral valve regurgitation. No evidence of mitral stenosis. 4. The aortic valve is tricuspid. Aortic valve regurgitation is trivial. Mild aortic valve sclerosis is present, with no evidence of aortic valve stenosis. 5. The inferior vena cava is normal in size with greater than 50% respiratory variability, suggesting right atrial pressure of 3 mmHg.  FINDINGS Left Ventricle: Left ventricular ejection fraction, by estimation, is 60 to 65%. The left ventricle has normal function. The left ventricle has no regional wall motion abnormalities. The left ventricular internal cavity size was normal in size. There is no left ventricular hypertrophy. Left ventricular diastolic parameters were normal.  Right Ventricle: The right ventricular size is  normal. No increase in right ventricular wall thickness. Right ventricular systolic function is normal. There is normal pulmonary artery systolic pressure. The tricuspid regurgitant velocity is 2.21 m/s, and with an assumed right atrial pressure of 10 mmHg, the estimated right ventricular systolic pressure is 29.5 mmHg.  Left Atrium: Left atrial size was normal in size.  Right Atrium: Right atrial size was normal in size.  Pericardium: There is no evidence of pericardial effusion.  Mitral Valve: The mitral valve is normal in structure. Normal mobility of the mitral valve leaflets. Trivial mitral valve regurgitation. No  evidence of mitral valve stenosis.  Tricuspid Valve: The tricuspid valve is normal in structure. Tricuspid valve regurgitation is trivial. No evidence of tricuspid stenosis.  Aortic Valve: The aortic valve is tricuspid. Aortic valve regurgitation is trivial. Mild aortic valve sclerosis is present, with no evidence of aortic valve stenosis.  Pulmonic Valve: The pulmonic valve was normal in structure. Pulmonic valve regurgitation is not visualized. No evidence of pulmonic stenosis.  Aorta: The aortic root is normal in size and structure.  Venous: The inferior vena cava is normal in size with greater than 50% respiratory variability, suggesting right atrial pressure of 3 mmHg.  IAS/Shunts: No atrial level shunt detected by color flow Doppler.   LEFT VENTRICLE PLAX 2D LVIDd:         3.72 cm  Diastology LVIDs:         2.69 cm  LV e' lateral:   9.03 cm/s LV PW:         1.15 cm  LV E/e' lateral: 10.3 LV IVS:        0.80 cm  LV e' medial:    6.09 cm/s LVOT diam:     1.90 cm  LV E/e' medial:  15.2 LV SV:         69 LV SV Index:   36 LVOT Area:     2.84 cm   RIGHT VENTRICLE RV S prime:     19.40 cm/s TAPSE (M-mode): 2.6 cm  LEFT ATRIUM             Index       RIGHT ATRIUM           Index LA diam:        3.40 cm 1.80 cm/m  RA Area:     17.50 cm LA Vol (A2C):   37.5 ml 19.85 ml/m RA Volume:   50.00 ml  26.47 ml/m LA Vol (A4C):   63.1 ml 33.40 ml/m LA Biplane Vol: 50.1 ml 26.52 ml/m AORTIC VALVE LVOT Vmax:   95.63 cm/s LVOT Vmean:  61.033 cm/s LVOT VTI:    0.242 m  AORTA Ao Root diam: 3.00 cm Ao Asc diam:  3.60 cm  MITRAL VALVE               TRICUSPID VALVE MV Area (PHT): cm         TR Peak grad:   19.5 mmHg MV Decel Time: 243 msec    TR Vmax:        221.00 cm/s MV E velocity: 92.60 cm/s MV A velocity: 94.10 cm/s  SHUNTS MV E/A ratio:  0.98        Systemic VTI:  0.24 m Systemic Diam: 1.90 cm  Charlton Haws MD Electronically signed by Charlton Haws MD Signature  Date/Time: 07/05/2019/2:29:17 PM    Final            Recent Labs: 03/16/2022: ALT 10; BUN 15; Creat 1.12;  Hemoglobin 11.9; Platelets 230; Potassium 4.3; Sodium 140  Recent Lipid Panel    Component Value Date/Time   CHOL 206 (H) 03/16/2022 1009   TRIG 66 03/16/2022 1009   HDL 71 03/16/2022 1009   CHOLHDL 2.9 03/16/2022 1009   VLDL 13 04/22/2016 1344   LDLCALC 119 (H) 03/16/2022 1009    History of Present Illness    62 year old female with the above past medical history including paroxysmal atrial fibrillation, hypertension, hyperlipidemia, and HIV.   Stress test in 2019 was low risk.  Echocardiogram in 2021 showed EF 60 to 65%, normal LV function, no RWMA.  She has a history of atrial fibrillation on warfarin due to cost.  She was last seen in the office on 07/09/2022 and was stable from a cardiac standpoint.  She was maintaining sinus rhythm at a time.  BP was well-controlled.  She had some weakness in her legs, myalgias.  2-week statin holiday was advised.   She presents today for follow-up.  Since her last visit she has been  1. Paroxysmal atrial fibrillation: Maintaining sinus rhythm.  Continue metoprolol, Xarelto.   2. Hypertension: BP well controlled. Continue current antihypertensive regimen.    3. Hyperlipidemia: LDL was 119 in 03/2022.  She notes myalgias, weakness in her legs.  Will trial 2-week statin holiday to see if her symptoms improve.  If they do, consider transitioning from Lipitor to Crestor.   4. Disposition: Follow-up in 2 months.  I advised her to contact our [facility] office to discuss reassignment to a new cardiologist (patient of Dr. Katrinka Blazing prior to his retirement).      Home Medications    Current Outpatient Medications  Medication Sig Dispense Refill   atorvastatin (LIPITOR) 10 MG tablet Take 4 tablets (40 mg total) by mouth daily. 30 tablet 11   bictegravir-emtricitabine-tenofovir AF (BIKTARVY) 50-200-25 MG TABS tablet Take 1 tablet by mouth daily.  30 tablet 5   lisinopril-hydrochlorothiazide (ZESTORETIC) 10-12.5 MG tablet Take 1 tablet by mouth daily. 90 tablet 3   metoprolol succinate (TOPROL XL) 25 MG 24 hr tablet Take 1 tablet (25 mg total) by mouth daily. 90 tablet 3   omeprazole (PRILOSEC) 20 MG capsule Take 1 capsule (20 mg total) by mouth 2 (two) times daily before a meal for 28 days, THEN 1 capsule (20 mg total) daily for 28 days. 84 capsule 0   rivaroxaban (XARELTO) 20 MG TABS tablet Take 1 tablet (20 mg total) by mouth daily with supper. 90 tablet 1   traZODone (DESYREL) 50 MG tablet Take 0.5 tablets (25 mg total) by mouth at bedtime. 30 tablet 3   Vitamin D, Ergocalciferol, 50000 units CAPS Take 1 capsule by mouth once a week. 8 capsule 0   No current facility-administered medications for this visit.     Review of Systems    ***.  All other systems reviewed and are otherwise negative except as noted above.    Physical Exam    VS:  There were no vitals taken for this visit. , BMI There is no height or weight on file to calculate BMI.     GEN: Well nourished, well developed, in no acute distress. HEENT: normal. Neck: Supple, no JVD, carotid bruits, or masses. Cardiac: RRR, no murmurs, rubs, or gallops. No clubbing, cyanosis, edema.  Radials/DP/PT 2+ and equal bilaterally.  Respiratory:  Respirations regular and unlabored, clear to auscultation bilaterally. GI: Soft, nontender, nondistended, BS + x 4. MS: no deformity or atrophy. Skin: warm and dry,  no rash. Neuro:  Strength and sensation are intact. Psych: Normal affect.  Accessory Clinical Findings    ECG personally reviewed by me today -    - no acute changes.   Lab Results  Component Value Date   WBC 3.2 (L) 03/16/2022   HGB 11.9 03/16/2022   HCT 35.4 03/16/2022   MCV 91.9 03/16/2022   PLT 230 03/16/2022   Lab Results  Component Value Date   CREATININE 1.12 (H) 03/16/2022   BUN 15 03/16/2022   NA 140 03/16/2022   K 4.3 03/16/2022   CL 108 03/16/2022    CO2 25 03/16/2022   Lab Results  Component Value Date   ALT 10 03/16/2022   AST 21 03/16/2022   ALKPHOS 94 06/05/2021   BILITOT 0.4 03/16/2022   Lab Results  Component Value Date   CHOL 206 (H) 03/16/2022   HDL 71 03/16/2022   LDLCALC 119 (H) 03/16/2022   TRIG 66 03/16/2022   CHOLHDL 2.9 03/16/2022    Lab Results  Component Value Date   HGBA1C 5.3 05/19/2019    Assessment & Plan    1.  ***  No BP recorded.  {Refresh Note OR Click here to enter BP  :1}***   Joylene Grapes, NP 09/14/2022, 6:36 AM

## 2022-09-27 ENCOUNTER — Ambulatory Visit (HOSPITAL_COMMUNITY)
Admission: EM | Admit: 2022-09-27 | Discharge: 2022-09-27 | Disposition: A | Discharge status: Home / Self Care | Payer: 59 | Attending: Family Medicine | Admitting: Family Medicine

## 2022-09-27 ENCOUNTER — Encounter (HOSPITAL_COMMUNITY): Payer: Self-pay | Admitting: Family Medicine

## 2022-09-27 DIAGNOSIS — M25512 Pain in left shoulder: Secondary | ICD-10-CM

## 2022-09-27 MED ORDER — BACLOFEN 5 MG PO TABS: 5.0000 mg | ORAL_TABLET | Freq: Two times a day (BID) | ORAL | 0 refills | Status: DC | PRN

## 2022-09-27 MED ORDER — IBUPROFEN 400 MG PO TABS: 400.0000 mg | ORAL_TABLET | Freq: Three times a day (TID) | ORAL | 0 refills | Status: AC | PRN
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
## 2022-09-27 NOTE — Discharge Instructions (Signed)
It was nice seeing you today. I am sorry about your shoulder pain. You likely have muscle strain with ligament inflammation as well. We will treat you with muscle relaxant and Ibuprofen. Please follow up with your PCP in the next week for reassessment.

## 2022-10-12 ENCOUNTER — Other Ambulatory Visit: Payer: Self-pay | Admitting: Infectious Diseases

## 2022-10-12 DIAGNOSIS — Z1231 Encounter for screening mammogram for malignant neoplasm of breast: Secondary | ICD-10-CM

## 2022-10-22 ENCOUNTER — Ambulatory Visit
Admission: RE | Admit: 2022-10-22 | Discharge: 2022-10-22 | Disposition: A | Discharge status: Home / Self Care | Payer: 59 | Source: Ambulatory Visit | Attending: Infectious Diseases | Admitting: Infectious Diseases

## 2022-10-22 DIAGNOSIS — Z1231 Encounter for screening mammogram for malignant neoplasm of breast: Secondary | ICD-10-CM

## 2022-10-28 DIAGNOSIS — I10 Essential (primary) hypertension: Secondary | ICD-10-CM | POA: Diagnosis not present

## 2022-10-28 DIAGNOSIS — D849 Immunodeficiency, unspecified: Secondary | ICD-10-CM | POA: Diagnosis not present

## 2022-10-28 DIAGNOSIS — H9193 Unspecified hearing loss, bilateral: Secondary | ICD-10-CM | POA: Diagnosis not present

## 2022-10-28 DIAGNOSIS — M62838 Other muscle spasm: Secondary | ICD-10-CM | POA: Diagnosis not present

## 2022-10-28 DIAGNOSIS — E669 Obesity, unspecified: Secondary | ICD-10-CM | POA: Diagnosis not present

## 2022-10-28 DIAGNOSIS — Z6830 Body mass index (BMI) 30.0-30.9, adult: Secondary | ICD-10-CM | POA: Diagnosis not present

## 2022-10-28 DIAGNOSIS — Z7901 Long term (current) use of anticoagulants: Secondary | ICD-10-CM | POA: Diagnosis not present

## 2022-10-28 DIAGNOSIS — D6869 Other thrombophilia: Secondary | ICD-10-CM | POA: Diagnosis not present

## 2022-10-28 DIAGNOSIS — Z809 Family history of malignant neoplasm, unspecified: Secondary | ICD-10-CM | POA: Diagnosis not present

## 2022-10-28 DIAGNOSIS — E785 Hyperlipidemia, unspecified: Secondary | ICD-10-CM | POA: Diagnosis not present

## 2022-10-28 DIAGNOSIS — I4891 Unspecified atrial fibrillation: Secondary | ICD-10-CM | POA: Diagnosis not present

## 2022-10-28 DIAGNOSIS — B20 Human immunodeficiency virus [HIV] disease: Secondary | ICD-10-CM | POA: Diagnosis not present

## 2022-11-01 ENCOUNTER — Ambulatory Visit (HOSPITAL_COMMUNITY)
Admission: EM | Admit: 2022-11-01 | Discharge: 2022-11-01 | Disposition: A | Discharge status: Home / Self Care | Payer: 59 | Attending: Emergency Medicine | Admitting: Emergency Medicine

## 2022-11-01 ENCOUNTER — Encounter (HOSPITAL_COMMUNITY): Payer: Self-pay | Admitting: Emergency Medicine

## 2022-11-01 DIAGNOSIS — Z21 Asymptomatic human immunodeficiency virus [HIV] infection status: Secondary | ICD-10-CM | POA: Insufficient documentation

## 2022-11-01 DIAGNOSIS — R110 Nausea: Secondary | ICD-10-CM

## 2022-11-01 DIAGNOSIS — Z113 Encounter for screening for infections with a predominantly sexual mode of transmission: Secondary | ICD-10-CM | POA: Diagnosis not present

## 2022-11-01 DIAGNOSIS — M25562 Pain in left knee: Secondary | ICD-10-CM

## 2022-11-01 DIAGNOSIS — Z87891 Personal history of nicotine dependence: Secondary | ICD-10-CM | POA: Diagnosis not present

## 2022-11-01 MED ORDER — ONDANSETRON 4 MG PO TBDP: 4.0000 mg | ORAL_TABLET | Freq: Four times a day (QID) | ORAL | 0 refills | Status: DC | PRN

## 2022-11-01 MED ORDER — ONDANSETRON 4 MG PO TBDP
4.0000 mg | ORAL_TABLET | Freq: Once | ORAL | Status: AC
  Administered 2022-11-01: 4 mg via ORAL

## 2022-11-01 MED ORDER — DICLOFENAC SODIUM 1 % EX GEL: 2.0000 g | Freq: Four times a day (QID) | CUTANEOUS | 0 refills | Status: DC

## 2022-11-01 MED ORDER — ONDANSETRON 4 MG PO TBDP
ORAL_TABLET | ORAL | Status: AC
  Filled 2022-11-01: qty 1

## 2022-11-01 NOTE — ED Triage Notes (Signed)
Pt reports nausea x 2 days. Pt stated she started taking hair and nails vitamins.  Pt stated left leg pain. Denies any recent trauma or injuries.

## 2022-11-01 NOTE — Discharge Instructions (Addendum)
For nausea you can use the zofran every 6 hours. Please drink lots of water! We will call you if anything on your swab returns positive. You can also see these results on MyChart. Please abstain from sexual intercourse until your results return.  For knee pain, avoid ibuprofen since you are taking Biktarvy. Tylenol is a safer option. Try the Voltaren gel up to 4x daily to help with knee pain. I recommend to ice and elevate the knee. Call orthopedics for a follow up appointment if still having symptoms.

## 2022-11-01 NOTE — ED Provider Notes (Signed)
[facility]    CSN: [id number] Arrival date & time: 11/01/22  1454     History   Chief Complaint Chief Complaint  Patient presents with   Leg Pain   Abdominal Cramping    HPI Vicki Brown is a 62 y.o. female.  A few day history of nausea. Started after unprotected intercourse. She is having some vaginal odor as well No abdominal pain, vomiting, dysuria, hematuria. No fevers Postmenopausal   Additionally a week or so of left knee pain. Feels when bending and standing. Denies injury or trauma. She is on her feet a lot at work. Took ibuprofen  HIV+ on Biktarvy  Past Medical History:  Diagnosis Date   Allergy    HIV infection (HCC)    Hyperlipidemia    Hypertension    Insomnia 12/19/2014    Patient Active Problem List   Diagnosis Date Noted   Hearing loss 12/15/2021   Left arm pain 02/17/2021   Iron deficiency anemia 08/08/2020   Healthcare maintenance 08/07/2020   Fatigue 07/12/2020   Screening for cervical cancer 02/21/2020   Multinodular goiter 06/01/2019   Elevated liver function tests 04/22/2018   Laryngopharyngeal reflux (LPR) 02/24/2018   Hyperthyroidism 01/27/2018   Hoarseness 01/27/2018   Atrial fibrillation (HCC) 10/21/2017   Hyperlipidemia 10/21/2017   Insomnia 12/19/2014   Perennial allergic rhinitis 12/28/2012   Dyslipidemia 07/31/2010   Lower abdominal pain 07/02/2008   Human immunodeficiency virus (HIV) disease (HCC) 01/19/2008   PERIPHERAL NEUROPATHY 01/19/2008   Essential hypertension 01/19/2008    Past Surgical History:  Procedure Laterality Date   Broken Arm Left    Pins placed in arm   TUBAL LIGATION      OB History     Gravida  2   Para      Term      Preterm      AB      Living  2      SAB      IAB      Ectopic      Multiple      Live Births               Home Medications    Prior to Admission medications   Medication Sig Start Date End Date Taking? Authorizing Provider  atorvastatin  (LIPITOR) 10 MG tablet Take 4 tablets (40 mg total) by mouth daily. 03/16/22  Yes Blanchard Kelch, NP  baclofen 5 MG TABS Take 1 tablet (5 mg total) by mouth 3 times/day as needed-between meals & bedtime for muscle spasms. 09/27/22  Yes Doreene Eland, MD  bictegravir-emtricitabine-tenofovir AF (BIKTARVY) 50-200-25 MG TABS tablet Take 1 tablet by mouth daily. 07/16/22  Yes Veryl Speak, FNP  diclofenac Sodium (VOLTAREN ARTHRITIS PAIN) 1 % GEL Apply 2 g topically 4 (four) times daily. 11/01/22  Yes Sophea Rackham, Lurena Joiner, PA-C  lisinopril-hydrochlorothiazide (ZESTORETIC) 10-12.5 MG tablet Take 1 tablet by mouth daily. 07/16/22  Yes Evlyn Kanner, MD  metoprolol succinate (TOPROL XL) 25 MG 24 hr tablet Take 1 tablet (25 mg total) by mouth daily. 06/21/19  Yes Lyn Records, MD  ondansetron (ZOFRAN-ODT) 4 MG disintegrating tablet Take 1 tablet (4 mg total) by mouth every 6 (six) hours as needed for nausea or vomiting. 11/01/22  Yes Konstantina Nachreiner, Lurena Joiner, PA-C  rivaroxaban (XARELTO) 20 MG TABS tablet Take 1 tablet (20 mg total) by mouth daily with supper. 04/10/22  Yes Lennette Bihari, MD  traZODone (DESYREL) 50 MG tablet Take  0.5 tablets (25 mg total) by mouth at bedtime. 10/24/19  Yes Ginnie Smart, MD  Vitamin D, Ergocalciferol, 50000 units CAPS Take 1 capsule by mouth once a week. 07/22/20  Yes Blanchard Kelch, NP  omeprazole (PRILOSEC) 20 MG capsule Take 1 capsule (20 mg total) by mouth 2 (two) times daily before a meal for 28 days, THEN 1 capsule (20 mg total) daily for 28 days. 07/12/20 07/09/22  Blanchard Kelch, NP    Family History Family History  Problem Relation Age of Onset   Cancer Mother        uterine vs ovarian?   Seizures Father    Mental illness Brother    Breast cancer Maternal Aunt    Diabetes Daughter    Colon polyps Neg Hx    Esophageal cancer Neg Hx    Rectal cancer Neg Hx    Stomach cancer Neg Hx    Thyroid disease Neg Hx     Social History Social History   Tobacco Use    Smoking status: Former    Current packs/day: 0.00    Average packs/day: 2.0 packs/day for 20.0 years (40.0 ttl pk-yrs)    Types: Cigarettes    Start date: 60    Quit date: 2004    Years since quitting: 20.6   Smokeless tobacco: Never   Tobacco comments:    20 years ago  Vaping Use   Vaping status: Never Used  Substance Use Topics   Alcohol use: Not Currently    Alcohol/week: 6.0 standard drinks of alcohol    Types: 6 Cans of beer per week   Drug use: Yes    Frequency: 0.5 times per week    Types: Marijuana     Allergies   Patient has no known allergies.   Review of Systems Review of Systems Per HPI  Physical Exam Triage Vital Signs ED Triage Vitals  Encounter Vitals Group     BP 11/01/22 1622 121/71     Systolic BP Percentile --      Diastolic BP Percentile --      Pulse Rate 11/01/22 1622 77     Resp 11/01/22 1622 16     Temp 11/01/22 1622 98.4 F (36.9 C)     Temp Source 11/01/22 1622 Oral     SpO2 11/01/22 1622 98 %     Weight --      Height --      Head Circumference --      Peak Flow --      Pain Score 11/01/22 1631 4     Pain Loc --      Pain Education --      Exclude from Growth Chart --    No data found.  Updated Vital Signs BP 121/71 (BP Location: Left Arm)   Pulse 77   Temp 98.4 F (36.9 C) (Oral)   Resp 16   SpO2 98%   Physical Exam Vitals and nursing note reviewed.  Constitutional:      General: She is not in acute distress.    Appearance: Normal appearance.  HENT:     Mouth/Throat:     Pharynx: Oropharynx is clear.  Cardiovascular:     Rate and Rhythm: Normal rate and regular rhythm.     Heart sounds: Normal heart sounds.  Pulmonary:     Effort: Pulmonary effort is normal.     Breath sounds: Normal breath sounds.  Abdominal:     Palpations: Abdomen is soft.  Tenderness: There is no abdominal tenderness. There is no right CVA tenderness, left CVA tenderness, guarding or rebound.  Musculoskeletal:        General: No  swelling, tenderness, deformity or signs of injury. Normal range of motion.     Cervical back: Normal range of motion.     Left lower leg: No edema.     Comments: Good ROM left knee. No obvious swelling or deformity. No effusion or erythema. Distal sensation intact. Strong DP pulse.   Skin:    General: Skin is warm and dry.  Neurological:     Mental Status: She is alert and oriented to person, place, and time.     UC Treatments / Results  Labs (all labs ordered are listed, but only abnormal results are displayed) Labs Reviewed  CERVICOVAGINAL ANCILLARY ONLY    EKG  Radiology No results found.  Procedures Procedures   Medications Ordered in UC Medications  ondansetron (ZOFRAN-ODT) disintegrating tablet 4 mg (4 mg Oral Given 11/01/22 1716)    Initial Impression / Assessment and Plan / UC Course  I have reviewed the triage vital signs and the nursing notes.  Pertinent labs & imaging results that were available during my care of the patient were reviewed by me and considered in my medical decision making (see chart for details).  Zofran ODT given in clinic Sent to use at home q6 hours prn Cytology swab is pending. Treat positive result as indicated. Set patient up with mychart so results will be available to her.  Knee pain No indication for xray today, no bony tenderness Recommend tylenol instead of ibuprofen. Can try Voltaren gel for the next week or so along with ice and elevate. Advised follow with ortho if persisting  Return and ED precautions Work note provided Patient agreeable to plan  Final Clinical Impressions(s) / UC Diagnoses   Final diagnoses:  Screen for STD (sexually transmitted disease)  Nausea without vomiting  Acute pain of left knee     Discharge Instructions      For nausea you can use the zofran every 6 hours. Please drink lots of water! We will call you if anything on your swab returns positive. You can also see these results on MyChart.  Please abstain from sexual intercourse until your results return.  For knee pain, avoid ibuprofen since you are taking Biktarvy. Tylenol is a safer option. Try the Voltaren gel up to 4x daily to help with knee pain. I recommend to ice and elevate the knee. Call orthopedics for a follow up appointment if still having symptoms.     ED Prescriptions     Medication Sig Dispense Auth. Provider   ondansetron (ZOFRAN-ODT) 4 MG disintegrating tablet Take 1 tablet (4 mg total) by mouth every 6 (six) hours as needed for nausea or vomiting. 20 tablet Jayonna Meyering, PA-C   diclofenac Sodium (VOLTAREN ARTHRITIS PAIN) 1 % GEL Apply 2 g topically 4 (four) times daily. 50 g Miro Balderson, Lurena Joiner, PA-C      PDMP not reviewed this encounter.   Marlow Baars, [place] 11/01/22 1735

## 2022-11-02 ENCOUNTER — Telehealth: Payer: Self-pay

## 2022-11-02 MED ORDER — FLUCONAZOLE 150 MG PO TABS: 150.0000 mg | ORAL_TABLET | Freq: Once | ORAL | 0 refills | Status: DC

## 2022-11-02 MED ORDER — METRONIDAZOLE 500 MG PO TABS: 500.0000 mg | ORAL_TABLET | Freq: Two times a day (BID) | ORAL | 0 refills | Status: DC

## 2022-11-02 MED ORDER — METRONIDAZOLE 500 MG PO TABS: 500.0000 mg | ORAL_TABLET | Freq: Two times a day (BID) | ORAL | 0 refills | Status: AC

## 2022-11-02 MED ORDER — FLUCONAZOLE 150 MG PO TABS: 150.0000 mg | ORAL_TABLET | Freq: Once | ORAL | 0 refills | Status: AC

## 2022-11-02 NOTE — Telephone Encounter (Signed)
Pt returned call. Pharmacy changed per request.

## 2022-11-02 NOTE — Telephone Encounter (Signed)
 Per protocol, pt requires tx with metronidazole and Diflucan. Attempted to reach patient x1. LVM. Rx sent to pharmacy on file.

## 2022-11-04 LAB — CERVICOVAGINAL ANCILLARY ONLY
Bacterial Vaginitis (gardnerella): POSITIVE — AB
Candida Glabrata: NEGATIVE
Candida Vaginitis: POSITIVE — AB
Chlamydia: NEGATIVE
Comment: NEGATIVE
Comment: NEGATIVE
Comment: NEGATIVE
Comment: NEGATIVE
Comment: NEGATIVE
Comment: NORMAL
Neisseria Gonorrhea: NEGATIVE
Trichomonas: NEGATIVE

## 2022-11-16 ENCOUNTER — Encounter: Payer: Self-pay | Admitting: Family

## 2022-11-16 ENCOUNTER — Other Ambulatory Visit: Payer: Self-pay

## 2022-11-16 ENCOUNTER — Ambulatory Visit (INDEPENDENT_AMBULATORY_CARE_PROVIDER_SITE_OTHER): Payer: 59 | Admitting: Family

## 2022-11-16 VITALS — BP 106/72 | HR 76 | Temp 97.7°F | Ht 66.0 in | Wt 182.0 lb

## 2022-11-16 DIAGNOSIS — B20 Human immunodeficiency virus [HIV] disease: Secondary | ICD-10-CM

## 2022-11-16 DIAGNOSIS — Z23 Encounter for immunization: Secondary | ICD-10-CM | POA: Diagnosis not present

## 2022-11-16 DIAGNOSIS — Z0000 Encounter for general adult medical examination without abnormal findings: Secondary | ICD-10-CM

## 2022-11-16 MED ORDER — BIKTARVY 50-200-25 MG PO TABS: 1.0000 | ORAL_TABLET | Freq: Every day | ORAL | 5 refills | Status: DC

## 2022-11-16 NOTE — Patient Instructions (Signed)
Nice to see you. ? ?We will check your lab work today. ? ?Continue to take your medication daily as prescribed. ? ?Refills have been sent to the pharmacy. ? ?Plan for follow up in 6 months or sooner if needed with lab work on the same day. ? ?Have a great day and stay safe! ? ?

## 2022-11-16 NOTE — Progress Notes (Unsigned)
Brief Narrative   Patient ID: Vicki Brown, female    DOB: [birth year], 62 y.o.   MRN: [id number]  [name] is a 62 y/o AA female diagnosed with HIV-1 disease in March of 2009 with risk factor of heterosexual contact. Initial viral load was 338,000 with CD4 count 462. Entered care at [facility] Stage 2. No Genotype data available. No history of opportunistic infection. ART experienced with Atripla and currently Biktarvy.   Subjective:    Chief Complaint  Patient presents with   Follow-up    B20    HPI:  [name] is a 62 y.o. female with HIV disease last seen for HIV care on 03/16/22 by Rexene Alberts, NP with well controlled virus and good adherence and tolerance to Biktarvy. Viral load was undetectable and CD4 count 957. Here today for follow up.  [name] has been doing well since her last office visit and continues to take [facility] as prescribed with no adverse side effects or problems obtaining medication. Covered by [employer]. No new concerns/complaints. Condoms and STD testing offered. Healthcare maintenance due includes influenza vaccination. Routine dental care up to date.  Denies fevers, chills, night sweats, headaches, changes in vision, neck pain/stiffness, nausea, diarrhea, vomiting, lesions or rashes.  Lab Results  Component Value Date   CD4TCELL 62 (H) 02/03/2021   CD4TABS 957 02/07/2020   Lab Results  Component Value Date   HIV1RNAQUANT Not Detected 03/16/2022     No Known Allergies    Outpatient Medications Prior to Visit  Medication Sig Dispense Refill   atorvastatin (LIPITOR) 10 MG tablet Take 4 tablets (40 mg total) by mouth daily. 30 tablet 11   diclofenac Sodium (VOLTAREN ARTHRITIS PAIN) 1 % GEL Apply 2 g topically 4 (four) times daily. 50 g 0   lisinopril-hydrochlorothiazide (ZESTORETIC) 10-12.5 MG tablet Take 1 tablet by mouth daily. 90 tablet 3   metoprolol succinate (TOPROL XL) 25 MG 24 hr tablet Take 1 tablet (25 mg total) by mouth daily. 90 tablet 3    ondansetron (ZOFRAN-ODT) 4 MG disintegrating tablet Take 1 tablet (4 mg total) by mouth every 6 (six) hours as needed for nausea or vomiting. 20 tablet 0   rivaroxaban (XARELTO) 20 MG TABS tablet Take 1 tablet (20 mg total) by mouth daily with supper. 90 tablet 1   traZODone (DESYREL) 50 MG tablet Take 0.5 tablets (25 mg total) by mouth at bedtime. 30 tablet 3   Vitamin D, Ergocalciferol, 50000 units CAPS Take 1 capsule by mouth once a week. 8 capsule 0   bictegravir-emtricitabine-tenofovir AF (BIKTARVY) 50-200-25 MG TABS tablet Take 1 tablet by mouth daily. 30 tablet 5   baclofen 5 MG TABS Take 1 tablet (5 mg total) by mouth 3 times/day as needed-between meals & bedtime for muscle spasms. (Patient not taking: Reported on 11/16/2022) 15 tablet 0   omeprazole (PRILOSEC) 20 MG capsule Take 1 capsule (20 mg total) by mouth 2 (two) times daily before a meal for 28 days, THEN 1 capsule (20 mg total) daily for 28 days. 84 capsule 0   No facility-administered medications prior to visit.     Past Medical History:  Diagnosis Date   Allergy    HIV infection (HCC)    Hyperlipidemia    Hypertension    Insomnia 12/19/2014     Past Surgical History:  Procedure Laterality Date   Broken Arm Left    Pins placed in arm   TUBAL LIGATION  Review of Systems  Constitutional:  Negative for appetite change, chills, diaphoresis, fatigue, fever and unexpected weight change.  Eyes:        Negative for acute change in vision  Respiratory:  Negative for chest tightness, shortness of breath and wheezing.   Cardiovascular:  Negative for chest pain.  Gastrointestinal:  Negative for diarrhea, nausea and vomiting.  Genitourinary:  Negative for dysuria, pelvic pain and vaginal discharge.  Musculoskeletal:  Negative for neck pain and neck stiffness.  Skin:  Negative for rash.  Neurological:  Negative for seizures, syncope, weakness and headaches.  Hematological:  Negative for adenopathy. Does not  bruise/bleed easily.  Psychiatric/Behavioral:  Negative for hallucinations.       Objective:    BP 106/72   Pulse 76   Temp 97.7 F (36.5 C) (Temporal)   Ht 5\' 6"  (1.676 m)   Wt 182 lb (82.6 kg)   SpO2 98%   BMI 29.38 kg/m  Nursing note and vital signs reviewed.  Physical Exam Constitutional:      General: She is not in acute distress.    Appearance: She is well-developed.  Eyes:     Conjunctiva/sclera: Conjunctivae normal.  Cardiovascular:     Rate and Rhythm: Normal rate and regular rhythm.     Heart sounds: Normal heart sounds. No murmur heard.    No friction rub. No gallop.  Pulmonary:     Effort: Pulmonary effort is normal. No respiratory distress.     Breath sounds: Normal breath sounds. No wheezing or rales.  Chest:     Chest wall: No tenderness.  Abdominal:     General: Bowel sounds are normal.     Palpations: Abdomen is soft.     Tenderness: There is no abdominal tenderness.  Musculoskeletal:     Cervical back: Neck supple.  Lymphadenopathy:     Cervical: No cervical adenopathy.  Skin:    General: Skin is warm and dry.     Findings: No rash.  Neurological:     Mental Status: She is alert and oriented to person, place, and time.  Psychiatric:        Behavior: Behavior normal.        Thought Content: Thought content normal.        Judgment: Judgment normal.         11/16/2022    1:49 PM 05/11/2022    2:49 PM 12/12/2021    9:55 AM 06/05/2021    3:06 PM 08/08/2020    4:14 PM  Depression screen PHQ 2/9  Decreased Interest 0 0 0 0 0  Down, Depressed, Hopeless 0 0 0 0 0  PHQ - 2 Score 0 0 0 0 0  Altered sleeping     1  Tired, decreased energy     1  Change in appetite     1  Feeling bad or failure about yourself      0  Trouble concentrating     0  Moving slowly or fidgety/restless     0  Suicidal thoughts     0  PHQ-9 Score     3  Difficult doing work/chores     Not difficult at all       Assessment & Plan:    Patient Active Problem List    Diagnosis Date Noted   Hearing loss 12/15/2021   Left arm pain 02/17/2021   Iron deficiency anemia 08/08/2020   Healthcare maintenance 08/07/2020   Fatigue 07/12/2020   Screening for cervical  cancer 02/21/2020   Multinodular goiter 06/01/2019   Elevated liver function tests 04/22/2018   Laryngopharyngeal reflux (LPR) 02/24/2018   Hyperthyroidism 01/27/2018   Hoarseness 01/27/2018   Atrial fibrillation (HCC) 10/21/2017   Hyperlipidemia 10/21/2017   Insomnia 12/19/2014   Perennial allergic rhinitis 12/28/2012   Dyslipidemia 07/31/2010   Lower abdominal pain 07/02/2008   Human immunodeficiency virus (HIV) disease (HCC) 01/19/2008   PERIPHERAL NEUROPATHY 01/19/2008   Essential hypertension 01/19/2008     Problem List Items Addressed This Visit       Other   Human immunodeficiency virus (HIV) disease (HCC) - Primary (Chronic)    Ms. Arney continues to have well controlled virus with good adherence and tolerance to [employer].  Reviewed lab work and discussed plan of care, U equals U, and family planning. Check lab work. Continue current dose of Biktarvy. Plan for follow up in  6 months or sooner if needed with lab work on the same day.       Relevant Medications   bictegravir-emtricitabine-tenofovir AF (BIKTARVY) 50-200-25 MG TABS tablet   Other Relevant Orders   COMPLETE METABOLIC PANEL WITH GFR (Completed)   T-helper cell (CD4)- ([facility] clinic only)   HIV-1 RNA quant-no reflex-bld   Healthcare maintenance    Discussed importance of safe sexual practice and condom use. Condoms and STD testing offered.  Influenza and Prevnar 20 vaccination updated.  Routine dental care up to date - has dentures Breast cancer and cervical cancer screenings up to date.       Other Visit Diagnoses     Encounter for immunization       Relevant Orders   Flu vaccine trivalent PF, 6mos and older(Flulaval,Afluria,Fluarix,Fluzone) (Completed)   Need for pneumococcal 20-valent conjugate vaccination        Relevant Orders   Pneumococcal conjugate vaccine 20-valent (Completed)        I am having Mayar L. Damron maintain her metoprolol succinate, traZODone, omeprazole, Vitamin D (Ergocalciferol), atorvastatin, rivaroxaban, lisinopril-hydrochlorothiazide, Baclofen, ondansetron, diclofenac Sodium, and Biktarvy.   Meds ordered this encounter  Medications   bictegravir-emtricitabine-tenofovir AF (BIKTARVY) 50-200-25 MG TABS tablet    Sig: Take 1 tablet by mouth daily.    Dispense:  30 tablet    Refill:  5    Order Specific Question:   Supervising Provider    Answer:   Judyann Munson [[id number]]     Follow-up: Return in about 6 months (around 05/16/2023). or sooner if needed.    Marcos Eke, MSN, FNP-C Nurse Practitioner [facility] for Infectious Disease [facility] Main number: [phone]

## 2022-11-17 ENCOUNTER — Encounter: Payer: Self-pay | Admitting: Family

## 2022-11-17 NOTE — Assessment & Plan Note (Addendum)
Discussed importance of safe sexual practice and condom use. Condoms and STD testing offered.  Influenza and Prevnar 20 vaccination updated.  Routine dental care up to date - has dentures Breast cancer and cervical cancer screenings up to date.

## 2022-11-17 NOTE — Assessment & Plan Note (Signed)
Vicki Brown continues to have well controlled virus with good adherence and tolerance to [place].  Reviewed lab work and discussed plan of care, U equals U, and family planning. Check lab work. Continue current dose of Biktarvy. Plan for follow up in  6 months or sooner if needed with lab work on the same day.

## 2022-11-18 LAB — T-HELPER CELL (CD4) - (RCID CLINIC ONLY)
CD4 % Helper T Cell: 59 % (ref 33–65)
CD4 T Cell Abs: 856 /uL (ref 400–1790)

## 2022-11-18 LAB — COMPLETE METABOLIC PANEL WITH GFR
AG Ratio: 1.2 (calc) (ref 1.0–2.5)
ALT: 11 U/L (ref 6–29)
AST: 19 U/L (ref 10–35)
Albumin: 3.7 g/dL (ref 3.6–5.1)
Alkaline phosphatase (APISO): 65 U/L (ref 37–153)
BUN: 10 mg/dL (ref 7–25)
CO2: 24 mmol/L (ref 20–32)
Calcium: 9.1 mg/dL (ref 8.6–10.4)
Chloride: 108 mmol/L (ref 98–110)
Creat: 1.04 mg/dL (ref 0.50–1.05)
Globulin: 3.1 g/dL (ref 1.9–3.7)
Glucose, Bld: 92 mg/dL (ref 65–99)
Potassium: 4 mmol/L (ref 3.5–5.3)
Sodium: 140 mmol/L (ref 135–146)
Total Bilirubin: 0.3 mg/dL (ref 0.2–1.2)
Total Protein: 6.8 g/dL (ref 6.1–8.1)
eGFR: 60 mL/min/{1.73_m2} (ref >=60)

## 2022-11-18 LAB — HIV-1 RNA QUANT-NO REFLEX-BLD
HIV 1 RNA Quant: NOT DETECTED {copies}/mL
HIV-1 RNA Quant, Log: NOT DETECTED {Log_copies}/mL

## 2022-11-19 ENCOUNTER — Telehealth: Payer: Self-pay

## 2022-11-19 NOTE — Telephone Encounter (Signed)
Patient informed of lab results and follow up appointment made. Vicki Brown Vicki Brown

## 2022-11-19 NOTE — Telephone Encounter (Signed)
-----   Message from Jeanine Luz sent at 11/18/2022 10:29 PM EDT ----- Please inform Vicki Brown that her viral load is undetectable and CD4 count is 856. Kidney function, liver function and electrolytes are normal. Thanks!

## 2023-03-22 ENCOUNTER — Other Ambulatory Visit (HOSPITAL_COMMUNITY): Payer: Self-pay

## 2023-03-25 ENCOUNTER — Telehealth: Payer: Self-pay

## 2023-03-25 ENCOUNTER — Other Ambulatory Visit (HOSPITAL_COMMUNITY): Payer: Self-pay

## 2023-03-25 NOTE — Telephone Encounter (Signed)
Patient called with difficulty getting [facility]. [facility], [facility] have [facility]AP information on file, but it is being denied.   Routing to financial team for assistance.   Sandie Ano, RN

## 2023-03-25 NOTE — Telephone Encounter (Signed)
Patient called office again requesting updated on medication. States she has been off meds for 3 days due to issues with coverage. Spoke with [name] who will check on status for ICAP. Lupita Leash, Patient advocate will call [facility] with [place] card info to get meds today. With 0 copay [name], RMA

## 2023-03-25 NOTE — Telephone Encounter (Signed)
Patient was having trouble obtaining B[facility]vy from [facility]. Her ADAP was up to date so I emailed the state. Charmaine stated her ICAP was approved and in [place]. She will contact the pharmacy directly to provide the needed information. I provided the contact information [facility] [address] in [place].

## 2023-03-25 NOTE — Telephone Encounter (Signed)
Patient called back, relayed per previous note that copay card has been applied and medication should be ready to pick up. Recommended she call the pharmacy to confirm.   Sandie Ano, RN

## 2023-03-25 NOTE — Telephone Encounter (Signed)
Vicki Brown, Pharmacy tech was able to provide [facility] with [place] copay card info. Pharmacy will have prescription ready in 30 mins.  Not able to reach pt to update her. [name] will work on [employer] for pt.  [name], RMA

## 2023-04-15 ENCOUNTER — Other Ambulatory Visit: Payer: Self-pay | Admitting: Cardiovascular Disease

## 2023-04-15 ENCOUNTER — Other Ambulatory Visit: Payer: Self-pay | Admitting: Infectious Diseases

## 2023-04-15 DIAGNOSIS — I4811 Longstanding persistent atrial fibrillation: Secondary | ICD-10-CM

## 2023-04-15 NOTE — Telephone Encounter (Signed)
 Prescription refill request for Xarelto  received.  Indication: Afib  Last office visit: 07/09/22 ([place])  Weight: 82.6kg Age: 63 Scr: 1.04 (11/16/22)  CrCl: 71.39ml/min  Appropriate dose. Refill sent.

## 2023-04-29 ENCOUNTER — Telehealth: Payer: Self-pay | Admitting: Physician Assistant

## 2023-04-29 ENCOUNTER — Telehealth: Payer: Self-pay

## 2023-04-29 NOTE — Telephone Encounter (Signed)
 STAT if patient feels like he/she is going to faint   1. Are you feeling dizzy, lightheaded, or faint right now?   Yes    2. Have you passed out?  No  (If yes move to .SYNCOPECHMG)   3. Do you have any other symptoms?   A little nausea and heart burn, no energy   4. Have you checked your HR and BP (record if available)?   No  Patient is concerned she has been feeling lightheaded with no energy.

## 2023-04-29 NOTE — Telephone Encounter (Signed)
 Patient called office stating she has been having dizzy spells off and on for three days. Has also had changes in balance. Denies any other symptoms.  Advised patient to contact cardiology  team and PCP for appt. If symptoms worsen needs to be seen at Urgent Care/ ED for evaluation. [name], RMA

## 2023-04-29 NOTE — Telephone Encounter (Signed)
 Spoke with pt regarding symptoms of dizziness, nausea and heartburn. Pt stated she has been experiencing dizziness and lightheadedness for 2-3 days on and off. Pt has no way to check HR or BP at home. Pt stated she is having sob with activity such as washing dishes and cooking dinner. She also stated she is having chest pain but believes it is heartburn. Pt was advised to have someone drive her to either the ED or an urgent care to get checked out. Pt verbalized understanding.

## 2023-04-30 ENCOUNTER — Other Ambulatory Visit: Payer: Self-pay

## 2023-04-30 ENCOUNTER — Emergency Department (HOSPITAL_COMMUNITY): Payer: 59

## 2023-04-30 ENCOUNTER — Encounter: Payer: Self-pay | Admitting: Student

## 2023-04-30 ENCOUNTER — Ambulatory Visit (INDEPENDENT_AMBULATORY_CARE_PROVIDER_SITE_OTHER): Payer: 59 | Admitting: Student

## 2023-04-30 ENCOUNTER — Observation Stay (HOSPITAL_COMMUNITY)
Admission: EM | Admit: 2023-04-30 | Discharge: 2023-05-02 | Disposition: A | Discharge status: Home / Self Care | Payer: 59 | Attending: Internal Medicine | Admitting: Internal Medicine

## 2023-04-30 ENCOUNTER — Ambulatory Visit (HOSPITAL_COMMUNITY)
Admission: RE | Admit: 2023-04-30 | Discharge: 2023-04-30 | Disposition: A | Discharge status: Home / Self Care | Payer: 59 | Source: Ambulatory Visit | Attending: Internal Medicine | Admitting: Internal Medicine

## 2023-04-30 ENCOUNTER — Encounter (HOSPITAL_COMMUNITY): Payer: Self-pay | Admitting: Emergency Medicine

## 2023-04-30 VITALS — BP 107/55 | HR 96 | Temp 98.0°F | Ht 66.0 in | Wt 183.2 lb

## 2023-04-30 DIAGNOSIS — R0602 Shortness of breath: Secondary | ICD-10-CM | POA: Diagnosis not present

## 2023-04-30 DIAGNOSIS — R42 Dizziness and giddiness: Secondary | ICD-10-CM

## 2023-04-30 DIAGNOSIS — K2951 Unspecified chronic gastritis with bleeding: Principal | ICD-10-CM | POA: Insufficient documentation

## 2023-04-30 DIAGNOSIS — K921 Melena: Secondary | ICD-10-CM

## 2023-04-30 DIAGNOSIS — R079 Chest pain, unspecified: Secondary | ICD-10-CM

## 2023-04-30 DIAGNOSIS — K449 Diaphragmatic hernia without obstruction or gangrene: Secondary | ICD-10-CM | POA: Insufficient documentation

## 2023-04-30 DIAGNOSIS — I4891 Unspecified atrial fibrillation: Secondary | ICD-10-CM | POA: Insufficient documentation

## 2023-04-30 DIAGNOSIS — B20 Human immunodeficiency virus [HIV] disease: Secondary | ICD-10-CM | POA: Insufficient documentation

## 2023-04-30 DIAGNOSIS — B9681 Helicobacter pylori [H. pylori] as the cause of diseases classified elsewhere: Secondary | ICD-10-CM | POA: Diagnosis not present

## 2023-04-30 DIAGNOSIS — K222 Esophageal obstruction: Secondary | ICD-10-CM | POA: Insufficient documentation

## 2023-04-30 DIAGNOSIS — Z87891 Personal history of nicotine dependence: Secondary | ICD-10-CM | POA: Diagnosis not present

## 2023-04-30 DIAGNOSIS — K625 Hemorrhage of anus and rectum: Secondary | ICD-10-CM | POA: Diagnosis not present

## 2023-04-30 DIAGNOSIS — K269 Duodenal ulcer, unspecified as acute or chronic, without hemorrhage or perforation: Secondary | ICD-10-CM | POA: Insufficient documentation

## 2023-04-30 DIAGNOSIS — I10 Essential (primary) hypertension: Secondary | ICD-10-CM | POA: Insufficient documentation

## 2023-04-30 DIAGNOSIS — Z79899 Other long term (current) drug therapy: Secondary | ICD-10-CM | POA: Diagnosis not present

## 2023-04-30 DIAGNOSIS — Z7901 Long term (current) use of anticoagulants: Secondary | ICD-10-CM | POA: Diagnosis not present

## 2023-04-30 DIAGNOSIS — K922 Gastrointestinal hemorrhage, unspecified: Secondary | ICD-10-CM | POA: Diagnosis present

## 2023-04-30 DIAGNOSIS — R0789 Other chest pain: Secondary | ICD-10-CM | POA: Diagnosis not present

## 2023-04-30 DIAGNOSIS — D509 Iron deficiency anemia, unspecified: Secondary | ICD-10-CM | POA: Diagnosis not present

## 2023-04-30 DIAGNOSIS — D649 Anemia, unspecified: Principal | ICD-10-CM

## 2023-04-30 LAB — IRON AND TIBC
Iron: 15 ug/dL — ABNORMAL LOW (ref 28–170)
Saturation Ratios: 3 % — ABNORMAL LOW (ref 10.4–31.8)
TIBC: 571 ug/dL — ABNORMAL HIGH (ref 250–450)
UIBC: 556 ug/dL

## 2023-04-30 LAB — TSH: TSH: 1.149 u[IU]/mL (ref 0.350–4.500)

## 2023-04-30 LAB — CBC WITH DIFFERENTIAL/PLATELET
Abs Immature Granulocytes: 0 10*3/uL (ref 0.00–0.07)
Basophils Absolute: 0 10*3/uL (ref 0.0–0.1)
Basophils Relative: 0 %
Eosinophils Absolute: 0 10*3/uL (ref 0.0–0.5)
Eosinophils Relative: 0 %
HCT: 21 % — ABNORMAL LOW (ref 36.0–46.0)
Hemoglobin: 6 g/dL — CL (ref 12.0–15.0)
Immature Granulocytes: 0 %
Lymphocytes Relative: 28 %
Lymphs Abs: 1 10*3/uL (ref 0.7–4.0)
MCH: 21.7 pg — ABNORMAL LOW (ref 26.0–34.0)
MCHC: 28.6 g/dL — ABNORMAL LOW (ref 30.0–36.0)
MCV: 75.8 fL — ABNORMAL LOW (ref 80.0–100.0)
Monocytes Absolute: 0.3 10*3/uL (ref 0.1–1.0)
Monocytes Relative: 7 %
Neutro Abs: 2.4 10*3/uL (ref 1.7–7.7)
Neutrophils Relative %: 65 %
Platelets: 328 10*3/uL (ref 150–400)
RBC: 2.77 MIL/uL — ABNORMAL LOW (ref 3.87–5.11)
RDW: 17.5 % — ABNORMAL HIGH (ref 11.5–15.5)
WBC: 3.7 10*3/uL — ABNORMAL LOW (ref 4.0–10.5)
nRBC: 0 % (ref 0.0–0.2)

## 2023-04-30 LAB — TROPONIN I (HIGH SENSITIVITY)
Troponin I (High Sensitivity): 4 ng/L (ref <18)
Troponin I (High Sensitivity): 4 ng/L (ref <18)

## 2023-04-30 LAB — CBC
HCT: 22 % — ABNORMAL LOW (ref 36.0–46.0)
Hemoglobin: 6 g/dL — CL (ref 12.0–15.0)
MCH: 21.8 pg — ABNORMAL LOW (ref 26.0–34.0)
MCHC: 27.3 g/dL — ABNORMAL LOW (ref 30.0–36.0)
MCV: 80 fL (ref 80.0–100.0)
Platelets: 342 10*3/uL (ref 150–400)
RBC: 2.75 MIL/uL — ABNORMAL LOW (ref 3.87–5.11)
RDW: 17.7 % — ABNORMAL HIGH (ref 11.5–15.5)
WBC: 4.2 10*3/uL (ref 4.0–10.5)
nRBC: 0 % (ref 0.0–0.2)

## 2023-04-30 LAB — COMPREHENSIVE METABOLIC PANEL
ALT: 14 U/L (ref 0–44)
ALT: 13 U/L (ref 0–44)
AST: 19 U/L (ref 15–41)
AST: 21 U/L (ref 15–41)
Albumin: 3.3 g/dL — ABNORMAL LOW (ref 3.5–5.0)
Albumin: 3.4 g/dL — ABNORMAL LOW (ref 3.5–5.0)
Alkaline Phosphatase: 68 U/L (ref 38–126)
Alkaline Phosphatase: 65 U/L (ref 38–126)
Anion gap: 8 (ref 5–15)
Anion gap: 10 (ref 5–15)
BUN: 8 mg/dL (ref 8–23)
BUN: 7 mg/dL — ABNORMAL LOW (ref 8–23)
CO2: 25 mmol/L (ref 22–32)
CO2: 21 mmol/L — ABNORMAL LOW (ref 22–32)
Calcium: 9.1 mg/dL (ref 8.9–10.3)
Calcium: 8.8 mg/dL — ABNORMAL LOW (ref 8.9–10.3)
Chloride: 103 mmol/L (ref 98–111)
Chloride: 105 mmol/L (ref 98–111)
Creatinine, Ser: 1.16 mg/dL — ABNORMAL HIGH (ref 0.44–1.00)
Creatinine, Ser: 1.09 mg/dL — ABNORMAL HIGH (ref 0.44–1.00)
GFR, Estimated: 53 mL/min — ABNORMAL LOW (ref >60)
GFR, Estimated: 57 mL/min — ABNORMAL LOW (ref >60)
Glucose, Bld: 106 mg/dL — ABNORMAL HIGH (ref 70–99)
Glucose, Bld: 98 mg/dL (ref 70–99)
Potassium: 3.2 mmol/L — ABNORMAL LOW (ref 3.5–5.1)
Potassium: 3.4 mmol/L — ABNORMAL LOW (ref 3.5–5.1)
Sodium: 136 mmol/L (ref 135–145)
Sodium: 136 mmol/L (ref 135–145)
Total Bilirubin: 0.5 mg/dL (ref 0.0–1.2)
Total Bilirubin: 0.3 mg/dL (ref 0.0–1.2)
Total Protein: 7.2 g/dL (ref 6.5–8.1)
Total Protein: 7.4 g/dL (ref 6.5–8.1)

## 2023-04-30 LAB — PREPARE RBC (CROSSMATCH)

## 2023-04-30 LAB — HEMOGLOBIN AND HEMATOCRIT, BLOOD
HCT: 22.1 % — ABNORMAL LOW (ref 36.0–46.0)
Hemoglobin: 6.7 g/dL — CL (ref 12.0–15.0)

## 2023-04-30 LAB — ABO/RH: ABO/RH(D): B POS

## 2023-04-30 LAB — FERRITIN: Ferritin: 2 ng/mL — ABNORMAL LOW (ref 11–307)

## 2023-04-30 LAB — GLUCOSE, CAPILLARY: Glucose-Capillary: 100 mg/dL — ABNORMAL HIGH (ref 70–99)

## 2023-04-30 LAB — BRAIN NATRIURETIC PEPTIDE: B Natriuretic Peptide: 4.6 pg/mL (ref 0.0–100.0)

## 2023-04-30 MED ORDER — SODIUM CHLORIDE 0.9% IV SOLUTION: Freq: Once | INTRAVENOUS | Status: AC

## 2023-04-30 MED ORDER — ATORVASTATIN CALCIUM 10 MG PO TABS
10.0000 mg | ORAL_TABLET | Freq: Every day | ORAL | Status: DC
Start: 2023-04-30 — End: 2023-05-02
  Administered 2023-04-30 – 2023-05-02 (×2): 10 mg via ORAL
  Filled 2023-04-30 (×2): qty 1

## 2023-04-30 MED ORDER — DICLOFENAC SODIUM 1 % EX GEL
2.0000 g | Freq: Every day | CUTANEOUS | Status: DC | PRN
Start: 2023-04-30 — End: 2023-05-02

## 2023-04-30 MED ORDER — SODIUM CHLORIDE 0.9% IV SOLUTION: Freq: Once | INTRAVENOUS | Status: DC

## 2023-04-30 MED ORDER — POTASSIUM CHLORIDE 20 MEQ PO PACK
40.0000 meq | PACK | Freq: Once | ORAL | Status: AC
  Administered 2023-04-30: 40 meq via ORAL
  Filled 2023-04-30: qty 2

## 2023-04-30 MED ORDER — SODIUM CHLORIDE 0.9% IV SOLUTION
Freq: Once | INTRAVENOUS | Status: DC
Start: 2023-04-30 — End: 2023-05-02

## 2023-04-30 MED ORDER — BICTEGRAVIR-EMTRICITAB-TENOFOV 50-200-25 MG PO TABS
1.0000 | ORAL_TABLET | Freq: Every day | ORAL | Status: DC
  Administered 2023-05-02: 1 via ORAL
  Filled 2023-04-30 (×3): qty 1

## 2023-04-30 MED ORDER — LIDOCAINE 5 % EX PTCH
1.0000 | MEDICATED_PATCH | Freq: Every day | CUTANEOUS | Status: DC | PRN
  Administered 2023-05-02: 1 via TRANSDERMAL
  Filled 2023-04-30: qty 1

## 2023-04-30 MED ORDER — PANTOPRAZOLE SODIUM 40 MG IV SOLR
40.0000 mg | Freq: Two times a day (BID) | INTRAVENOUS | Status: DC
  Administered 2023-04-30 (×2): 40 mg via INTRAVENOUS
  Filled 2023-04-30 (×2): qty 10

## 2023-04-30 NOTE — ED Triage Notes (Signed)
 Patient with intermittent fatigue, sob, black tarry stool x 2-3 months with constant symptoms x 3 days. Blood work today in the system shows Hgb 6.0. Patient on Xarelto.

## 2023-04-30 NOTE — Progress Notes (Signed)
    CC: Acute Concern of dizziness  HPI:  Vicki Brown is a 63 y.o. female with pertinent PMH of HTN, Afib, IDA who presents to the clinic with dizziness. Please see assessment and plan below for further details.  Review of Systems:   Pertinent items noted in HPI and/or A&P.  Physical Exam:  Vitals:   04/30/23 0958  BP: (!) 107/55  Pulse: 96  Temp: 98 F (36.7 C)  TempSrc: Oral  SpO2: 100%  Weight: 183 lb 3.2 oz (83.1 kg)  Height: 5\' 6"  (1.676 m)   Fatigued appearing female Regular heart rate and rhythm, no murmurs; no lower extremity edema Lungs clear to auscultation bilaterally, normal respiratory rate and effort Mild conjunctival and mucosal pallor Abdomen soft, nontender, nondistended Alert and oriented, no focal neurological deficits  Assessment & Plan:   Dizziness She presents to the clinic with several weeks of lightheadedness and dizziness, which has been progressive.  She now feels generalized weakness and lightheadedness constantly.  She also notes a headache and shortness of breath at rest that worsens with exertion.  She does note several months of intermittent blood in her stool, both with wiping and in the stool itself.  Per chart review, she had a colonoscopy in 2023 with a history of colon polyps and internal hemorrhoids.  She denies any infectious symptoms currently.  Her bowel movements have otherwise been normal.  No pain with defecation.  She does note some intermittent nausea but denies vomiting.  She has had a little bit of hearing loss in 1 year, but states that this has been chronic.  No dysuria, hematuria.  Of note, she also reports midsternal and left-sided chest pain for the past few weeks.  She states that this was intermittent but now occurs at rest and constantly.  The pain is difficult for her to characterize, but she describes it as having features of her prior acid reflux "but different ".  It does not radiate.  It does worsen with exertion.   There is no worsening on palpation.  She previously was on a PPI, which she stopped taking, so it is possible that this is related to her GERD, however it also could be cardiogenic.  I have ordered an ECG and a troponin.  In terms of her dizziness/lightheadedness, I am most concerned about symptomatic anemia given her active rectal bleeding and shortness of breath.  She is taking daily Xarelto for her atrial fibrillation.  She previously took oral iron for iron deficiency anemia, however stopped taking this recently as it was causing GI upset.   I will get some labs to evaluate for other possible causes of her symptoms including a CMP, TSH, iron panel, point-of-care glucose.  Update: Received a call from the lab with a critical hemoglobin of 6.0.  Iron panel shows significant iron deficiency.  TSH was normal.  EKG personally reviewed, normal sinus rhythm, no ischemic changes.  Point-of-care glucose normal.  I have sent her to the emergency department for further workup and a blood transfusion given her severe anemia.   Patient discussed with Dr. Jonita Albee, MD [facility] Internal Medicine Resident PGY-1 Clinic Phone: [phone] Pager: [phone]

## 2023-04-30 NOTE — H&P (Addendum)
 Date: 04/30/2023               Patient Name:  Vicki Brown MRN: [id number]  DOB: Mar 29, 1960 Age / Sex: 63 y.o., female   PCP: Philomena Doheny, MD         Medical Service: Internal Medicine Teaching Service         Attending Physician: Dr. Reymundo Poll, MD      First Contact: Dr. Monna Fam, MD Pager [phone]    Second Contact: Dr. Rudene Christians, DO Pager [phone]         After Hours (After 5p/  First Contact Pager: [phone]  weekends / holidays): Second Contact Pager: [phone]   SUBJECTIVE   Chief Complaint: fatigue, dyspnea  History of Present Illness:  Patient is a 63 yo woman with history of Afib on Eliquis, HTN, well-controlled HIV, presenting with fatigue and dyspnea. Patient reports worsening fatigue for approximately the last two months. She has grown more tired and dyspneic over the last several days, today she felt winded while sweeping the floor. She reports intermittent bloody and tarry black stools for the past 2-3 months. She has been feeling lightheaded and dizzy, has not passed out. She has intermittent episodes of substernal chest tightness that occur randomly and resolve spontaneously for the past several months. She's been having some nausea without vomiting. No constipation, diarrhea, dysuria.   Seen in the [facility] this am, CBC positive for Hgb 6.0 and iron panel showing significant iron deficiency, send to the ED   ED Course: HDS, ORA Hgb 6.0 CXR unremarkable EKG normal sinus rhythm, no ischemic changes  Past Medical History: Past Medical History:  Diagnosis Date   Allergy    HIV infection (HCC)    Hyperlipidemia    Hypertension    Insomnia 12/19/2014    Meds:  Current Outpatient Medications  Medication Instructions   atorvastatin (LIPITOR) 10 MG tablet TAKE 4 TABLETS EVERY DAY   Baclofen 5 mg, Oral, 2 times daily between meals and at bedtime PRN   bictegravir-emtricitabine-tenofovir AF (BIKTARVY) 50-200-25 MG TABS tablet 1 tablet,  Oral, Daily   diclofenac Sodium (VOLTAREN ARTHRITIS PAIN) 2 g, Topical, 4 times daily   lisinopril-hydrochlorothiazide (ZESTORETIC) 10-12.5 MG tablet 1 tablet, Oral, Daily   metoprolol succinate (TOPROL XL) 25 mg, Oral, Daily   omeprazole (PRILOSEC) 20 MG capsule Take 1 capsule (20 mg total) by mouth 2 (two) times daily before a meal for 28 days, THEN 1 capsule (20 mg total) daily for 28 days.   traZODone (DESYREL) 25 mg, Oral, Daily at bedtime   Vitamin D, Ergocalciferol, 50000 units CAPS 1 capsule, Oral, Weekly   Xarelto 20 mg, Oral, Daily with supper  Has not been taking Zestoretic due to nausea Trazodone prn  Past Surgical History:  Procedure Laterality Date   Broken Arm Left    Pins placed in arm   TUBAL LIGATION      Social:  Living Situation: home alone. Independent in ADLs/iADLs PCP: Colbert Coyer, Alyson Locket, MD Tobacco: none Alcohol: rare, none in last couple weeks Drugs: none  Family History: noncontributory  Allergies: Allergies as of 04/30/2023   (No Known Allergies)    Review of Systems: A complete ROS was negative except as per HPI.   OBJECTIVE:   Physical Exam: Blood pressure 100/64, pulse 76, temperature 99 F (37.2 C), temperature source Oral, resp. rate 16, SpO2 100%.  Constitutional: well-appearing, no acute distress Cardiovascular: RRR Pulmonary/Chest: CTAB Abdominal: soft, nontender, nondistended Neurological: A&O x 3  Labs: CBC    Component Value Date/Time   WBC 4.2 04/30/2023 1300   RBC 2.75 (L) 04/30/2023 1300   HGB 6.0 (LL) 04/30/2023 1300   HGB 12.3 03/16/2022 1001   HCT 22.0 (L) 04/30/2023 1300   HCT 38.3 03/16/2022 1001   PLT 342 04/30/2023 1300   PLT 243 03/16/2022 1001   MCV 80.0 04/30/2023 1300   MCV 99 (H) 03/16/2022 1001   MCH 21.8 (L) 04/30/2023 1300   MCHC 27.3 (L) 04/30/2023 1300   RDW 17.7 (H) 04/30/2023 1300   RDW 12.4 03/16/2022 1001   LYMPHSABS 1.0 04/30/2023 1056   LYMPHSABS 1.4 03/16/2022 1001   MONOABS 0.3  04/30/2023 1056   EOSABS 0.0 04/30/2023 1056   EOSABS 0.1 03/16/2022 1001   BASOSABS 0.0 04/30/2023 1056   BASOSABS 0.0 03/16/2022 1001     CMP     Component Value Date/Time   NA 136 04/30/2023 1300   NA 140 06/05/2021 1526   K 3.4 (L) 04/30/2023 1300   CL 105 04/30/2023 1300   CO2 21 (L) 04/30/2023 1300   GLUCOSE 98 04/30/2023 1300   BUN 7 (L) 04/30/2023 1300   BUN 10 06/05/2021 1526   CREATININE 1.09 (H) 04/30/2023 1300   CREATININE 1.04 11/16/2022 1428   CALCIUM 8.8 (L) 04/30/2023 1300   PROT 7.4 04/30/2023 1300   PROT 7.4 06/05/2021 1526   ALBUMIN 3.4 (L) 04/30/2023 1300   ALBUMIN 4.4 06/05/2021 1526   AST 21 04/30/2023 1300   ALT 13 04/30/2023 1300   ALKPHOS 65 04/30/2023 1300   BILITOT 0.3 04/30/2023 1300   BILITOT 0.2 06/05/2021 1526   GFRNONAA 57 (L) 04/30/2023 1300   GFRNONAA 54 (L) 07/12/2020 1211   GFRAA 63 07/12/2020 1211    Imaging: No results found.  ASSESSMENT & PLAN:   Vicki Brown is a 63 y.o. person living with a history of Afib on Xarelto, HIV, IDA who presented with weakness and admitted for anemia on hospital day 0  Microcytic anemia Hematochezia Hgb 6.0 in the ED. Not having symptoms while lying in bed. Endorses intermittent episodes of BRBPR and melenic stools. She did not take her Xarelto this morning. No indication to reverse anticoagulation as there is no active bleeding. GI consulted in the ED, appreciate assistance - 1 unit pRBCs ordered - NPO at midnight for EGD tomorrow - IV pantoprazole 40mg  BID - f/u post transfusion HH, followed by q6h HH - Hold Xarelto  Noncardiac chest pain Ischemic workup including EKG and troponins reassuring. CXR negative. History does not support anginal-type chest pain. Low suspicion for ACS. Continue to monitor, supportive care for chest pain.   Hypokalemia K 3.4, repleted with oral KCl - Trend BMP  HTN Blood pressure low/normal. Orthostatics negative.  - Hold home  antihypertensives  HIV Well-controlled on Biktarvy - Continue home therapy  Atrial fibrillation EKG showing normal sinus rhythm. Holding home Xarelto and metoprolol in setting of GI bleed and soft BP  HLD Continue home atorvastatin  Diet: Regular, NPO at midnight VTE: SCDs Code: Full  Prior to Admission Living Arrangement: home alone Anticipated Discharge Location: pending PT/OT eval Barriers to Discharge: pending medical stability  Signed: Monna Fam, MD Internal Medicine Resident PGY-1  04/30/2023, 3:24 PM

## 2023-04-30 NOTE — ED Provider Notes (Signed)
 [place] EMERGENCY DEPARTMENT AT [facility] Provider Note   CSN: [id number] Arrival date & time: 04/30/23  1212     History  Chief Complaint  Patient presents with   Abnormal Lab    Vicki Brown is a 63 y.o. female.  The history is provided by the patient and medical records.  Abnormal Lab Time since result:  Anemia Patient referred by:  PCP Resulting agency:  Internal Result type: hematology   Hematology:    Hemoglobin:  Low      Home Medications Prior to Admission medications   Medication Sig Start Date End Date Taking? Authorizing Provider  atorvastatin (LIPITOR) 10 MG tablet TAKE 4 TABLETS EVERY DAY 04/15/23   Blanchard Kelch, NP  baclofen 5 MG TABS Take 1 tablet (5 mg total) by mouth 3 times/day as needed-between meals & bedtime for muscle spasms. Patient not taking: Reported on 11/16/2022 09/27/22   Doreene Eland, MD  bictegravir-emtricitabine-tenofovir AF (BIKTARVY) 50-200-25 MG TABS tablet Take 1 tablet by mouth daily. 11/16/22   Veryl Speak, FNP  diclofenac Sodium (VOLTAREN ARTHRITIS PAIN) 1 % GEL Apply 2 g topically 4 (four) times daily. 11/01/22   Rising, Lurena Joiner, PA-C  lisinopril-hydrochlorothiazide (ZESTORETIC) 10-12.5 MG tablet Take 1 tablet by mouth daily. 07/16/22   Evlyn Kanner, MD  metoprolol succinate (TOPROL XL) 25 MG 24 hr tablet Take 1 tablet (25 mg total) by mouth daily. 06/21/19   Lyn Records, MD  omeprazole (PRILOSEC) 20 MG capsule Take 1 capsule (20 mg total) by mouth 2 (two) times daily before a meal for 28 days, THEN 1 capsule (20 mg total) daily for 28 days. 07/12/20 07/09/22  Blanchard Kelch, NP  rivaroxaban (XARELTO) 20 MG TABS tablet TAKE 1 TABLET BY MOUTH DAILY WITH SUPPER. 04/15/23   Joylene Grapes, NP  traZODone (DESYREL) 50 MG tablet Take 0.5 tablets (25 mg total) by mouth at bedtime. 10/24/19   Ginnie Smart, MD  Vitamin D, Ergocalciferol, 50000 units CAPS Take 1 capsule by mouth once a week. 07/22/20   Blanchard Kelch, NP      Allergies    Patient has no known allergies.    Review of Systems   Review of Systems  Constitutional:  Positive for fatigue. Negative for chills and fever.  HENT:  Negative for congestion.   Respiratory:  Positive for chest tightness and shortness of breath. Negative for cough and wheezing.   Cardiovascular:  Positive for chest pain. Negative for palpitations and leg swelling.  Gastrointestinal:  Positive for blood in stool and nausea. Negative for abdominal pain, constipation, diarrhea and vomiting.  Genitourinary:  Negative for dysuria.  Musculoskeletal:  Negative for back pain, neck pain and neck stiffness.  Skin:  Negative for rash and wound.  Neurological:  Positive for light-headedness. Negative for headaches.  Psychiatric/Behavioral:  Negative for agitation and confusion.   All other systems reviewed and are negative.   Physical Exam Updated Vital Signs BP 100/64   Pulse 76   Temp 99 F (37.2 C) (Oral)   Resp 16   SpO2 100%  Physical Exam Vitals and nursing note reviewed.  Constitutional:      General: She is not in acute distress.    Appearance: She is well-developed. She is not ill-appearing, toxic-appearing or diaphoretic.  HENT:     Head: Normocephalic and atraumatic.     Nose: No congestion or rhinorrhea.     Mouth/Throat:     Mouth: Mucous membranes  are moist.     Pharynx: No oropharyngeal exudate or posterior oropharyngeal erythema.  Eyes:     Extraocular Movements: Extraocular movements intact.     Conjunctiva/sclera: Conjunctivae normal.     Pupils: Pupils are equal, round, and reactive to light.  Cardiovascular:     Rate and Rhythm: Normal rate and regular rhythm.     Pulses: Normal pulses.     Heart sounds: No murmur heard. Pulmonary:     Effort: Pulmonary effort is normal. No respiratory distress.     Breath sounds: Normal breath sounds. No wheezing or rhonchi.  Chest:     Chest wall: No tenderness.  Abdominal:      General: Abdomen is flat.     Palpations: Abdomen is soft.     Tenderness: There is no abdominal tenderness. There is no guarding or rebound.  Musculoskeletal:        General: No swelling or tenderness.     Cervical back: Neck supple. No tenderness.  Skin:    General: Skin is warm and dry.     Capillary Refill: Capillary refill takes less than 2 seconds.     Findings: No erythema or rash.  Neurological:     General: No focal deficit present.     Mental Status: She is alert.  Psychiatric:        Mood and Affect: Mood normal.     ED Results / Procedures / Treatments   Labs (all labs ordered are listed, but only abnormal results are displayed) Labs Reviewed  COMPREHENSIVE METABOLIC PANEL - Abnormal; Notable for the following components:      Result Value   Potassium 3.4 (*)    CO2 21 (*)    BUN 7 (*)    Creatinine, Ser 1.09 (*)    Calcium 8.8 (*)    Albumin 3.4 (*)    GFR, Estimated 57 (*)    All other components within normal limits  CBC - Abnormal; Notable for the following components:   RBC 2.75 (*)    Hemoglobin 6.0 (*)    HCT 22.0 (*)    MCH 21.8 (*)    MCHC 27.3 (*)    RDW 17.7 (*)    All other components within normal limits  BRAIN NATRIURETIC PEPTIDE  POC OCCULT BLOOD, ED  TYPE AND SCREEN  PREPARE RBC (CROSSMATCH)  ABO/RH  TROPONIN I (HIGH SENSITIVITY)    EKG None  Radiology No results found.  Procedures Procedures    CRITICAL CARE Performed by: [name] Total critical care time: 35 minutes Critical care time was exclusive of separately billable procedures and treating other patients. Critical care was necessary to treat or prevent imminent or life-threatening deterioration. Critical care was time spent personally by me on the following activities: development of treatment plan with patient and/or surrogate as well as nursing, discussions with consultants, evaluation of patient's response to treatment, examination of patient, obtaining  history from patient or surrogate, ordering and performing treatments and interventions, ordering and review of laboratory studies, ordering and review of radiographic studies, pulse oximetry and re-evaluation of patient's condition.  Medications Ordered in ED Medications  0.9 %  sodium chloride infusion (Manually program via Guardrails IV Fluids) (has no administration in time range)    ED Course/ Medical Decision Making/ A&P                                 Medical Decision  Making Amount and/or Complexity of Data Reviewed Labs: ordered. Radiology: ordered.  Risk Prescription drug management. Decision regarding hospitalization.    Vicki Brown is a 63 y.o. female with a past medical history significant for HIV, hypertension, dyslipidemia, atrial fibrillation on Xarelto, and previous iron deficiency anemia who presents from clinic for further evaluation and management of symptomatic anemia in the setting of GI bleed on blood thinners.  According to patient, for the last few weeks and months she has had dark tarry stools intermittently.  She reports that she is on blood thinners for her A-fib.  She reports that she has had some fatigue and exertional lightheadedness and chest tightness but has had some more persistent shortness of breath recently.  She went to clinic and was found have hemoglobin 6.0 when it was most recently 11.9 before.  She reports no abdominal pain and denies any nausea or vomiting at this time.  Mild nausea earlier that has resolved.  Denies any urinary changes and denies any vaginal bleeding.  Denies other complaints at this time.  Otherwise denies fevers, chills, congestion, or cough.  On exam, lungs were clear chest nontender.  Abdomen nontender.  Mucous membranes were somewhat pale but she has dark complexion.  Good pulses in extremities.  Patient resting comfortably.  She is not hypotensive on my evaluation.  Will call [facility] whom she is seen in the past to  let them know she needs to be admitted for further workup for suspected GI bleed.  Will order some blood for her and call internal medicine teaching service for admission.  Will get a BNP added on as well as a chest x-ray given her shortness of breath.  Her first troponin in clinic was normal, will trend 1 more time.  She will be admitted for further management of symptomatic anemia from a GI bleed while on blood thinners.  As the hemoglobin seems to be stable at 6.0 from 6.0 several hours ago, will defer to admitting team and GI about reversal of anticoagulation as it seems stable today.  She will be admitted for further management.   2:46 PM Spoke to [facility] team and they will come see the patient during the admission.         Final Clinical Impression(s) / ED Diagnoses Final diagnoses:  Symptomatic anemia  Gastrointestinal hemorrhage, unspecified gastrointestinal hemorrhage type  Exertional chest pain  Shortness of breath     Clinical Impression: 1. Symptomatic anemia   2. Gastrointestinal hemorrhage, unspecified gastrointestinal hemorrhage type   3. Exertional chest pain   4. Shortness of breath     Disposition: Admit  This note was prepared with assistance of Dragon voice recognition software. Occasional wrong-word or sound-a-like substitutions may have occurred due to the inherent limitations of voice recognition software.      Kayston Jodoin, Canary Brim, MD 04/30/23 [phone]

## 2023-04-30 NOTE — Progress Notes (Addendum)
 Date and time results received: 04/30/23 2250  Test: hemoglobin   Critical Value: 6.7  Name of Provider Notified: Katheran James, DO  Orders received for 1 unit PRBC

## 2023-04-30 NOTE — H&P (View-Only) (Signed)
 Referring Provider: Dr. Cristal Deer Tegeler Primary Care Physician:  Colbert Coyer, Alyson Locket, MD Primary Gastroenterologist:  Dr. Amada Jupiter  Reason for Consultation: Anemia, black stools  HPI: Vicki Brown is a 63 y.o. female with a past medical history of hypertension, hyperlipidemia, fibrillation on Xarelto HIV positive on Biktarvy and colon polyps.  She was seen by her PCP due to having intermittent fatigue, shortness of breath and black tarry stools for the past 2 to 3 months.  Hemoglobin level reported at 6.0 and she was sent directly to the ED for further evaluation.  She is on Xarelto for atrial fibrillation.  Labs in the ED showed a hemoglobin level of 6.0.  Hematocrit 22.  Platelet 342.  Potassium 3.4.  BUN 7.  Creatinine 1.09.  Albumin 3.4.  Normal LFTs.  FOBT collected, results pending.  One unit of PRBCs ordered, not yet transfused.  PPI IV twice daily ordered, patient receiving first dose now.   She endorses feeling significantly fatigued and lightheaded with shortness of breath on and off over the past 2 months. She has intermittent nausea without vomiting. She has heartburn daily x 4 months for which she intermittently takes Alka-Seltzer as needed.  No dysphagia.  No upper or lower abdominal pain.  She intermittently passes a black loose stool approximately once weekly for the past 2 to 3 months, last episode was 1-1/2 weeks ago.  No bright red blood per the rectum.  No Pepto-Bismol or oral iron use.  No NSAID use.  Occasional alcohol use use, not on a regular basis.  Non-smoker.  No history of upper GI bleed.  Never had an EGD.  She is on Xarelto for A-fib, last dose was taken yesterday around 12 PM.  Last colonoscopy was 11/18/2021 which showed diverticulosis and internal hemorrhoids.  Prior colonoscopy 08/2016 identified 1 sessile serrated polyp removed from the colon.  No known family history of esophageal, gastric or colorectal cancer.  GI PROCEDURES:  Colonoscopy 11/18/2021  by Dr. Myrtie Neither: - Diverticulosis in the left colon and in the right colon.  - Internal hemorrhoids.  - The examination was otherwise normal on direct and retroflexion views.  - No specimens collected.  Colonoscopy 08/31/2016: - Decreased sphincter tone found on digital rectal exam.  - One 6 mm polyp in the mid transverse colon, removed with a cold snare. Resected and retrieved.  - The examination was otherwise normal on direct and retroflexion views - SESSILE SERRATED POLYP (FIVE FRAGMENTS). - NO HIGH GRADE DYSPLASIA OR MALIGNANCY  ECHO 07/05/2019: 1. Left ventricular ejection fraction, by estimation, is 60 to 65%. The left ventricle has normal function. The left ventricle has no regional wall motion abnormalities. Left ventricular diastolic parameters were normal. 2. Right ventricular systolic function is normal. The right ventricular size is normal. There is normal pulmonary artery systolic pressure. 3. The mitral valve is normal in structure. Trivial mitral valve regurgitation. No evidence of mitral stenosis. 4. The aortic valve is tricuspid. Aortic valve regurgitation is trivial. Mild aortic valve sclerosis is present, with no evidence of aortic valve stenosis.  5. The inferior vena cava is normal in size with greater than 50% respiratory variability, suggesting right atrial pressure of 3 mmHg   Past Medical History:  Diagnosis Date   Allergy    HIV infection (HCC)    Hyperlipidemia    Hypertension    Insomnia 12/19/2014    Past Surgical History:  Procedure Laterality Date   Broken Arm Left    Pins placed in  arm   TUBAL LIGATION      Prior to Admission medications   Medication Sig Start Date End Date Taking? Authorizing Provider  atorvastatin (LIPITOR) 10 MG tablet TAKE 4 TABLETS EVERY DAY 04/15/23   Blanchard Kelch, NP  baclofen 5 MG TABS Take 1 tablet (5 mg total) by mouth 3 times/day as needed-between meals & bedtime for muscle spasms. Patient not taking: Reported on  11/16/2022 09/27/22   Doreene Eland, MD  bictegravir-emtricitabine-tenofovir AF (BIKTARVY) 50-200-25 MG TABS tablet Take 1 tablet by mouth daily. 11/16/22   Veryl Speak, FNP  diclofenac Sodium (VOLTAREN ARTHRITIS PAIN) 1 % GEL Apply 2 g topically 4 (four) times daily. 11/01/22   Rising, Lurena Joiner, PA-C  lisinopril-hydrochlorothiazide (ZESTORETIC) 10-12.5 MG tablet Take 1 tablet by mouth daily. 07/16/22   Evlyn Kanner, MD  metoprolol succinate (TOPROL XL) 25 MG 24 hr tablet Take 1 tablet (25 mg total) by mouth daily. 06/21/19   Lyn Records, MD  omeprazole (PRILOSEC) 20 MG capsule Take 1 capsule (20 mg total) by mouth 2 (two) times daily before a meal for 28 days, THEN 1 capsule (20 mg total) daily for 28 days. 07/12/20 07/09/22  Blanchard Kelch, NP  rivaroxaban (XARELTO) 20 MG TABS tablet TAKE 1 TABLET BY MOUTH DAILY WITH SUPPER. 04/15/23   Joylene Grapes, NP  traZODone (DESYREL) 50 MG tablet Take 0.5 tablets (25 mg total) by mouth at bedtime. 10/24/19   Ginnie Smart, MD  Vitamin D, Ergocalciferol, 50000 units CAPS Take 1 capsule by mouth once a week. 07/22/20   Blanchard Kelch, NP    Current Facility-Administered Medications  Medication Dose Route Frequency Provider Last Rate Last Admin   0.9 %  sodium chloride infusion (Manually program via Guardrails IV Fluids)   Intravenous Once Tegeler, Canary Brim, MD       Current Outpatient Medications  Medication Sig Dispense Refill   atorvastatin (LIPITOR) 10 MG tablet TAKE 4 TABLETS EVERY DAY 30 tablet 1   baclofen 5 MG TABS Take 1 tablet (5 mg total) by mouth 3 times/day as needed-between meals & bedtime for muscle spasms. (Patient not taking: Reported on 11/16/2022) 15 tablet 0   bictegravir-emtricitabine-tenofovir AF (BIKTARVY) 50-200-25 MG TABS tablet Take 1 tablet by mouth daily. 30 tablet 5   diclofenac Sodium (VOLTAREN ARTHRITIS PAIN) 1 % GEL Apply 2 g topically 4 (four) times daily. 50 g 0   lisinopril-hydrochlorothiazide  (ZESTORETIC) 10-12.5 MG tablet Take 1 tablet by mouth daily. 90 tablet 3   metoprolol succinate (TOPROL XL) 25 MG 24 hr tablet Take 1 tablet (25 mg total) by mouth daily. 90 tablet 3   omeprazole (PRILOSEC) 20 MG capsule Take 1 capsule (20 mg total) by mouth 2 (two) times daily before a meal for 28 days, THEN 1 capsule (20 mg total) daily for 28 days. 84 capsule 0   rivaroxaban (XARELTO) 20 MG TABS tablet TAKE 1 TABLET BY MOUTH DAILY WITH SUPPER. 90 tablet 1   traZODone (DESYREL) 50 MG tablet Take 0.5 tablets (25 mg total) by mouth at bedtime. 30 tablet 3   Vitamin D, Ergocalciferol, 50000 units CAPS Take 1 capsule by mouth once a week. 8 capsule 0    Allergies as of 04/30/2023   (No Known Allergies)    Family History  Problem Relation Age of Onset   Cancer Mother        uterine vs ovarian?   Seizures Father    Mental illness Brother  Breast cancer Maternal Aunt    Diabetes Daughter    Colon polyps Neg Hx    Esophageal cancer Neg Hx    Rectal cancer Neg Hx    Stomach cancer Neg Hx    Thyroid disease Neg Hx     Social History   Socioeconomic History   Marital status: Single    Spouse name: Not on file   Number of children: Not on file   Years of education: Not on file   Highest education level: Not on file  Occupational History   Not on file  Tobacco Use   Smoking status: Former    Current packs/day: 0.00    Average packs/day: 2.0 packs/day for 20.0 years (40.0 ttl pk-yrs)    Types: Cigarettes    Start date: 68    Quit date: 2004    Years since quitting: 21.1   Smokeless tobacco: Never   Tobacco comments:    20 years ago  Vaping Use   Vaping status: Never Used  Substance and Sexual Activity   Alcohol use: Not Currently    Alcohol/week: 6.0 standard drinks of alcohol    Types: 6 Cans of beer per week   Drug use: Yes    Frequency: 0.5 times per week    Types: Marijuana   Sexual activity: Not Currently    Partners: Male    Birth control/protection: Condom     Comment: declined condoms 05/11/22  Other Topics Concern   Not on file  Social History Narrative   Not on file   Social Drivers of Health   Financial Resource Strain: Not on file  Food Insecurity: Not on file  Transportation Needs: Not on file  Physical Activity: Not on file  Stress: Not on file  Social Connections: Not on file  Intimate Partner Violence: Not on file   Review of Systems: Gen: Denies fever, sweats or chills. No weight loss.  CV: Denies chest pain, palpitations or edema. Resp: Denies cough, shortness of breath of hemoptysis.  GI:See HPI.    GU : Denies urinary burning, blood in urine, increased urinary frequency or incontinence. MS: Denies joint pain, muscles aches or weakness. Derm: Denies rash, itchiness, skin lesions or unhealing ulcers. Psych: Denies depression, anxiety, memory loss or confusion. Heme: Denies easy bruising, bleeding. Neuro:  Denies headaches, dizziness or paresthesias. Endo:  Denies any problems with DM, thyroid or adrenal function.  Physical Exam: Vital signs in last 24 hours: Temp:  [98 F (36.7 C)-99 F (37.2 C)] 99 F (37.2 C) (02/21 1255) Pulse Rate:  [76-96] 76 (02/21 1255) Resp:  [16] 16 (02/21 1255) BP: (100-107)/(55-64) 100/64 (02/21 1255) SpO2:  [100 %] 100 % (02/21 1255) Weight:  [83.1 kg] 83.1 kg (02/21 0958)   General: Alert 63 year old female in no acute distress. Head:  Normocephalic and atraumatic. Eyes:  No scleral icterus. Conjunctiva pink. Ears:  Normal auditory acuity. Nose:  No deformity, discharge or lesions. Mouth: Absent upper Titian.  No ulcers or lesions.  Neck:  Supple. No lymphadenopathy or thyromegaly.  Lungs: Breath sounds clear throughout. No wheezes, rhonchi or crackles.  Heart: Regular rhythm, no murmur. Abdomen: Soft, nondistended.  Nontender.  Positive bowel sounds to all 4 quadrants.  No hepatosplenomegaly.  No palpable mass.  No bruit. Rectal: FOBT pending. Musculoskeletal:  Symmetrical without  gross deformities.  Pulses:  Normal pulses noted. Extremities:  Without clubbing or edema. Neurologic:  Alert and  oriented x 4. No focal deficits.  Skin:  Intact without significant  lesions or rashes. Psych:  Alert and cooperative. Normal mood and affect.  Intake/Output from previous day: No intake/output data recorded. Intake/Output this shift: No intake/output data recorded.  Lab Results: Recent Labs    04/30/23 1056 04/30/23 1300  WBC 3.7* 4.2  HGB 6.0* 6.0*  HCT 21.0* 22.0*  PLT 328 342   BMET Recent Labs    04/30/23 1056 04/30/23 1300  NA 136 136  K 3.2* 3.4*  CL 103 105  CO2 25 21*  GLUCOSE 106* 98  BUN 8 7*  CREATININE 1.16* 1.09*  CALCIUM 9.1 8.8*   LFT Recent Labs    04/30/23 1300  PROT 7.4  ALBUMIN 3.4*  AST 21  ALT 13  ALKPHOS 65  BILITOT 0.3   PT/INR No results for input(s): "LABPROT", "INR" in the last 72 hours. Hepatitis Panel No results for input(s): "HEPBSAG", "HCVAB", "HEPAIGM", "HEPBIGM" in the last 72 hours.    Studies/Results: No results found.  IMPRESSION/PLAN:  63 year old female presents with symptomatic anemia and intermittent black stools for the past 2 months.  Hemoglobin 6.  One unit of PRBCs ordered, not yet transfused.  Hemodynamically stable.  No active melena/GI bleeding since arriving to the ED. -Regular diet  -NPO after midnight -EGD benefits and risks discussed including risk with sedation, risk of bleeding, perforation and infection -Pantoprazole 40 mg IV twice daily -Check H&H posttransfusion then Q 6 hours then  -Continue to hold Xarelto  GERD -See plan above  Atrial fibrillation on Xarelto, last dose was 04/29/2023 at 12 PM -Continue to hold Xarelto  History of a sessile serrated polyp per colonoscopy 08/2016.  Colonoscopy 11/2021 showed diverticulosis, internal hemorrhoids and no polyps.  HIV positive on Biktarvy     Arnaldo Natal  04/30/2023, 3:35 PM

## 2023-04-30 NOTE — Assessment & Plan Note (Addendum)
 She presents to the clinic with several weeks of lightheadedness and dizziness, which has been progressive.  She now feels generalized weakness and lightheadedness constantly.  She also notes a headache and shortness of breath at rest that worsens with exertion.  She does note several months of intermittent blood in her stool, both with wiping and in the stool itself.  Per chart review, she had a colonoscopy in 2023 with a history of colon polyps and internal hemorrhoids.  She denies any infectious symptoms currently.  Her bowel movements have otherwise been normal.  No pain with defecation.  She does note some intermittent nausea but denies vomiting.  She has had a little bit of hearing loss in 1 year, but states that this has been chronic.  No dysuria, hematuria.  Of note, she also reports midsternal and left-sided chest pain for the past few weeks.  She states that this was intermittent but now occurs at rest and constantly.  The pain is difficult for her to characterize, but she describes it as having features of her prior acid reflux "but different ".  It does not radiate.  It does worsen with exertion.  There is no worsening on palpation.  She previously was on a PPI, which she stopped taking, so it is possible that this is related to her GERD, however it also could be cardiogenic.  I have ordered an ECG and a troponin.  In terms of her dizziness/lightheadedness, I am most concerned about symptomatic anemia given her active rectal bleeding and shortness of breath.  She is taking daily Xarelto for her atrial fibrillation.  She previously took oral iron for iron deficiency anemia, however stopped taking this recently as it was causing GI upset.   I will get some labs to evaluate for other possible causes of her symptoms including a CMP, TSH, iron panel, point-of-care glucose.  Update: Received a call from the lab with a critical hemoglobin of 6.0.  Iron panel shows significant iron deficiency.  TSH was  normal.  EKG personally reviewed, normal sinus rhythm, no ischemic changes.  Point-of-care glucose normal.  I have sent her to the emergency department for further workup and a blood transfusion given her severe anemia.

## 2023-04-30 NOTE — Hospital Course (Addendum)
 Symptomatic anemia Iron deficiency Duodenal ulcer Patient presented to Internal medicine clinic with several weeks of progressive fatigue and dyspnea. She had episodes of dark tarry stools and bright red blood over the last few weeks. Hemoglobin in clinic at 6. Iron studies showed iron deficit of 1700. She was transfused 2 units of PRBCs with adequate response. No additional episodes of blood while in hospital. [facility] completed EGD that showed esophageal stenosis, large hiatal hernia, gastritis, and duodenal erosions without bleeding. Repeat hemoglobin was stable and patient was discharged with instructions to restart DOAC, start PPI BID, PO iron and restart regular diet. She received one infusion of IV iron, deficit remains around 1000 mg.  Esophageal stenosis Dysphagia She will need repeat EGD to evaluated ulcers. If healed then may get esophageal dilation. She confirms several month history of dysphagia.  Hypokalemia Repleted during admission.  Atrial fibrillation Xarelto restarted after hemoglobin stable following EGD.   Hypertension Blood pressure was normotensive but on lower side. Medications were held include lisinopril-hydrochlorothiazide and beta-blocker. Beta blocker was restarted at discharge. She will follow-up with clinic for guidance on restarting ACEI/ hydrochlorothiazide.  HIV Follows regularly for this. Adherent with biktarvy.  HLD Statin continued at discharge.

## 2023-04-30 NOTE — Anesthesia Preprocedure Evaluation (Addendum)
 Anesthesia Evaluation  Patient identified by MRN, date of birth, ID band Patient awake    Reviewed: Allergy & Precautions, NPO status , Patient's Chart, lab work & pertinent test results, reviewed documented beta blocker date and time   Airway Mallampati: III  TM Distance: >3 FB Neck ROM: Full    Dental  (+) Edentulous Upper, Dental Advisory Given, Missing   Pulmonary former smoker   Pulmonary exam normal breath sounds clear to auscultation       Cardiovascular hypertension, Pt. on medications and Pt. on home beta blockers + dysrhythmias Atrial Fibrillation  Rhythm:Regular Rate:Normal  Echo 2021 1. Left ventricular ejection fraction, by estimation, is 60 to 65%. The left ventricle has normal function. The left ventricle has no regional wall motion abnormalities. Left ventricular diastolic parameters were normal.   2. Right ventricular systolic function is normal. The right ventricular size is normal. There is normal pulmonary artery systolic pressure.   3. The mitral valve is normal in structure. Trivial mitral valve regurgitation. No evidence of mitral stenosis.   4. The aortic valve is tricuspid. Aortic valve regurgitation is trivial. Mild aortic valve sclerosis is present, with no evidence of aortic valve stenosis.   5. The inferior vena cava is normal in size with greater than 50% respiratory variability, suggesting right atrial pressure of 3 mmHg.     Neuro/Psych  Neuromuscular disease    GI/Hepatic negative GI ROS, Neg liver ROS,,,  Endo/Other   Hyperthyroidism   Renal/GU negative Renal ROS     Musculoskeletal negative musculoskeletal ROS (+)    Abdominal   Peds  Hematology  (+) Blood dyscrasia, anemia   Anesthesia Other Findings   Reproductive/Obstetrics                             Anesthesia Physical Anesthesia Plan  ASA: 3  Anesthesia Plan: MAC   Post-op Pain Management: Minimal  or no pain anticipated   Induction: Intravenous  PONV Risk Score and Plan: 2 and Propofol infusion, TIVA and Treatment may vary due to age or medical condition  Airway Management Planned: Natural Airway  Additional Equipment:   Intra-op Plan:   Post-operative Plan:   Informed Consent: I have reviewed the patients History and Physical, chart, labs and discussed the procedure including the risks, benefits and alternatives for the proposed anesthesia with the patient or authorized representative who has indicated his/her understanding and acceptance.     Dental advisory given  Plan Discussed with: CRNA  Anesthesia Plan Comments:         Anesthesia Quick Evaluation

## 2023-04-30 NOTE — Progress Notes (Signed)
 Admissions paged upon pt arrival on unit and Internal medicine MD. Awaiting orders.

## 2023-04-30 NOTE — Consult Note (Addendum)
 Referring Provider: Dr. Cristal Deer Tegeler Primary Care Physician:  Colbert Coyer, Alyson Locket, MD Primary Gastroenterologist:  Dr. Amada Jupiter  Reason for Consultation: Anemia, black stools  HPI: Vicki Brown is a 63 y.o. female with a past medical history of hypertension, hyperlipidemia, fibrillation on Xarelto HIV positive on Biktarvy and colon polyps.  She was seen by her PCP due to having intermittent fatigue, shortness of breath and black tarry stools for the past 2 to 3 months.  Hemoglobin level reported at 6.0 and she was sent directly to the ED for further evaluation.  She is on Xarelto for atrial fibrillation.  Labs in the ED showed a hemoglobin level of 6.0.  Hematocrit 22.  Platelet 342.  Potassium 3.4.  BUN 7.  Creatinine 1.09.  Albumin 3.4.  Normal LFTs.  FOBT collected, results pending.  One unit of PRBCs ordered, not yet transfused.  PPI IV twice daily ordered, patient receiving first dose now.   She endorses feeling significantly fatigued and lightheaded with shortness of breath on and off over the past 2 months. She has intermittent nausea without vomiting. She has heartburn daily x 4 months for which she intermittently takes Alka-Seltzer as needed.  No dysphagia.  No upper or lower abdominal pain.  She intermittently passes a black loose stool approximately once weekly for the past 2 to 3 months, last episode was 1-1/2 weeks ago.  No bright red blood per the rectum.  No Pepto-Bismol or oral iron use.  No NSAID use.  Occasional alcohol use use, not on a regular basis.  Non-smoker.  No history of upper GI bleed.  Never had an EGD.  She is on Xarelto for A-fib, last dose was taken yesterday around 12 PM.  Last colonoscopy was 11/18/2021 which showed diverticulosis and internal hemorrhoids.  Prior colonoscopy 08/2016 identified 1 sessile serrated polyp removed from the colon.  No known family history of esophageal, gastric or colorectal cancer.  GI PROCEDURES:  Colonoscopy 11/18/2021  by Dr. Myrtie Neither: - Diverticulosis in the left colon and in the right colon.  - Internal hemorrhoids.  - The examination was otherwise normal on direct and retroflexion views.  - No specimens collected.  Colonoscopy 08/31/2016: - Decreased sphincter tone found on digital rectal exam.  - One 6 mm polyp in the mid transverse colon, removed with a cold snare. Resected and retrieved.  - The examination was otherwise normal on direct and retroflexion views - SESSILE SERRATED POLYP (FIVE FRAGMENTS). - NO HIGH GRADE DYSPLASIA OR MALIGNANCY  ECHO 07/05/2019: 1. Left ventricular ejection fraction, by estimation, is 60 to 65%. The left ventricle has normal function. The left ventricle has no regional wall motion abnormalities. Left ventricular diastolic parameters were normal. 2. Right ventricular systolic function is normal. The right ventricular size is normal. There is normal pulmonary artery systolic pressure. 3. The mitral valve is normal in structure. Trivial mitral valve regurgitation. No evidence of mitral stenosis. 4. The aortic valve is tricuspid. Aortic valve regurgitation is trivial. Mild aortic valve sclerosis is present, with no evidence of aortic valve stenosis.  5. The inferior vena cava is normal in size with greater than 50% respiratory variability, suggesting right atrial pressure of 3 mmHg   Past Medical History:  Diagnosis Date   Allergy    HIV infection (HCC)    Hyperlipidemia    Hypertension    Insomnia 12/19/2014    Past Surgical History:  Procedure Laterality Date   Broken Arm Left    Pins placed in  arm   TUBAL LIGATION      Prior to Admission medications   Medication Sig Start Date End Date Taking? Authorizing Provider  atorvastatin (LIPITOR) 10 MG tablet TAKE 4 TABLETS EVERY DAY 04/15/23   Blanchard Kelch, NP  baclofen 5 MG TABS Take 1 tablet (5 mg total) by mouth 3 times/day as needed-between meals & bedtime for muscle spasms. Patient not taking: Reported on  11/16/2022 09/27/22   Doreene Eland, MD  bictegravir-emtricitabine-tenofovir AF (BIKTARVY) 50-200-25 MG TABS tablet Take 1 tablet by mouth daily. 11/16/22   Veryl Speak, FNP  diclofenac Sodium (VOLTAREN ARTHRITIS PAIN) 1 % GEL Apply 2 g topically 4 (four) times daily. 11/01/22   Rising, Lurena Joiner, PA-C  lisinopril-hydrochlorothiazide (ZESTORETIC) 10-12.5 MG tablet Take 1 tablet by mouth daily. 07/16/22   Evlyn Kanner, MD  metoprolol succinate (TOPROL XL) 25 MG 24 hr tablet Take 1 tablet (25 mg total) by mouth daily. 06/21/19   Lyn Records, MD  omeprazole (PRILOSEC) 20 MG capsule Take 1 capsule (20 mg total) by mouth 2 (two) times daily before a meal for 28 days, THEN 1 capsule (20 mg total) daily for 28 days. 07/12/20 07/09/22  Blanchard Kelch, NP  rivaroxaban (XARELTO) 20 MG TABS tablet TAKE 1 TABLET BY MOUTH DAILY WITH SUPPER. 04/15/23   Joylene Grapes, NP  traZODone (DESYREL) 50 MG tablet Take 0.5 tablets (25 mg total) by mouth at bedtime. 10/24/19   Ginnie Smart, MD  Vitamin D, Ergocalciferol, 50000 units CAPS Take 1 capsule by mouth once a week. 07/22/20   Blanchard Kelch, NP    Current Facility-Administered Medications  Medication Dose Route Frequency Provider Last Rate Last Admin   0.9 %  sodium chloride infusion (Manually program via Guardrails IV Fluids)   Intravenous Once Tegeler, Canary Brim, MD       Current Outpatient Medications  Medication Sig Dispense Refill   atorvastatin (LIPITOR) 10 MG tablet TAKE 4 TABLETS EVERY DAY 30 tablet 1   baclofen 5 MG TABS Take 1 tablet (5 mg total) by mouth 3 times/day as needed-between meals & bedtime for muscle spasms. (Patient not taking: Reported on 11/16/2022) 15 tablet 0   bictegravir-emtricitabine-tenofovir AF (BIKTARVY) 50-200-25 MG TABS tablet Take 1 tablet by mouth daily. 30 tablet 5   diclofenac Sodium (VOLTAREN ARTHRITIS PAIN) 1 % GEL Apply 2 g topically 4 (four) times daily. 50 g 0   lisinopril-hydrochlorothiazide  (ZESTORETIC) 10-12.5 MG tablet Take 1 tablet by mouth daily. 90 tablet 3   metoprolol succinate (TOPROL XL) 25 MG 24 hr tablet Take 1 tablet (25 mg total) by mouth daily. 90 tablet 3   omeprazole (PRILOSEC) 20 MG capsule Take 1 capsule (20 mg total) by mouth 2 (two) times daily before a meal for 28 days, THEN 1 capsule (20 mg total) daily for 28 days. 84 capsule 0   rivaroxaban (XARELTO) 20 MG TABS tablet TAKE 1 TABLET BY MOUTH DAILY WITH SUPPER. 90 tablet 1   traZODone (DESYREL) 50 MG tablet Take 0.5 tablets (25 mg total) by mouth at bedtime. 30 tablet 3   Vitamin D, Ergocalciferol, 50000 units CAPS Take 1 capsule by mouth once a week. 8 capsule 0    Allergies as of 04/30/2023   (No Known Allergies)    Family History  Problem Relation Age of Onset   Cancer Mother        uterine vs ovarian?   Seizures Father    Mental illness Brother  Breast cancer Maternal Aunt    Diabetes Daughter    Colon polyps Neg Hx    Esophageal cancer Neg Hx    Rectal cancer Neg Hx    Stomach cancer Neg Hx    Thyroid disease Neg Hx     Social History   Socioeconomic History   Marital status: Single    Spouse name: Not on file   Number of children: Not on file   Years of education: Not on file   Highest education level: Not on file  Occupational History   Not on file  Tobacco Use   Smoking status: Former    Current packs/day: 0.00    Average packs/day: 2.0 packs/day for 20.0 years (40.0 ttl pk-yrs)    Types: Cigarettes    Start date: 68    Quit date: 2004    Years since quitting: 21.1   Smokeless tobacco: Never   Tobacco comments:    20 years ago  Vaping Use   Vaping status: Never Used  Substance and Sexual Activity   Alcohol use: Not Currently    Alcohol/week: 6.0 standard drinks of alcohol    Types: 6 Cans of beer per week   Drug use: Yes    Frequency: 0.5 times per week    Types: Marijuana   Sexual activity: Not Currently    Partners: Male    Birth control/protection: Condom     Comment: declined condoms 05/11/22  Other Topics Concern   Not on file  Social History Narrative   Not on file   Social Drivers of Health   Financial Resource Strain: Not on file  Food Insecurity: Not on file  Transportation Needs: Not on file  Physical Activity: Not on file  Stress: Not on file  Social Connections: Not on file  Intimate Partner Violence: Not on file   Review of Systems: Gen: Denies fever, sweats or chills. No weight loss.  CV: Denies chest pain, palpitations or edema. Resp: Denies cough, shortness of breath of hemoptysis.  GI:See HPI.    GU : Denies urinary burning, blood in urine, increased urinary frequency or incontinence. MS: Denies joint pain, muscles aches or weakness. Derm: Denies rash, itchiness, skin lesions or unhealing ulcers. Psych: Denies depression, anxiety, memory loss or confusion. Heme: Denies easy bruising, bleeding. Neuro:  Denies headaches, dizziness or paresthesias. Endo:  Denies any problems with DM, thyroid or adrenal function.  Physical Exam: Vital signs in last 24 hours: Temp:  [98 F (36.7 C)-99 F (37.2 C)] 99 F (37.2 C) (02/21 1255) Pulse Rate:  [76-96] 76 (02/21 1255) Resp:  [16] 16 (02/21 1255) BP: (100-107)/(55-64) 100/64 (02/21 1255) SpO2:  [100 %] 100 % (02/21 1255) Weight:  [83.1 kg] 83.1 kg (02/21 0958)   General: Alert 63 year old female in no acute distress. Head:  Normocephalic and atraumatic. Eyes:  No scleral icterus. Conjunctiva pink. Ears:  Normal auditory acuity. Nose:  No deformity, discharge or lesions. Mouth: Absent upper Titian.  No ulcers or lesions.  Neck:  Supple. No lymphadenopathy or thyromegaly.  Lungs: Breath sounds clear throughout. No wheezes, rhonchi or crackles.  Heart: Regular rhythm, no murmur. Abdomen: Soft, nondistended.  Nontender.  Positive bowel sounds to all 4 quadrants.  No hepatosplenomegaly.  No palpable mass.  No bruit. Rectal: FOBT pending. Musculoskeletal:  Symmetrical without  gross deformities.  Pulses:  Normal pulses noted. Extremities:  Without clubbing or edema. Neurologic:  Alert and  oriented x 4. No focal deficits.  Skin:  Intact without significant  lesions or rashes. Psych:  Alert and cooperative. Normal mood and affect.  Intake/Output from previous day: No intake/output data recorded. Intake/Output this shift: No intake/output data recorded.  Lab Results: Recent Labs    04/30/23 1056 04/30/23 1300  WBC 3.7* 4.2  HGB 6.0* 6.0*  HCT 21.0* 22.0*  PLT 328 342   BMET Recent Labs    04/30/23 1056 04/30/23 1300  NA 136 136  K 3.2* 3.4*  CL 103 105  CO2 25 21*  GLUCOSE 106* 98  BUN 8 7*  CREATININE 1.16* 1.09*  CALCIUM 9.1 8.8*   LFT Recent Labs    04/30/23 1300  PROT 7.4  ALBUMIN 3.4*  AST 21  ALT 13  ALKPHOS 65  BILITOT 0.3   PT/INR No results for input(s): "LABPROT", "INR" in the last 72 hours. Hepatitis Panel No results for input(s): "HEPBSAG", "HCVAB", "HEPAIGM", "HEPBIGM" in the last 72 hours.    Studies/Results: No results found.  IMPRESSION/PLAN:  63 year old female presents with symptomatic anemia and intermittent black stools for the past 2 months.  Hemoglobin 6.  One unit of PRBCs ordered, not yet transfused.  Hemodynamically stable.  No active melena/GI bleeding since arriving to the ED. -Regular diet  -NPO after midnight -EGD benefits and risks discussed including risk with sedation, risk of bleeding, perforation and infection -Pantoprazole 40 mg IV twice daily -Check H&H posttransfusion then Q 6 hours then  -Continue to hold Xarelto  GERD -See plan above  Atrial fibrillation on Xarelto, last dose was 04/29/2023 at 12 PM -Continue to hold Xarelto  History of a sessile serrated polyp per colonoscopy 08/2016.  Colonoscopy 11/2021 showed diverticulosis, internal hemorrhoids and no polyps.  HIV positive on Biktarvy     Arnaldo Natal  04/30/2023, 3:35 PM

## 2023-05-01 ENCOUNTER — Observation Stay (HOSPITAL_COMMUNITY): Payer: 59 | Admitting: Anesthesiology

## 2023-05-01 ENCOUNTER — Encounter (HOSPITAL_COMMUNITY)
Admission: EM | Disposition: A | Discharge status: Home / Self Care | Payer: Self-pay | Source: Home / Self Care | Attending: Internal Medicine

## 2023-05-01 ENCOUNTER — Observation Stay (HOSPITAL_BASED_OUTPATIENT_CLINIC_OR_DEPARTMENT_OTHER): Payer: 59 | Admitting: Anesthesiology

## 2023-05-01 ENCOUNTER — Encounter (HOSPITAL_COMMUNITY): Payer: Self-pay | Admitting: Internal Medicine

## 2023-05-01 DIAGNOSIS — K922 Gastrointestinal hemorrhage, unspecified: Secondary | ICD-10-CM

## 2023-05-01 DIAGNOSIS — B9681 Helicobacter pylori [H. pylori] as the cause of diseases classified elsewhere: Secondary | ICD-10-CM

## 2023-05-01 DIAGNOSIS — K222 Esophageal obstruction: Secondary | ICD-10-CM | POA: Diagnosis not present

## 2023-05-01 DIAGNOSIS — K2950 Unspecified chronic gastritis without bleeding: Secondary | ICD-10-CM | POA: Diagnosis not present

## 2023-05-01 DIAGNOSIS — K269 Duodenal ulcer, unspecified as acute or chronic, without hemorrhage or perforation: Secondary | ICD-10-CM

## 2023-05-01 DIAGNOSIS — K449 Diaphragmatic hernia without obstruction or gangrene: Secondary | ICD-10-CM

## 2023-05-01 DIAGNOSIS — K2970 Gastritis, unspecified, without bleeding: Secondary | ICD-10-CM

## 2023-05-01 HISTORY — PX: ESOPHAGOGASTRODUODENOSCOPY (EGD) WITH PROPOFOL: SHX5813

## 2023-05-01 HISTORY — PX: BIOPSY: SHX5522

## 2023-05-01 LAB — BPAM RBC
Blood Product Expiration Date: 202503202359
Blood Product Expiration Date: 202503202359
ISSUE DATE / TIME: 202502211616
ISSUE DATE / TIME: 202502212321
Unit Type and Rh: 7300
Unit Type and Rh: 7300

## 2023-05-01 LAB — TYPE AND SCREEN
ABO/RH(D): B POS
Antibody Screen: NEGATIVE
Unit division: 0
Unit division: 0

## 2023-05-01 LAB — BASIC METABOLIC PANEL
Anion gap: 14 (ref 5–15)
BUN: 10 mg/dL (ref 8–23)
CO2: 19 mmol/L — ABNORMAL LOW (ref 22–32)
Calcium: 8.7 mg/dL — ABNORMAL LOW (ref 8.9–10.3)
Chloride: 105 mmol/L (ref 98–111)
Creatinine, Ser: 1.1 mg/dL — ABNORMAL HIGH (ref 0.44–1.00)
GFR, Estimated: 56 mL/min — ABNORMAL LOW (ref >60)
Glucose, Bld: 95 mg/dL (ref 70–99)
Potassium: 4.1 mmol/L (ref 3.5–5.1)
Sodium: 138 mmol/L (ref 135–145)

## 2023-05-01 LAB — CBC
HCT: 26.2 % — ABNORMAL LOW (ref 36.0–46.0)
HCT: 25.1 % — ABNORMAL LOW (ref 36.0–46.0)
Hemoglobin: 8 g/dL — ABNORMAL LOW (ref 12.0–15.0)
Hemoglobin: 8 g/dL — ABNORMAL LOW (ref 12.0–15.0)
MCH: 24.1 pg — ABNORMAL LOW (ref 26.0–34.0)
MCH: 24.7 pg — ABNORMAL LOW (ref 26.0–34.0)
MCHC: 30.5 g/dL (ref 30.0–36.0)
MCHC: 31.9 g/dL (ref 30.0–36.0)
MCV: 78.9 fL — ABNORMAL LOW (ref 80.0–100.0)
MCV: 77.5 fL — ABNORMAL LOW (ref 80.0–100.0)
Platelets: 251 10*3/uL (ref 150–400)
Platelets: 308 10*3/uL (ref 150–400)
RBC: 3.32 MIL/uL — ABNORMAL LOW (ref 3.87–5.11)
RBC: 3.24 MIL/uL — ABNORMAL LOW (ref 3.87–5.11)
RDW: 17.5 % — ABNORMAL HIGH (ref 11.5–15.5)
RDW: 17.6 % — ABNORMAL HIGH (ref 11.5–15.5)
WBC: 4.4 10*3/uL (ref 4.0–10.5)
WBC: 4.2 10*3/uL (ref 4.0–10.5)
nRBC: 0 % (ref 0.0–0.2)
nRBC: 0 % (ref 0.0–0.2)

## 2023-05-01 SURGERY — ESOPHAGOGASTRODUODENOSCOPY (EGD) WITH PROPOFOL
Anesthesia: Monitor Anesthesia Care

## 2023-05-01 MED ORDER — LACTATED RINGERS IV SOLN: INTRAVENOUS | Status: DC | PRN

## 2023-05-01 MED ORDER — PROPOFOL 10 MG/ML IV BOLUS
INTRAVENOUS | Status: DC | PRN
  Administered 2023-05-01: 30 mg via INTRAVENOUS
  Administered 2023-05-01: 20 mg via INTRAVENOUS

## 2023-05-01 MED ORDER — PANTOPRAZOLE SODIUM 40 MG PO TBEC
40.0000 mg | DELAYED_RELEASE_TABLET | Freq: Two times a day (BID) | ORAL | Status: DC
  Administered 2023-05-01 – 2023-05-02 (×2): 40 mg via ORAL
  Filled 2023-05-01 (×2): qty 1

## 2023-05-01 MED ORDER — ACETAMINOPHEN 325 MG PO TABS
650.0000 mg | ORAL_TABLET | Freq: Four times a day (QID) | ORAL | Status: DC | PRN
  Administered 2023-05-01: 650 mg via ORAL
  Filled 2023-05-01: qty 2

## 2023-05-01 MED ORDER — PROPOFOL 500 MG/50ML IV EMUL
INTRAVENOUS | Status: DC | PRN
  Administered 2023-05-01: 140 ug/kg/min via INTRAVENOUS

## 2023-05-01 MED ORDER — PHENYLEPHRINE 80 MCG/ML (10ML) SYRINGE FOR IV PUSH (FOR BLOOD PRESSURE SUPPORT)
PREFILLED_SYRINGE | INTRAVENOUS | Status: DC | PRN
  Administered 2023-05-01: 160 ug via INTRAVENOUS

## 2023-05-01 SURGICAL SUPPLY — 14 items

## 2023-05-01 NOTE — Op Note (Signed)
 [facility] Patient Name: Vicki Brown Procedure Date : 05/01/2023 MRN: [id number] Attending MD: Particia Lather , , [id number] Date of Birth: 1960/11/14 CSN: [id number] Age: 63 Admit Type: Inpatient Procedure:                Upper GI endoscopy Indications:              Iron deficiency anemia, Melena Providers:                Madelyn Brunner" Francine Graven, RN, Alan Ripper,                            Technician Referring MD:             Hospitalist team Medicines:                Monitored Anesthesia Care Complications:            No immediate complications. Estimated Blood Loss:     Estimated blood loss was minimal. Procedure:                Pre-Anesthesia Assessment:                           - Prior to the procedure, a History and Physical                            was performed, and patient medications and                            allergies were reviewed. The patient's tolerance of                            previous anesthesia was also reviewed. The risks                            and benefits of the procedure and the sedation                            options and risks were discussed with the patient.                            All questions were answered, and informed consent                            was obtained. Prior Anticoagulants: The patient has                            taken Xarelto (rivaroxaban), last dose was 2 days                            prior to procedure. ASA Grade Assessment: III - A                            patient with severe systemic disease. After  reviewing the risks and benefits, the patient was                            deemed in satisfactory condition to undergo the                            procedure.                           After obtaining informed consent, the endoscope was                            passed under direct vision. Throughout the                            procedure, the  patient's blood pressure, pulse, and                            oxygen saturations were monitored continuously. The                            [id number]-H190 ([id number]) Olympus endoscope was introduced                            through the mouth, and advanced to the second part                            of duodenum. The upper GI endoscopy was                            accomplished without difficulty. The patient                            tolerated the procedure well. Scope In: Scope Out: Findings:      One moderate (circumferential scarring or stenosis; an endoscope may       pass) stenosis was found at the gastroesophageal junction. This stenosis       measured less than one cm (in length). The stenosis was traversed.       Biopsies were taken with a cold forceps for histology.      A large hiatal hernia was present.      Localized inflammation characterized by congestion (edema), erosions and       erythema was found in the gastric antrum. Biopsies were taken with a       cold forceps for histology.      A few localized erosions without bleeding were found in the duodenal       bulb. Impression:               - Esophageal stenosis. Biopsied.                           - Large hiatal hernia.                           - Gastritis. Biopsied.                           -  Duodenal erosions without bleeding. Recommendation:           - Return patient to hospital ward for ongoing care.                           - Patient's IDA may have been slow blood loss over                            time due to gastric and duodenal erosions. Her                            melena was present upon arrival to this                            hospitalization.                           - Await pathology results.                           - PPI BID for 10 weeks, then QD                           - Trend hemoglobin for now. If Hb remains stable,                            then no further work up may be needed at  this time.                            If Hb drops, then would consider VCE. I do wonder                            if the patient will be able to swallow a VCE                            effectively since she does have a significant                            esophageal stenosis. I did not dilate the stenosis                            today because esophageal dilation is considered                            high risk for bleeding, though this could certainly                            be considered in the future. If patient needs a VCE                            in the future, then she may need a repeat EGD or  SBE for VCE placement.                           - CLD for today. Advance tomorrow if patient's Hb                            remains stable.                           - Consider restarting Xarelto tomorrow if Hb                            remains stable.                           - The findings and recommendations were discussed                            with the patient. Procedure Code(s):        --- Professional ---                           [phone], Esophagogastroduodenoscopy, flexible,                            transoral; with biopsy, single or multiple Diagnosis Code(s):        --- Professional ---                           K22.2, Esophageal obstruction                           K44.9, Diaphragmatic hernia without obstruction or                            gangrene                           K29.70, Gastritis, unspecified, without bleeding                           K26.9, Duodenal ulcer, unspecified as acute or                            chronic, without hemorrhage or perforation                           D50.9, Iron deficiency anemia, unspecified                           K92.1, Melena (includes Hematochezia) CPT copyright 2022 American [employer] Association. All rights reserved. The codes documented in this report are preliminary and upon coder  review may  be revised to meet current compliance requirements. Dr Particia Lather "Alan Ripper" Aneta,  05/01/2023 10:10:56 AM Number of Addenda: 0

## 2023-05-01 NOTE — Progress Notes (Addendum)
                  Subjective:   Summary: 63 yo woman with history of Afib on Xarelto, well controlled HIV presenting with dizziness and weakness, admitted for IDA and GI bleed  Feeling well today. She ambulated yesterday without significant dizziness. Has not had a bloody or melenic stool since admission. Understands the EGD she will have done today  Objective:  Vital signs in last 24 hours: Vitals:   04/30/23 2312 04/30/23 2342 05/01/23 0245 05/01/23 0600  BP: 104/65 105/64 98/64 119/77  Pulse: 83 77 79 76  Resp: 20 18 18 18   Temp: 98.1 F (36.7 C) 97.8 F (36.6 C) 99 F (37.2 C) 98.6 F (37 C)  TempSrc:  Oral Oral Oral  SpO2:    100%   Supplemental O2: Room Air SpO2: 100 %  Physical Exam:  Constitutional: well appearing, no acute distress Cardiovascular: RRR Pulmonary/Chest: CTAB Abdominal: soft, nontender, nondistended Neuro: A&O x 3  Assessment/Plan:   Microcytic anemia Melena, hematochezia Post-transfusion HH was 6.7, another 1 unit pRBC ordered with post transfusion Hgb 8.0. GI has been consulted and plan for EGD today. Patient has been NPO since midnight - EGD today  Noncardiac chest pain - suspect symptomatic anemia Improved. Denies episodes of chest pain since admission.   Hypokalemia Trend BMP, replete as necessary  HTN: holding home antihypertensives d/t soft BP HIV: continue home Biktarvy Atrial fibrillation: NSR. Holding home Xarelto and metoprolol d/t soft BP HLD: continue home statin  Diet: NPO VTE: SCD Code: Full  Dispo: Anticipated discharge to Home pending medical stability.   Monna Fam, MD PGY-1 Internal Medicine Resident Pager Number [phone] Please contact the on call pager after 5 pm and on weekends at [phone].

## 2023-05-01 NOTE — Transfer of Care (Signed)
 Immediate Anesthesia Transfer of Care Note  Patient: Vicki Brown  Procedure(s) Performed: ESOPHAGOGASTRODUODENOSCOPY (EGD) WITH PROPOFOL BIOPSY  Patient Location: PACU  Anesthesia Type:MAC  Level of Consciousness: drowsy and patient cooperative  Airway & Oxygen Therapy: Patient Spontanous Breathing  Post-op Assessment: Report given to RN and Post -op Vital signs reviewed and stable  Post vital signs: Reviewed and stable  Last Vitals:  Vitals Value Taken Time  BP 96/61 05/01/23 1008  Temp 36.7 C 05/01/23 1008  Pulse 87 05/01/23 1013  Resp 22 05/01/23 1013  SpO2 98 % 05/01/23 1013  Vitals shown include unfiled device data.  Last Pain:  Vitals:   05/01/23 1008  TempSrc:   PainSc: 0-No pain         Complications: No notable events documented.

## 2023-05-01 NOTE — Anesthesia Postprocedure Evaluation (Signed)
 Anesthesia Post Note  Patient: Vicki Brown  Procedure(s) Performed: ESOPHAGOGASTRODUODENOSCOPY (EGD) WITH PROPOFOL BIOPSY     Patient location during evaluation: PACU Anesthesia Type: MAC Level of consciousness: awake and alert Pain management: pain level controlled Vital Signs Assessment: post-procedure vital signs reviewed and stable Respiratory status: spontaneous breathing Cardiovascular status: stable Anesthetic complications: no   No notable events documented.  Last Vitals:  Vitals:   05/01/23 1030 05/01/23 1055  BP: 94/61 95/64  Pulse: 71 83  Resp: 20 20  Temp: 36.7 C 37 C  SpO2: 96% 99%    Last Pain:  Vitals:   05/01/23 1055  TempSrc: Oral  PainSc:                  Lewie Loron

## 2023-05-01 NOTE — Interval H&P Note (Signed)
 History and Physical Interval Note:  05/01/2023 9:28 AM  Vicki Brown  has presented today for surgery, with the diagnosis of Anemia, black stools.  The various methods of treatment have been discussed with the patient and family. After consideration of risks, benefits and other options for treatment, the patient has consented to  Procedure(s): ESOPHAGOGASTRODUODENOSCOPY (EGD) WITH PROPOFOL (N/A) as a surgical intervention.  The patient's history has been reviewed, patient examined, no change in status, stable for surgery.  I have reviewed the patient's chart and labs.  Questions were answered to the patient's satisfaction.     Imogene Burn

## 2023-05-02 ENCOUNTER — Other Ambulatory Visit: Payer: Self-pay | Admitting: Internal Medicine

## 2023-05-02 LAB — BASIC METABOLIC PANEL
Anion gap: 6 (ref 5–15)
BUN: 7 mg/dL — ABNORMAL LOW (ref 8–23)
CO2: 27 mmol/L (ref 22–32)
Calcium: 9.1 mg/dL (ref 8.9–10.3)
Chloride: 105 mmol/L (ref 98–111)
Creatinine, Ser: 1.11 mg/dL — ABNORMAL HIGH (ref 0.44–1.00)
GFR, Estimated: 56 mL/min — ABNORMAL LOW (ref >60)
Glucose, Bld: 100 mg/dL — ABNORMAL HIGH (ref 70–99)
Potassium: 3.8 mmol/L (ref 3.5–5.1)
Sodium: 138 mmol/L (ref 135–145)

## 2023-05-02 LAB — CBC
HCT: 25.9 % — ABNORMAL LOW (ref 36.0–46.0)
Hemoglobin: 7.9 g/dL — ABNORMAL LOW (ref 12.0–15.0)
MCH: 23.7 pg — ABNORMAL LOW (ref 26.0–34.0)
MCHC: 30.5 g/dL (ref 30.0–36.0)
MCV: 77.8 fL — ABNORMAL LOW (ref 80.0–100.0)
Platelets: 294 10*3/uL (ref 150–400)
RBC: 3.33 MIL/uL — ABNORMAL LOW (ref 3.87–5.11)
RDW: 17.4 % — ABNORMAL HIGH (ref 11.5–15.5)
WBC: 3.5 10*3/uL — ABNORMAL LOW (ref 4.0–10.5)
nRBC: 0 % (ref 0.0–0.2)

## 2023-05-02 MED ORDER — PANTOPRAZOLE SODIUM 40 MG PO TBEC: 40.0000 mg | DELAYED_RELEASE_TABLET | Freq: Two times a day (BID) | ORAL | 0 refills | Status: AC

## 2023-05-02 MED ORDER — SODIUM CHLORIDE 0.9 % IV SOLN
250.0000 mg | Freq: Once | INTRAVENOUS | Status: AC
  Administered 2023-05-02: 250 mg via INTRAVENOUS
  Filled 2023-05-02: qty 20

## 2023-05-02 MED ORDER — RIVAROXABAN 20 MG PO TABS: 20.0000 mg | ORAL_TABLET | Freq: Every day | ORAL | Status: DC

## 2023-05-02 MED ORDER — POLYSACCHARIDE IRON COMPLEX 150 MG PO CAPS
150.0000 mg | ORAL_CAPSULE | ORAL | 2 refills | Status: DC
Start: 2023-05-03 — End: 2023-05-03

## 2023-05-02 MED ORDER — DIPHENHYDRAMINE-ZINC ACETATE 2-0.1 % EX CREA
TOPICAL_CREAM | Freq: Once | CUTANEOUS | Status: AC
  Filled 2023-05-02: qty 28

## 2023-05-02 MED ORDER — POLYSACCHARIDE IRON COMPLEX 150 MG PO CAPS: 150.0000 mg | ORAL_CAPSULE | ORAL | Status: DC

## 2023-05-02 NOTE — Discharge Summary (Signed)
 Name: Vicki Brown MRN: [id number] DOB: [birth year] 63 y.o. PCP: Philomena Doheny, MD  Date of Admission: 04/30/2023 12:46 PM Date of Discharge: 05/02/23 Attending Physician: Dr. Antony Contras  Discharge Diagnosis: Principal Problem:   GI bleed Active Problems:   Human immunodeficiency virus (HIV) disease (HCC)   Atrial fibrillation (HCC)   Iron deficiency anemia   Symptomatic anemia    Discharge Medications: Allergies as of 05/02/2023   No Known Allergies      Medication List     PAUSE taking these medications    lisinopril-hydrochlorothiazide 10-12.5 MG tablet Wait to take this until your doctor or other care provider tells you to start again. Commonly known as: ZESTORETIC Take 1 tablet by mouth daily.       STOP taking these medications    omeprazole 20 MG capsule Commonly known as: PRILOSEC       TAKE these medications    atorvastatin 10 MG tablet Commonly known as: LIPITOR TAKE 4 TABLETS EVERY DAY   Biktarvy 50-200-25 MG Tabs tablet Generic drug: bictegravir-emtricitabine-tenofovir AF Take 1 tablet by mouth daily.   diclofenac Sodium 1 % Gel Commonly known as: Voltaren Arthritis Pain Apply 2 g topically 4 (four) times daily. What changed:  when to take this reasons to take this   iron polysaccharides 150 MG capsule Commonly known as: NIFEREX Take 1 capsule (150 mg total) by mouth every other day. Start taking on: May 03, 2023   metoprolol succinate 25 MG 24 hr tablet Commonly known as: Toprol XL Take 1 tablet (25 mg total) by mouth daily.   pantoprazole 40 MG tablet Commonly known as: PROTONIX Take 1 tablet (40 mg total) by mouth 2 (two) times daily.   traZODone 50 MG tablet Commonly known as: DESYREL Take 0.5 tablets (25 mg total) by mouth at bedtime.   Vitamin D (Ergocalciferol) 50000 units Caps Take 1 capsule by mouth once a week.   Xarelto 20 MG Tabs tablet Generic drug: rivaroxaban TAKE 1 TABLET BY MOUTH DAILY  WITH SUPPER.        Disposition and follow-up:   Ms.Nan L Azar was discharged from [facility] in Stable condition.  At the hospital follow up visit please address:  1.  Follow-up: a. Blood count- Hgb at 6 on admission s/p 2 units improved to 8. Xarelto restarted on day of discharge. Ganzoni equation with iron deficit of 1700 mg. Down to 1200 after 2 units blood. She received 250 mg ferrlicit on day of discharge and was started on PO iron.   b. Blood pressure- held combo lisinopril-hydrochlorothiazide at discharge as BP low normal. Restarted BB.  C. Esophageal stenosis- needs follow-up with GI  2.  Labs / imaging needed at time of follow-up: CBC  3.  Pending labs/ test needing follow-up: biopsies from EGD  4.  Medication Changes New- pantoprazole 40 mg BID for 10 weeks ( end 07/11/23)          Nu-iron every other day Held- lisinopril-hydrochlorothiazide  Follow-up Appointments: [facility]- message sent requesting help with scheduling follow-up  [facility]  Hospital Course by problem list: Symptomatic anemia Iron deficiency Duodenal ulcer Patient presented to Internal medicine clinic with several weeks of progressive fatigue and dyspnea. She had episodes of dark tarry stools and bright red blood over the last few weeks. Hemoglobin in clinic at 6. Iron studies showed iron deficit of 1700. She was transfused 2 units of PRBCs with adequate response. No additional episodes of blood while in  hospital. [facility] completed EGD that showed esophageal stenosis, large hiatal hernia, gastritis, and duodenal erosions without bleeding. Repeat hemoglobin was stable and patient was discharged with instructions to restart DOAC, start PPI BID, PO iron and restart regular diet. She received one infusion of IV iron, deficit remains around 1000 mg.  Esophageal stenosis Dysphagia She will need repeat EGD to evaluated ulcers. If healed then may get esophageal dilation. She confirms  several month history of dysphagia.  Hypokalemia Repleted during admission.  Atrial fibrillation Xarelto restarted after hemoglobin stable following EGD.   Hypertension Blood pressure was normotensive but on lower side. Medications were held include lisinopril-hydrochlorothiazide and beta-blocker. Beta blocker was restarted at discharge. She will follow-up with clinic for guidance on restarting ACEI/ hydrochlorothiazide.  HIV Follows regularly for this. Adherent with biktarvy.  HLD Statin continued at discharge.    Discharge Subjective: Patient evaluated at bedside this AM. She feels well this morning. We reviewed EGD results from day prior. She has noted some difficulty with swallowing but thought this was from not chewing food enough with dentition. Difficulty swallowing has been present for several months.   Discharge Exam:   BP 125/69 (BP Location: Left Arm)   Pulse 66   Temp 98.4 F (36.9 C) (Oral)   Resp 18   SpO2 100%  Constitutional: well-appearing, in no acute distress Cardiovascular: regular rate and rhythm, no m/r/g Pulmonary/Chest: normal work of breathing on room air, lungs clear to auscultation bilaterally Abdominal: soft, non-tender, non-distended MSK: no lower extremity edema Skin: warm and dry  Pertinent Labs, Studies, and Procedures:     Latest Ref Rng & Units 05/02/2023    7:32 AM 05/01/2023    6:58 PM 05/01/2023    6:17 AM  CBC  WBC 4.0 - 10.5 K/uL 3.5  4.2  4.4   Hemoglobin 12.0 - 15.0 g/dL 7.9  8.0  8.0   Hematocrit 36.0 - 46.0 % 25.9  25.1  26.2   Platelets 150 - 400 K/uL 294  308  251        Latest Ref Rng & Units 05/02/2023    7:32 AM 05/01/2023    6:17 AM 04/30/2023    1:00 PM  CMP  Glucose 70 - 99 mg/dL 725  95  98   BUN 8 - 23 mg/dL 7  10  7    Creatinine 0.44 - 1.00 mg/dL 3.66  4.40  3.47   Sodium 135 - 145 mmol/L 138  138  136   Potassium 3.5 - 5.1 mmol/L 3.8  4.1  3.4   Chloride 98 - 111 mmol/L 105  105  105   CO2 22 - 32 mmol/L 27   19  21    Calcium 8.9 - 10.3 mg/dL 9.1  8.7  8.8   Total Protein 6.5 - 8.1 g/dL   7.4   Total Bilirubin 0.0 - 1.2 mg/dL   0.3   Alkaline Phos 38 - 126 U/L   65   AST 15 - 41 U/L   21   ALT 0 - 44 U/L   13     DG Chest Portable 1 View Result Date: 04/30/2023 CLINICAL DATA:  Short of breath EXAM: PORTABLE CHEST 1 VIEW COMPARISON:  06/03/2020 FINDINGS: Single frontal view of the chest demonstrates a stable cardiac silhouette. No airspace disease, effusion, or pneumothorax. Stable hiatal hernia. No acute bony abnormality. IMPRESSION: 1. Stable chest, no acute process. Electronically Signed   By: Sharlet Salina M.D.   On: 04/30/2023 15:36  Discharge Instructions: Discharge Instructions     Diet - low sodium heart healthy   Complete by: As directed    Discharge instructions   Complete by: As directed    Ms. Tremper,  You were admitted with low blood count from a GI bleed. After 2 units of blood your blood count is stable.  The GI team did a procedure to look in your food pipe (esophagus) to see if you were bleeding. This showed some ulcers and narrowing of your esophagus. There were no signs of active bleeding. They took some tissue samples of your esophagus and those should come back in a few days. You are being discharged home with some new medications, including an iron tablet and pantoprazole to decrease stomach acid to help heal ulcers. You will need to follow-up with Internal Medicine Clinic and the GI doctors.  New Medications: Pantoprazole 40 mg 2 times daily for 10 weeks (07/11/2023) Nu-iron every other day  Stop:  Lisinopril-hydrochlorothiazide: your blood pressures were low normal while in hospital. Call the Internal Medicine clinic for follow-up and they will guide you on if it is safe to restart that medication.  Continue: Xarelto 20 mg Biktarvy Atorvastatin Metoprolol  Trazodone Vitamin D  I am glad you are feeling better. If you start to have worsening fatigue,  shortness of breath or additional bleeding then please come back to emergency room.  Thank you, Dr. Sloan Leiter   Increase activity slowly   Complete by: As directed        Signed: Rudene Christians, DO 05/02/2023, 11:50 AM   Pager: [phone]

## 2023-05-02 NOTE — Plan of Care (Signed)

## 2023-05-02 NOTE — Progress Notes (Signed)
 Approximately 2300 pt verbalized a brief episode of chest pain at rest, denied jaw & L arm pain, stated it was a sharp pain, lasted approximately 10 seconds. Pain was gone by the time I saw pt. Vss, notified on call doctor, instructed this nurse to monitor if this should reoccur.

## 2023-05-03 ENCOUNTER — Telehealth: Payer: Self-pay

## 2023-05-03 ENCOUNTER — Other Ambulatory Visit: Payer: Self-pay | Admitting: Student

## 2023-05-03 MED ORDER — POLYSACCHARIDE IRON COMPLEX 150 MG PO CAPS: 150.0000 mg | ORAL_CAPSULE | Freq: Every day | ORAL | 2 refills | Status: DC

## 2023-05-03 NOTE — Telephone Encounter (Signed)
-----   Message from Imogene Burn sent at 05/02/2023  2:12 PM EST ----- [facility], could you arrange for GI clinic follow up with Dr. Myrtie Neither or APP in 1-2 months for iron deficiency anemia? Thanks.

## 2023-05-03 NOTE — Telephone Encounter (Signed)
 Hospital follow up scheduled with Dr. Myrtie Neither on Tuesday, 07/06/23 at 1:20 pm. Appt information mailed to patient.

## 2023-05-04 ENCOUNTER — Encounter (HOSPITAL_COMMUNITY): Payer: Self-pay | Admitting: Internal Medicine

## 2023-05-04 ENCOUNTER — Telehealth: Payer: Self-pay | Admitting: Internal Medicine

## 2023-05-04 ENCOUNTER — Encounter: Payer: Self-pay | Admitting: Internal Medicine

## 2023-05-04 LAB — SURGICAL PATHOLOGY

## 2023-05-04 MED ORDER — METRONIDAZOLE 500 MG PO TABS: 500.0000 mg | ORAL_TABLET | Freq: Four times a day (QID) | ORAL | 0 refills | Status: AC

## 2023-05-04 MED ORDER — TETRACYCLINE HCL 500 MG PO CAPS: 500.0000 mg | ORAL_CAPSULE | Freq: Four times a day (QID) | ORAL | 0 refills | Status: AC

## 2023-05-04 MED ORDER — BISMUTH SUBSALICYLATE 262 MG PO CHEW: 524.0000 mg | CHEWABLE_TABLET | Freq: Four times a day (QID) | ORAL | 0 refills | Status: AC

## 2023-05-04 NOTE — Telephone Encounter (Signed)
 Surgical pathology from patient's EGD resulted and was positive for H pylori gastritis (H pylori identified on HE stain).  I called patient to discuss these results and have sent in bismuth, metronidazole, and tetracycline to complete her quadruple therapy (she is already taking protonix 40 mg bid). I have also sent a message to the front desk to schedule her a hospital follow up/to ensure she is taking these medications correctly.

## 2023-05-04 NOTE — Progress Notes (Signed)
 Hi Beth, please let the patient know that gastric biopsies pathology came back positive for H pylori gastritis. Recommend bismuth quadruple therapy for treatment: - Tetracycline 500 mg BID x 14 days - Flagyl 250 mg QID x 14 days - Bismuth subsalicylate 524 mg QID x 14 days - PPI BID x 14 days  Patient will need to follow up with Dr. Myrtie Neither or APP in 2-3 months to test for eradication of H pylori.

## 2023-05-07 NOTE — Progress Notes (Signed)
 Internal Medicine Clinic Attending  Case discussed with the resident at the time of the visit.  We reviewed the resident's history and exam and pertinent patient test results.  I agree with the assessment, diagnosis, and plan of care documented in the resident's note.

## 2023-05-11 ENCOUNTER — Telehealth: Payer: Self-pay

## 2023-05-11 ENCOUNTER — Other Ambulatory Visit (HOSPITAL_COMMUNITY): Payer: Self-pay

## 2023-05-11 NOTE — Progress Notes (Signed)
 Cardiology Office Note:  .   Date:  05/18/2023  ID:  Vicki Brown, DOB 06/06/1960, MRN [id number] PCP: Philomena Doheny, MD  [facility] Providers Cardiologist:  Lesleigh Noe, MD (Inactive)    History of Present Illness: Vicki Brown is a 63 y.o. female  with a history of paroxysmal atrial fibrillation, hypertension, hyperlipidemia, and HIV.Stress test in 2019 was low risk. Echocardiogram in 2021 showed EF 60 to 65%, normal LV function, no RWMA.    Patient discharged 05/02/23 after admission for GI bleed Hbg 6.0 -up to 7.9 at discharge. Lisinopril/hydrochlorothiazide held due to low BP. Feeling much better but still a little lightheaded. Sees GI tomorrow. No chest pain, dyspnea, edema.  Not sure if she's taking metoprolol-she can't remember. She may have run out of it. Pharmacy confirms she hasn't filled. She thinks it made her sick and didn't help palpitations but she can't remember.  ROS:    Studies Reviewed: [name]         Prior CV Studies:    Echo 07/05/2019 1. Left ventricular ejection fraction, by estimation, is 60 to 65%. The  left ventricle has normal function. The left ventricle has no regional  wall motion abnormalities. Left ventricular diastolic parameters were  normal.   2. Right ventricular systolic function is normal. The right ventricular  size is normal. There is normal pulmonary artery systolic pressure.   3. The mitral valve is normal in structure. Trivial mitral valve  regurgitation. No evidence of mitral stenosis.   4. The aortic valve is tricuspid. Aortic valve regurgitation is trivial.  Mild aortic valve sclerosis is present, with no evidence of aortic valve  stenosis.   5. The inferior vena cava is normal in size with greater than 50%  respiratory variability, suggesting right atrial pressure of 3 mmHg.    Stress test 10/2017 Nuclear stress EF: 72%. The left ventricular ejection fraction is hyperdynamic (>65%). There was no ST  segment deviation noted during stress. The study is normal. This is a low risk study.   Normal resting and stress perfusion. No ischemia or infarction EF 72%    Risk Assessment/Calculations:    CHA2DS2-VASc Score = 2   This indicates a 2.2% annual risk of stroke. The patient's score is based upon: CHF History: 0 HTN History: 1 Diabetes History: 0 Stroke History: 0 Vascular Disease History: 0 Age Score: 0 Gender Score: 1             Physical Exam:   VS:  BP 130/80 (Patient Position: Standing)   Pulse 81   Ht 5\' 6"  (1.676 m)   Wt 188 lb 3.2 oz (85.4 kg)   SpO2 97%   BMI 30.38 kg/m    Wt Readings from Last 3 Encounters:  05/18/23 188 lb 3.2 oz (85.4 kg)  04/30/23 183 lb 3.2 oz (83.1 kg)  11/16/22 182 lb (82.6 kg)    GEN: Well nourished, well developed in no acute distress NECK: No JVD; No carotid bruits CARDIAC:  RRR, no murmurs, rubs, gallops RESPIRATORY:  Clear to auscultation without rales, wheezing or rhonchi  ABDOMEN: Soft, non-tender, non-distended EXTREMITIES:  No edema; No deformity   ASSESSMENT AND PLAN: .    PAF on Xarelto but she's not sure she's taking metoprolol. We called her pharmacy and she hasn't filled it in 2 yrs. Although patient changed her pharmacy. Sometimes has palpitations. HR regular and controlled today. Will resume toprol xl 25 mg and see her  back in 1 month.  HTN-lisinopril/hydrochlorothiazide on hold since GI bleed  HLD-LDL 119 03/16/22 on lipitor. Due for repeat labs but not fasting today. Will check at next OV.        Dispo: f/u in 1 month  Signed, Jacolyn Reedy, PA-C

## 2023-05-11 NOTE — Telephone Encounter (Signed)
 Patient left vm stating she is having trouble filling B[facility]tarvy prescription at [facility]. Called [facility] and spoke with Rob, Pharmacist who confirmed refills on file, but states copay hard on file has expired.  Spoke with [employer]. Pharmacy tech at office who will fax updated copay card to [facility]. Requested pt call pharmacy in an hour for refill. Will call office back with any concerns  [name], RMA

## 2023-05-14 ENCOUNTER — Other Ambulatory Visit: Payer: Self-pay | Admitting: Pharmacist

## 2023-05-14 MED ORDER — BICTEGRAVIR-EMTRICITAB-TENOFOV 50-200-25 MG PO TABS: 1.0000 | ORAL_TABLET | Freq: Every day | ORAL | Status: DC

## 2023-05-14 NOTE — Progress Notes (Signed)
 Medication Samples have been provided to the patient.  Drug name: Biktarvy        Strength: 50/200/25 mg       Qty: 14 tablets (2 bottles) LOT: [id number]   Exp.Date: 6/27  Dosing instructions: Take one tablet by mouth once daily  The patient has been instructed regarding the correct time, dose, and frequency of taking this medication, including desired effects and most common side effects.   Margarite Gouge, PharmD, CPP, BCIDP, AAHIVP Clinical Pharmacist Practitioner Infectious Diseases Clinical Pharmacist [facility]

## 2023-05-18 ENCOUNTER — Encounter: Payer: Self-pay | Admitting: Physician Assistant

## 2023-05-18 ENCOUNTER — Ambulatory Visit: Payer: 59 | Attending: Physician Assistant | Admitting: Physician Assistant

## 2023-05-18 VITALS — BP 130/80 | HR 81 | Ht 66.0 in | Wt 188.2 lb

## 2023-05-18 DIAGNOSIS — I480 Paroxysmal atrial fibrillation: Secondary | ICD-10-CM

## 2023-05-18 DIAGNOSIS — I10 Essential (primary) hypertension: Secondary | ICD-10-CM

## 2023-05-18 DIAGNOSIS — E785 Hyperlipidemia, unspecified: Secondary | ICD-10-CM

## 2023-05-18 MED ORDER — METOPROLOL SUCCINATE ER 25 MG PO TB24: 25.0000 mg | ORAL_TABLET | Freq: Every day | ORAL | 2 refills | Status: DC

## 2023-05-18 NOTE — Patient Instructions (Signed)
 Medication Instructions:  Your physician has recommended you make the following change in your medication:   1) START metoprolol succinate (Toprol XL) 25 mg daily  *If you need a refill on your cardiac medications before your next appointment, please call your pharmacy*  Lab Work: At next office visit (come in fasting): Lipid panel If you have labs (blood work) drawn today and your tests are completely normal, you will receive your results only by: MyChart Message (if you have MyChart) OR A paper copy in the mail If you have any lab test that is abnormal or we need to change your treatment, we will call you to review the results.  Follow-Up: At [facility] you and your health needs are our priority.  As part of our continuing mission to provide you with exceptional heart care, we have created designated Provider Care Teams.  These Care Teams include your primary Cardiologist (physician) and Advanced Practice Providers (APPs -  Physician Assistants and Nurse Practitioners) who all work together to provide you with the care you need, when you need it.  Your next appointment:   1 month(s) morning appointment, come fasting for labs  The format for your next appointment:   In Person  Provider:   Jacolyn Reedy, PA-C  Other Instructions   1st Floor: - Lobby - Registration  - Pharmacy  - Lab - Cafe  2nd Floor: - PV Lab - Diagnostic Testing (echo, CT, nuclear med)  3rd Floor: - Vacant  4th Floor: - TCTS (cardiothoracic surgery) - AFib Clinic - Structural Heart Clinic - Vascular Surgery  - Vascular Ultrasound  5th Floor: - HeartCare Cardiology (general and EP) - Clinical Pharmacy for coumadin, hypertension, lipid, weight-loss medications, and med management appointments    Valet parking services will be available as well.

## 2023-05-19 ENCOUNTER — Encounter: Payer: 59 | Admitting: Internal Medicine

## 2023-05-19 NOTE — Progress Notes (Deleted)
 Subjective:  CC: hospital follow-up  HPI:  [name] is a 63 y.o. female with a past medical history of HIV, HTN, hyperthyroidism, paroxysmal A fib who presents today for hospital follow-up.  She was admitted to the hospital from 2/21->23 in setting of GI bleed.  She presented with progressive melena and hematochezia over 2 to 3 months.  She was found to be severely severely anemic with hemoglobin of 6 at [facility] clinic appointment with prior baseline of 11-12.  She received 2 units of PRBCs and hemoglobin increased appropriately.  Of note surgical pathology resulted positive for H. pylori gastritis.  Dr. Leonides Schanz with GI started bismuth quadruple therapy with tetracycline 500 mg twice daily for 14 days, Flagyl 250 4 times daily for 14 days, bismuth 4 times daily for 14 days and PPI for 14 days.  Please see problem based assessment and plan for additional details.  Past Medical History:  Diagnosis Date   Allergy    HIV infection (HCC)    Hyperlipidemia    Hypertension    Insomnia 12/19/2014    MEDICATIONS:  Atorvastatin 10 mg Biktarvy Lisinopril-HCTZ 10/12.5 mg daily- HELD AT discharge Iron supplement Xarelto 20 mg Metoprolol succinate 25 mg daily Trazodone 50 mg Pepto-Bismol Pantoprazole 40 mg twice daily  Family History  Problem Relation Age of Onset   Cancer Mother        uterine vs ovarian?   Seizures Father    Mental illness Brother    Breast cancer Maternal Aunt    Diabetes Daughter    Colon polyps Neg Hx    Esophageal cancer Neg Hx    Rectal cancer Neg Hx    Stomach cancer Neg Hx    Thyroid disease Neg Hx     Past Surgical History:  Procedure Laterality Date   BIOPSY  05/01/2023   Procedure: BIOPSY;  Surgeon: Imogene Burn, MD;  Location: [facility] ENDOSCOPY;  Service: Gastroenterology;;   Broken Arm Left    Pins placed in arm   ESOPHAGOGASTRODUODENOSCOPY (EGD) WITH PROPOFOL N/A 05/01/2023   Procedure: ESOPHAGOGASTRODUODENOSCOPY (EGD) WITH PROPOFOL;   Surgeon: Imogene Burn, MD;  Location: [facility] ENDOSCOPY;  Service: Gastroenterology;  Laterality: N/A;   TUBAL LIGATION       Social History   Socioeconomic History   Marital status: Single    Spouse name: Not on file   Number of children: Not on file   Years of education: Not on file   Highest education level: Not on file  Occupational History   Not on file  Tobacco Use   Smoking status: Former    Current packs/day: 0.00    Average packs/day: 2.0 packs/day for 20.0 years (40.0 ttl pk-yrs)    Types: Cigarettes    Start date: 24    Quit date: 2004    Years since quitting: 21.2   Smokeless tobacco: Never   Tobacco comments:    20 years ago  Vaping Use   Vaping status: Never Used  Substance and Sexual Activity   Alcohol use: Not Currently    Alcohol/week: 6.0 standard drinks of alcohol    Types: 6 Cans of beer per week   Drug use: Yes    Frequency: 0.5 times per week    Types: Marijuana   Sexual activity: Not Currently    Partners: Male    Birth control/protection: Condom    Comment: declined condoms 05/11/22  Other Topics Concern   Not on file  Social History Narrative  Not on file   Social Drivers of Health   Financial Resource Strain: Not on file  Food Insecurity: No Food Insecurity (04/30/2023)   Hunger Vital Sign    Worried About Running Out of Food in the Last Year: Never true    Ran Out of Food in the Last Year: Never true  Transportation Needs: No Transportation Needs (04/30/2023)   PRAPARE - Administrator, Civil Service (Medical): No    Lack of Transportation (Non-Medical): No  Physical Activity: Not on file  Stress: Not on file  Social Connections: Not on file  Intimate Partner Violence: Not At Risk (04/30/2023)   Humiliation, Afraid, Rape, and Kick questionnaire    Fear of Current or Ex-Partner: No    Emotionally Abused: No    Physically Abused: No    Sexually Abused: No    Review of Systems: ROS negative except for what is noted on the  assessment and plan.  Objective:  There were no vitals filed for this visit.  Physical Exam: Constitutional: well-appearing *** sitting in ***, in no acute distress HENT: normocephalic atraumatic, mucous membranes moist Eyes: conjunctiva non-erythematous Neck: supple Cardiovascular: regular rate and rhythm, no m/r/g Pulmonary/Chest: normal work of breathing on room air, lungs clear to auscultation bilaterally Abdominal: soft, non-tender, non-distended MSK: normal bulk and tone Neurological: alert & oriented x 3, 5/5 strength in bilateral upper and lower extremities, normal gait Skin: warm and dry Psych: ***     Assessment & Plan:  No problem-specific Assessment & Plan notes found for this encounter.   Melena Continue pantoprazole 40 mg twice daily for 10 weeks.  End date of May 4.  Also started on new iron at discharge.  H. pylori   Hypertension Blood pressures were normotensive during hospital stay.  Her combination pill with lisinopril HCTZ 10-12.5 mg was held at discharge.     Patient discussed with Dr. Marjorie Smolder [name], D.O. [facility] Internal Medicine  PGY-3 Pager: [phone]  Phone: [phone] Date 05/19/2023  Time 3:34 PM

## 2023-05-26 ENCOUNTER — Ambulatory Visit (INDEPENDENT_AMBULATORY_CARE_PROVIDER_SITE_OTHER): Payer: 59 | Admitting: Family

## 2023-05-26 ENCOUNTER — Encounter: Payer: Self-pay | Admitting: Family

## 2023-05-26 ENCOUNTER — Other Ambulatory Visit (HOSPITAL_COMMUNITY): Payer: Self-pay

## 2023-05-26 ENCOUNTER — Other Ambulatory Visit: Payer: Self-pay

## 2023-05-26 VITALS — BP 135/95 | HR 97 | Temp 96.5°F | Ht 66.0 in | Wt 185.0 lb

## 2023-05-26 DIAGNOSIS — K922 Gastrointestinal hemorrhage, unspecified: Secondary | ICD-10-CM

## 2023-05-26 DIAGNOSIS — B20 Human immunodeficiency virus [HIV] disease: Secondary | ICD-10-CM

## 2023-05-26 DIAGNOSIS — E785 Hyperlipidemia, unspecified: Secondary | ICD-10-CM | POA: Diagnosis not present

## 2023-05-26 DIAGNOSIS — Z0000 Encounter for general adult medical examination without abnormal findings: Secondary | ICD-10-CM

## 2023-05-26 MED ORDER — BIKTARVY 50-200-25 MG PO TABS: 1.0000 | ORAL_TABLET | Freq: Every day | ORAL | 5 refills | Status: DC

## 2023-05-26 NOTE — Assessment & Plan Note (Signed)
 Discussed importance of safe sexual practice and condom use. Condoms and site specific STD testing offered.  Vaccinations reviewed - recommend Shingrix and RSV.  Colon cancer, breast cancer, and cervical cancer screenings are up to date.  Dental appointment scheduled for next week.

## 2023-05-26 NOTE — Patient Instructions (Addendum)
 Nice to see you. ? ?We will check your lab work today. ? ?Continue to take your medication daily as prescribed. ? ?Refills have been sent to the pharmacy. ? ?Plan for follow up in 6 months or sooner if needed with lab work on the same day. ? ?Have a great day and stay safe! ? ?

## 2023-05-26 NOTE — Assessment & Plan Note (Signed)
 Appears stable. Has follow up with GI. Will check CBC.

## 2023-05-26 NOTE — Assessment & Plan Note (Signed)
 Ms. Bateson continues to have well-controlled virus with good adherence and tolerance to [place].  Reviewed previous lab work and discussed plan of care and U equals U.  Met with financial counselor to assist with resolving medication issues.  Check blood work.  Social determinants of health reviewed with no interventions indicated.  Continue current dose of Biktarvy.  Plan for follow-up in 6 months or sooner if needed with lab work on the same day.

## 2023-05-26 NOTE — Progress Notes (Signed)
 Brief Narrative   Patient ID: Vicki Brown, female    DOB: [birth year], 63 y.o.   MRN: [id number]  Vicki Brown is a 63 y/o AA female diagnosed with HIV-1 disease in March of 2009 with risk factor of heterosexual contact. Initial viral load was 338,000 with CD4 count 462. Entered care at [facility] Stage 2. No Genotype data available. No history of opportunistic infection. ART experienced with Atripla and currently Biktarvy.   Subjective:    Chief Complaint  Patient presents with   HIV Positive/AIDS    HPI:  Vicki Brown is a 63 y.o. female with HIV disease last seen on 11/16/2022 with well-controlled virus and good adherence and tolerance to [employer].  Viral load was undetectable with CD4 count 856.  Kidney function, liver function, electrolytes within normal ranges.  Most recent lab work completed. Was admitted in late February for GI bleed and symptomatic anemia. Here today for follow up.   Vicki Brown has been doing well since leaving the hospital and since her last office visit and continues to take [facility] daily as prescribed with no adverse side effects.  She has been having difficulty obtaining her medication from the pharmacy with a co-pay of $900.  Has Nurse, learning disability through [place] and was on ICAP.  She will meet with financial counseling following appointment.  Having bilateral knee pain of unclear origin that waxes and wanes and described as stiff at times.  Left side worse than the right.  No trauma or injury.  Continues to work at the [employer].  Housing and access to food are stable.  Transportation via personal vehicle.  Condoms and site-specific STD testing offered.  Healthcare maintenance reviewed.  Denies fevers, chills, night sweats, headaches, changes in vision, neck pain/stiffness, nausea, diarrhea, vomiting, lesions or rashes.  Lab Results  Component Value Date   CD4TCELL 59 11/16/2022   CD4TABS 856 11/16/2022   Lab Results  Component Value Date    HIV1RNAQUANT Not Detected 11/16/2022     No Known Allergies    Outpatient Medications Prior to Visit  Medication Sig Dispense Refill   atorvastatin (LIPITOR) 10 MG tablet TAKE 4 TABLETS EVERY DAY 30 tablet 1   bismuth subsalicylate (PEPTO BISMOL) 262 MG/15ML suspension Take 30 mLs by mouth every 6 (six) hours as needed.     diclofenac Sodium (VOLTAREN ARTHRITIS PAIN) 1 % GEL Apply 2 g topically 4 (four) times daily. 50 g 0   iron polysaccharides (POLY-IRON 150) 150 MG capsule Take 1 capsule (150 mg total) by mouth daily. 30 capsule 2   lisinopril-hydrochlorothiazide (ZESTORETIC) 10-12.5 MG tablet Take 1 tablet by mouth daily. 90 tablet 3   metoprolol succinate (TOPROL XL) 25 MG 24 hr tablet Take 1 tablet (25 mg total) by mouth daily. 30 tablet 2   pantoprazole (PROTONIX) 40 MG tablet Take 1 tablet (40 mg total) by mouth 2 (two) times daily. 140 tablet 0   rivaroxaban (XARELTO) 20 MG TABS tablet TAKE 1 TABLET BY MOUTH DAILY WITH SUPPER. 90 tablet 1   traZODone (DESYREL) 50 MG tablet Take 0.5 tablets (25 mg total) by mouth at bedtime. 30 tablet 3   Vitamin D, Ergocalciferol, 50000 units CAPS Take 1 capsule by mouth once a week. 8 capsule 0   bictegravir-emtricitabine-tenofovir AF (BIKTARVY) 50-200-25 MG TABS tablet Take 1 tablet by mouth daily. 30 tablet 5   bictegravir-emtricitabine-tenofovir AF (BIKTARVY) 50-200-25 MG TABS tablet Take 1 tablet by mouth daily for 14 days.  No facility-administered medications prior to visit.     Past Medical History:  Diagnosis Date   Allergy    HIV infection (HCC)    Hyperlipidemia    Hypertension    Insomnia 12/19/2014     Past Surgical History:  Procedure Laterality Date   BIOPSY  05/01/2023   Procedure: BIOPSY;  Surgeon: Imogene Burn, MD;  Location: [facility] ENDOSCOPY;  Service: Gastroenterology;;   Broken Arm Left    Pins placed in arm   ESOPHAGOGASTRODUODENOSCOPY (EGD) WITH PROPOFOL N/A 05/01/2023   Procedure: ESOPHAGOGASTRODUODENOSCOPY  (EGD) WITH PROPOFOL;  Surgeon: Imogene Burn, MD;  Location: [facility] ENDOSCOPY;  Service: Gastroenterology;  Laterality: N/A;   TUBAL LIGATION        Review of Systems  Constitutional:  Negative for appetite change, chills, diaphoresis, fatigue, fever and unexpected weight change.  Eyes:        Negative for acute change in vision  Respiratory:  Negative for chest tightness, shortness of breath and wheezing.   Cardiovascular:  Negative for chest pain.  Gastrointestinal:  Negative for diarrhea, nausea and vomiting.  Genitourinary:  Negative for dysuria, pelvic pain and vaginal discharge.  Musculoskeletal:  Negative for neck pain and neck stiffness.  Skin:  Negative for rash.  Neurological:  Negative for seizures, syncope, weakness and headaches.  Hematological:  Negative for adenopathy. Does not bruise/bleed easily.  Psychiatric/Behavioral:  Negative for hallucinations.       Objective:    BP (!) 135/95   Pulse 97   Temp (!) 96.5 F (35.8 C) (Temporal)   Ht 5\' 6"  (1.676 m)   Wt 185 lb (83.9 kg)   BMI 29.86 kg/m  Nursing note and vital signs reviewed.  Physical Exam Constitutional:      General: She is not in acute distress.    Appearance: She is well-developed.  Eyes:     Conjunctiva/sclera: Conjunctivae normal.  Cardiovascular:     Rate and Rhythm: Normal rate and regular rhythm.     Heart sounds: Normal heart sounds. No murmur heard.    No friction rub. No gallop.  Pulmonary:     Effort: Pulmonary effort is normal. No respiratory distress.     Breath sounds: Normal breath sounds. No wheezing or rales.  Chest:     Chest wall: No tenderness.  Abdominal:     General: Bowel sounds are normal.     Palpations: Abdomen is soft.     Tenderness: There is no abdominal tenderness.  Musculoskeletal:     Cervical back: Neck supple.  Lymphadenopathy:     Cervical: No cervical adenopathy.  Skin:    General: Skin is warm and dry.     Findings: No rash.  Neurological:      Mental Status: She is alert and oriented to person, place, and time.  Psychiatric:        Behavior: Behavior normal.        Thought Content: Thought content normal.        Judgment: Judgment normal.         05/26/2023    1:18 PM 04/30/2023   10:04 AM 11/16/2022    1:49 PM 05/11/2022    2:49 PM 12/12/2021    9:55 AM  Depression screen PHQ 2/9  Decreased Interest 0 0 0 0 0  Down, Depressed, Hopeless 0 0 0 0 0  PHQ - 2 Score 0 0 0 0 0       Assessment & Plan:    Patient Active Problem  List   Diagnosis Date Noted   Dizziness 04/30/2023   GI bleed 04/30/2023   Symptomatic anemia 04/30/2023   Hearing loss 12/15/2021   Left arm pain 02/17/2021   Iron deficiency anemia 08/08/2020   Healthcare maintenance 08/07/2020   Fatigue 07/12/2020   Screening for cervical cancer 02/21/2020   Multinodular goiter 06/01/2019   Elevated liver function tests 04/22/2018   Laryngopharyngeal reflux (LPR) 02/24/2018   Hyperthyroidism 01/27/2018   Hoarseness 01/27/2018   Atrial fibrillation (HCC) 10/21/2017   Hyperlipidemia 10/21/2017   Insomnia 12/19/2014   Perennial allergic rhinitis 12/28/2012   Dyslipidemia 07/31/2010   Lower abdominal pain 07/02/2008   Human immunodeficiency virus (HIV) disease (HCC) 01/19/2008   PERIPHERAL NEUROPATHY 01/19/2008   Essential hypertension 01/19/2008     Problem List Items Addressed This Visit       Digestive   GI bleed   Appears stable. Has follow up with GI. Will check CBC.        Other   Human immunodeficiency virus (HIV) disease (HCC) - Primary (Chronic)   Ms. Zapien continues to have well-controlled virus with good adherence and tolerance to [employer].  Reviewed previous lab work and discussed plan of care and U equals U.  Met with financial counselor to assist with resolving medication issues.  Check blood work.  Social determinants of health reviewed with no interventions indicated.  Continue current dose of Biktarvy.  Plan for follow-up in 6 months  or sooner if needed with lab work on the same day.      Relevant Medications   bictegravir-emtricitabine-tenofovir AF (BIKTARVY) 50-200-25 MG TABS tablet   Other Relevant Orders   COMPLETE METABOLIC PANEL WITH GFR   CBC   HIV-1 RNA quant-no reflex-bld   T-helper cell (CD4)- ([facility] clinic only)   Hyperlipidemia   Healthcare maintenance   Discussed importance of safe sexual practice and condom use. Condoms and site specific STD testing offered.  Vaccinations reviewed - recommend Shingrix and RSV.  Colon cancer, breast cancer, and cervical cancer screenings are up to date.  Dental appointment scheduled for next week.         I have discontinued Karianna L. Kimbrough's bictegravir-emtricitabine-tenofovir AF. I am also having her maintain her traZODone, Vitamin D (Ergocalciferol), [Paused] lisinopril-hydrochlorothiazide, diclofenac Sodium, atorvastatin, Xarelto, pantoprazole, iron polysaccharides, bismuth subsalicylate, metoprolol succinate, and Biktarvy.   Meds ordered this encounter  Medications   bictegravir-emtricitabine-tenofovir AF (BIKTARVY) 50-200-25 MG TABS tablet    Sig: Take 1 tablet by mouth daily.    Dispense:  30 tablet    Refill:  5    Supervising Provider:   Judyann Munson [[id number]]     Follow-up: Return in about 6 months (around 11/26/2023). or sooner if needed.    Marcos Eke, MSN, FNP-C Nurse Practitioner [facility] Main number: [phone]

## 2023-05-27 ENCOUNTER — Other Ambulatory Visit: Payer: Self-pay | Admitting: Pharmacist

## 2023-05-27 DIAGNOSIS — B20 Human immunodeficiency virus [HIV] disease: Secondary | ICD-10-CM

## 2023-05-27 MED ORDER — BICTEGRAVIR-EMTRICITAB-TENOFOV 50-200-25 MG PO TABS: 1.0000 | ORAL_TABLET | Freq: Every day | ORAL | Status: AC

## 2023-05-27 NOTE — Progress Notes (Signed)
 Medication Samples have been provided to the patient.  Drug name: Biktarvy        Strength: 50/200/25 mg       Qty: 14 tablets (2 bottles) LOT: [id number]   Exp.Date: 6/27  Samples requested by Diminique Pricilla Loveless and Marcos Eke.  Dosing instructions: Take one tablet by mouth once daily  The patient has been instructed regarding the correct time, dose, and frequency of taking this medication, including desired effects and most common side effects.   Margarite Gouge, PharmD, CPP, BCIDP, AAHIVP Clinical Pharmacist Practitioner Infectious Diseases Clinical Pharmacist [facility]

## 2023-05-28 LAB — COMPLETE METABOLIC PANEL WITH GFR
AG Ratio: 1.2 (calc) (ref 1.0–2.5)
ALT: 15 U/L (ref 6–29)
AST: 19 U/L (ref 10–35)
Albumin: 3.7 g/dL (ref 3.6–5.1)
Alkaline phosphatase (APISO): 69 U/L (ref 37–153)
BUN/Creatinine Ratio: 12 (calc) (ref 6–22)
BUN: 13 mg/dL (ref 7–25)
CO2: 28 mmol/L (ref 20–32)
Calcium: 9.2 mg/dL (ref 8.6–10.4)
Chloride: 106 mmol/L (ref 98–110)
Creat: 1.07 mg/dL — ABNORMAL HIGH (ref 0.50–1.05)
Globulin: 3.1 g/dL (ref 1.9–3.7)
Glucose, Bld: 109 mg/dL — ABNORMAL HIGH (ref 65–99)
Potassium: 4.2 mmol/L (ref 3.5–5.3)
Sodium: 139 mmol/L (ref 135–146)
Total Bilirubin: 0.2 mg/dL (ref 0.2–1.2)
Total Protein: 6.8 g/dL (ref 6.1–8.1)

## 2023-05-28 LAB — CBC
HCT: 32.3 % — ABNORMAL LOW (ref 35.0–45.0)
Hemoglobin: 9.6 g/dL — ABNORMAL LOW (ref 11.7–15.5)
MCH: 24.9 pg — ABNORMAL LOW (ref 27.0–33.0)
MCHC: 29.7 g/dL — ABNORMAL LOW (ref 32.0–36.0)
MCV: 83.9 fL (ref 80.0–100.0)
MPV: 9.8 fL (ref 7.5–12.5)
Platelets: 275 10*3/uL (ref 140–400)
RBC: 3.85 10*6/uL (ref 3.80–5.10)
RDW: 20.6 % — ABNORMAL HIGH (ref 11.0–15.0)
WBC: 3.3 10*3/uL — ABNORMAL LOW (ref 3.8–10.8)

## 2023-05-28 LAB — HIV-1 RNA QUANT-NO REFLEX-BLD
HIV 1 RNA Quant: NOT DETECTED {copies}/mL
HIV-1 RNA Quant, Log: NOT DETECTED {Log_copies}/mL

## 2023-05-28 LAB — T-HELPER CELL (CD4) - (RCID CLINIC ONLY)
CD4 % Helper T Cell: 57 % (ref 33–65)
CD4 T Cell Abs: 718 /uL (ref 400–1790)

## 2023-06-03 ENCOUNTER — Ambulatory Visit: Payer: Self-pay

## 2023-06-03 ENCOUNTER — Other Ambulatory Visit: Payer: Self-pay

## 2023-06-07 NOTE — Progress Notes (Signed)
 Cardiology Office Note:  .   Date:  06/15/2023  ID:  Vicki Brown, DOB 1960/08/11, MRN [id number] PCP: Philomena Doheny, MD  [facility] Providers Cardiologist:  Lesleigh Noe, MD (Inactive)    History of Present Illness: Vicki Brown is a 63 y.o. female   with a history of paroxysmal atrial fibrillation, hypertension, hyperlipidemia, and HIV.Stress test in 2019 was low risk. Echocardiogram in 2021 showed EF 60 to 65%, normal LV function, no RWMA.     Patient discharged 05/02/23 after admission for GI bleed Hbg 6.0 -up to 7.9 at discharge. Lisinopril/hydrochlorothiazide held due to low BP.   I saw the patient 05/18/23. Wasn't taking metoprolol for years and complaining of palpitations. Toprol xl 25 mg daily added. Lisinopril and hydrochlorothiazide held since GI bleed.  Patient comes in for f/u. Palpitations improved on Toprol. 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 No DOE, following low salt diet, does most her own cooking.     ROS:    Studies Reviewed: [name]         Prior CV Studies:     Echo 07/05/2019 1. Left ventricular ejection fraction, by estimation, is 60 to 65%. The  left ventricle has normal function. The left ventricle has no regional  wall motion abnormalities. Left ventricular diastolic parameters were  normal.   2. Right ventricular systolic function is normal. The right ventricular  size is normal. There is normal pulmonary artery systolic pressure.   3. The mitral valve is normal in structure. Trivial mitral valve  regurgitation. No evidence of mitral stenosis.   4. The aortic valve is tricuspid. Aortic valve regurgitation is trivial.  Mild aortic valve sclerosis is present, with no evidence of aortic valve  stenosis.   5. The inferior vena cava is normal in size with greater than 50%  respiratory variability, suggesting right atrial pressure of 3 mmHg.    Stress test 10/2017 Nuclear stress EF:  72%. The left ventricular ejection fraction is hyperdynamic (>65%). There was no ST segment deviation noted during stress. The study is normal. This is a low risk study.   Normal resting and stress perfusion. No ischemia or infarction EF 72%   Risk Assessment/Calculations:    CHA2DS2-VASc Score = 2   This indicates a 2.2% annual risk of stroke. The patient's score is based upon: CHF History: 0 HTN History: 1 Diabetes History: 0 Stroke History: 0 Vascular Disease History: 0 Age Score: 0 Gender Score: 1             Physical Exam:   VS:  BP 120/84 (BP Location: Right Arm, Patient Position: Sitting, Cuff Size: Normal)   Pulse 76   Resp 16   Ht 5\' 6"  (1.676 m)   Wt 184 lb (83.5 kg)   SpO2 97%   BMI 29.70 kg/m    Wt Readings from Last 3 Encounters:  06/15/23 184 lb (83.5 kg)  05/26/23 185 lb (83.9 kg)  05/18/23 188 lb 3.2 oz (85.4 kg)    GEN: Well nourished, well developed in no acute distress NECK: No JVD; No carotid bruits CARDIAC:  RRR, some skipping, no murmurs, rubs, gallops RESPIRATORY:  Clear to auscultation without rales, wheezing or rhonchi  ABDOMEN: Soft, non-tender, non-distended EXTREMITIES:  No edema; No deformity   ASSESSMENT AND PLAN: .    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 No swelling on exam today but she works nights. Will Rx hydrochlorothiazide 12.5 mg daily prn. Check bmet in 1-2 weeks. Check echo.   HLD-LDL 119 03/16/22 on lipitor. Due for repeat labs but not fasting today. Will check at next OV.        Dispo: f/u after echo.  Signed, Jacolyn Reedy, PA-C

## 2023-06-15 ENCOUNTER — Encounter: Payer: Self-pay | Admitting: Physician Assistant

## 2023-06-15 ENCOUNTER — Ambulatory Visit: Attending: Physician Assistant | Admitting: Physician Assistant

## 2023-06-15 VITALS — BP 120/84 | HR 76 | Resp 16 | Ht 66.0 in | Wt 184.0 lb

## 2023-06-15 DIAGNOSIS — I480 Paroxysmal atrial fibrillation: Secondary | ICD-10-CM | POA: Diagnosis not present

## 2023-06-15 DIAGNOSIS — I10 Essential (primary) hypertension: Secondary | ICD-10-CM

## 2023-06-15 DIAGNOSIS — E7849 Other hyperlipidemia: Secondary | ICD-10-CM

## 2023-06-15 DIAGNOSIS — R600 Localized edema: Secondary | ICD-10-CM | POA: Diagnosis not present

## 2023-06-15 MED ORDER — HYDROCHLOROTHIAZIDE 12.5 MG PO CAPS: 12.5000 mg | ORAL_CAPSULE | Freq: Every day | ORAL | 3 refills | Status: AC

## 2023-06-15 NOTE — Patient Instructions (Signed)
 Medication Instructions:  Your physician has recommended you make the following change in your medication:  START HYDROCHLOROTHIAZIDE 12.5 MG DAILY  *If you need a refill on your cardiac medications before your next appointment, please call your pharmacy*  Lab Work: TO BE DONE IN 2 WEEKS: BMET, CBC If you have labs (blood work) drawn today and your tests are completely normal, you will receive your results only by: MyChart Message (if you have MyChart) OR A paper copy in the mail If you have any lab test that is abnormal or we need to change your treatment, we will call you to review the results.  Testing/Procedures: Your physician has requested that you have an echocardiogram. Echocardiography is a painless test that uses sound waves to create images of your heart. It provides your doctor with information about the size and shape of your heart and how well your heart's chambers and valves are working. This procedure takes approximately one hour. There are no restrictions for this procedure. Please do NOT wear cologne, perfume, aftershave, or lotions (deodorant is allowed). Please arrive 15 minutes prior to your appointment time.  Please note: We ask at that you not bring children with you during ultrasound (echo/ vascular) testing. Due to room size and safety concerns, children are not allowed in the ultrasound rooms during exams. Our front office staff cannot provide observation of children in our lobby area while testing is being conducted. An adult accompanying a patient to their appointment will only be allowed in the ultrasound room at the discretion of the ultrasound technician under special circumstances. We apologize for any inconvenience.   Follow-Up: At [facility], you and your health needs are our priority.  As part of our continuing mission to provide you with exceptional heart care, our providers are all part of one team.  This team includes your primary Cardiologist  (physician) and Advanced Practice Providers or APPs (Physician Assistants and Nurse Practitioners) who all work together to provide you with the care you need, when you need it.  Your next appointment:   4 month(s)  Provider:   Jacolyn Reedy, PA-C        We recommend signing up for the patient portal called "MyChart".  Sign up information is provided on this After Visit Summary.  MyChart is used to connect with patients for Virtual Visits (Telemedicine).  Patients are able to view lab/test results, encounter notes, upcoming appointments, etc.  Non-urgent messages can be sent to your provider as well.   To learn more about what you can do with MyChart, go to [email or web address].   Other Instructions YOUR PROVIDER RECOMMENDS THAT YOU DRINK AT LEAST 64 OZ OF WATER DAILY.       1st Floor: - Lobby - Registration  - Pharmacy  - Lab - Cafe  2nd Floor: - PV Lab - Diagnostic Testing (echo, CT, nuclear med)  3rd Floor: - Vacant  4th Floor: - TCTS (cardiothoracic surgery) - AFib Clinic - Structural Heart Clinic - Vascular Surgery  - Vascular Ultrasound  5th Floor: - [facility]Care Cardiology (general and EP) - Clinical Pharmacy for coumadin, hypertension, lipid, weight-loss medications, and med management appointments    Valet parking services will be available as well.

## 2023-07-02 ENCOUNTER — Other Ambulatory Visit: Payer: Self-pay | Admitting: Family Medicine

## 2023-07-06 ENCOUNTER — Encounter: Payer: Self-pay | Admitting: Gastroenterology

## 2023-07-06 ENCOUNTER — Other Ambulatory Visit (INDEPENDENT_AMBULATORY_CARE_PROVIDER_SITE_OTHER)

## 2023-07-06 ENCOUNTER — Ambulatory Visit (INDEPENDENT_AMBULATORY_CARE_PROVIDER_SITE_OTHER): Payer: 59 | Admitting: Gastroenterology

## 2023-07-06 VITALS — BP 122/70 | HR 81 | Ht 66.0 in | Wt 183.0 lb

## 2023-07-06 DIAGNOSIS — K269 Duodenal ulcer, unspecified as acute or chronic, without hemorrhage or perforation: Secondary | ICD-10-CM

## 2023-07-06 DIAGNOSIS — D500 Iron deficiency anemia secondary to blood loss (chronic): Secondary | ICD-10-CM

## 2023-07-06 DIAGNOSIS — K449 Diaphragmatic hernia without obstruction or gangrene: Secondary | ICD-10-CM

## 2023-07-06 LAB — IBC + FERRITIN
Ferritin: 4 ng/mL — ABNORMAL LOW (ref 10.0–291.0)
Iron: 33 ug/dL — ABNORMAL LOW (ref 42–145)
Saturation Ratios: 6.5 % — ABNORMAL LOW (ref 20.0–50.0)
TIBC: 506.8 ug/dL — ABNORMAL HIGH (ref 250.0–450.0)
Transferrin: 362 mg/dL — ABNORMAL HIGH (ref 212.0–360.0)

## 2023-07-06 LAB — CBC WITH DIFFERENTIAL/PLATELET
Basophils Absolute: 0 10*3/uL (ref 0.0–0.1)
Basophils Relative: 1.4 % (ref 0.0–3.0)
Eosinophils Absolute: 0 10*3/uL (ref 0.0–0.7)
Eosinophils Relative: 1.4 % (ref 0.0–5.0)
HCT: 31.2 % — ABNORMAL LOW (ref 36.0–46.0)
Hemoglobin: 10 g/dL — ABNORMAL LOW (ref 12.0–15.0)
Lymphocytes Relative: 41.3 % (ref 12.0–46.0)
Lymphs Abs: 1.1 10*3/uL (ref 0.7–4.0)
MCHC: 32 g/dL (ref 30.0–36.0)
MCV: 83.4 fl (ref 78.0–100.0)
Monocytes Absolute: 0.2 10*3/uL (ref 0.1–1.0)
Monocytes Relative: 6.3 % (ref 3.0–12.0)
Neutro Abs: 1.3 10*3/uL — ABNORMAL LOW (ref 1.4–7.7)
Neutrophils Relative %: 49.6 % (ref 43.0–77.0)
Platelets: 253 10*3/uL (ref 150.0–400.0)
RBC: 3.75 Mil/uL — ABNORMAL LOW (ref 3.87–5.11)
RDW: 24.6 % — ABNORMAL HIGH (ref 11.5–15.5)
WBC: 2.7 10*3/uL — ABNORMAL LOW (ref 4.0–10.5)

## 2023-07-06 NOTE — Patient Instructions (Signed)
 Your provider has requested that you go to the basement level for lab work before leaving today. Press "B" on the elevator. The lab is located at the first door on the left as you exit the elevator.  Due to recent changes in healthcare laws, you may see the results of your imaging and laboratory studies on MyChart before your provider has had a chance to review them.  We understand that in some cases there may be results that are confusing or concerning to you. Not all laboratory results come back in the same time frame and the provider may be waiting for multiple results in order to interpret others.  Please give us  48 hours in order for your provider to thoroughly review all the results before contacting the office for clarification of your results.   _______________________________________________________  If your blood pressure at your visit was 140/90 or greater, please contact your primary care physician to follow up on this.  _______________________________________________________  If you are age 65 or older, your body mass index should be between 23-30. Your Body mass index is 29.54 kg/m. If this is out of the aforementioned range listed, please consider follow up with your Primary Care Provider.  If you are age 61 or younger, your body mass index should be between 19-25. Your Body mass index is 29.54 kg/m. If this is out of the aformentioned range listed, please consider follow up with your Primary Care Provider.   ________________________________________________________  The [facility] providers would like to encourage you to use MYCHART to communicate with providers for non-urgent requests or questions.  Due to long hold times on the telephone, sending your provider a message by [facility] may be a faster and more efficient way to get a response.  Please allow 48 business hours for a response.  Please remember that this is for non-urgent requests.   _______________________________________________________  Thank you for choosing me and [facility].  Dr. Lorella Roles

## 2023-07-06 NOTE — Progress Notes (Signed)
 [facility] Consult Note:  History: Vicki Brown 07/06/2023  Referring provider: Arellano Zameza, Cynthea Drier, MD  Reason for consult/chief complaint: IDA [facility] f/u, denies any bleeding, feeling good)   Subjective  Prior history:  Screening and surveillance colonoscopies with Dr. Dominic Friendly 2018 (6 mm SSP) and 2023 (left and right side diverticulosis, internal hemorrhoids, no polyps) Hospitalized February 2025 for iron  deficiency anemia with reported melena on OAC for A-fib.  Also reporting dysphagia, thought at least in part to insufficient chewing as well as esophageal stenosis found on EGD by Dr. Rosaline Coma. EGD by Dr. Rosaline Coma with esophageal stenosis (scope passed, no dilation performed), "large" hiatal hernia, nonspecific duodenal erosions.  Patient received 2 units PRBCs and IV iron . HIV, A-fib and other medical issues as noted below  Discussed the use of AI scribe software for clinical note transcription with the patient, who gave verbal consent to proceed.  History of Present Illness Vicki Brown is a 63 year old female with anemia and atrial fibrillation who presents for follow-up after a recent hospital stay for anemia.  As noted above, she was hospitalized due to severe anemia, and she confirms that she was having black stool at that time as well.  During her hospital stay, she received blood transfusions and intravenous iron .   She is not currently using oral iron  supplements. No ongoing melena, which was present during her hospital stay and associated with bleeding. She is currently on Xarelto  for atrial fibrillation, which was prescribed due to heart palpitations, and she adheres to this medication regularly.  No difficulty swallowing or stomach pain at present. She occasionally (perhaps twice a week) takes Aleve  for knee and leg problems but not on a daily basis.  She is taking her Xarelto  and all other medicines listed above.  No change in bowel habits, no  bright red blood per rectum.  ROS:  Review of Systems  Constitutional:  Negative for appetite change and unexpected weight change.  HENT:  Negative for mouth sores and voice change.   Eyes:  Negative for pain and redness.  Respiratory:  Negative for cough and shortness of breath.   Cardiovascular:  Negative for chest pain and palpitations.  Genitourinary:  Negative for dysuria and hematuria.  Musculoskeletal:  Positive for arthralgias. Negative for myalgias.  Skin:  Negative for pallor and rash.  Neurological:  Negative for weakness and headaches.  Hematological:  Negative for adenopathy.     Past Medical History: Past Medical History:  Diagnosis Date   Allergy    Anemia    Atrial fibrillation (HCC)    Duodenal ulcer    Gastritis    GI bleed    upper   HIV infection (HCC)    Hyperlipidemia    Hypertension    Insomnia 12/19/2014     Past Surgical History: Past Surgical History:  Procedure Laterality Date   BIOPSY  05/01/2023   Procedure: BIOPSY;  Surgeon: Daina Drum, MD;  Location: [facility] ENDOSCOPY;  Service: Gastroenterology;;   Broken Arm Left    Pins placed in arm   ESOPHAGOGASTRODUODENOSCOPY (EGD) WITH PROPOFOL  N/A 05/01/2023   Procedure: ESOPHAGOGASTRODUODENOSCOPY (EGD) WITH PROPOFOL ;  Surgeon: Daina Drum, MD;  Location: [facility] ENDOSCOPY;  Service: Gastroenterology;  Laterality: N/A;   TUBAL LIGATION       Family History: Family History  Problem Relation Age of Onset   Cancer Mother        uterine vs ovarian?   Seizures Father    Mental illness  Brother    Breast cancer Maternal Aunt    Diabetes Daughter    Colon polyps Neg Hx    Esophageal cancer Neg Hx    Rectal cancer Neg Hx    Stomach cancer Neg Hx    Thyroid  disease Neg Hx     Social History: Social History   Socioeconomic History   Marital status: Single    Spouse name: Not on file   Number of children: 2   Years of education: Not on file   Highest education level: Not on file   Occupational History   Not on file  Tobacco Use   Smoking status: Former    Current packs/day: 0.00    Average packs/day: 0.3 packs/day for 20.0 years (6.0 ttl pk-yrs)    Types: Cigarettes    Start date: 4    Quit date: 2004    Years since quitting: 21.3   Smokeless tobacco: Never   Tobacco comments:    20 years ago  Vaping Use   Vaping status: Never Used  Substance and Sexual Activity   Alcohol use: Not Currently    Alcohol/week: 6.0 standard drinks of alcohol    Types: 6 Cans of beer per week   Drug use: Not Currently    Frequency: 0.5 times per week    Types: Marijuana   Sexual activity: Not Currently    Partners: Male    Birth control/protection: Condom    Comment: declined condoms  Other Topics Concern   Not on file  Social History Narrative   Not on file   Social Drivers of Health   Financial Resource Strain: Not on file  Food Insecurity: No Food Insecurity (04/30/2023)   Hunger Vital Sign    Worried About Running Out of Food in the Last Year: Never true    Ran Out of Food in the Last Year: Never true  Transportation Needs: No Transportation Needs (04/30/2023)   PRAPARE - Administrator, Civil Service (Medical): No    Lack of Transportation (Non-Medical): No  Physical Activity: Not on file  Stress: Not on file  Social Connections: Not on file   Current smoker  Allergies: No Known Allergies  Outpatient Meds: Current Outpatient Medications  Medication Sig Dispense Refill   atorvastatin  (LIPITOR) 10 MG tablet TAKE 4 TABLETS EVERY DAY 30 tablet 1   bictegravir-emtricitabine -tenofovir  AF (BIKTARVY ) 50-200-25 MG TABS tablet Take 1 tablet by mouth daily. 30 tablet 5   bismuth  subsalicylate (PEPTO BISMOL) 262 MG/15ML suspension Take 30 mLs by mouth every 6 (six) hours as needed.     diclofenac  Sodium (VOLTAREN  ARTHRITIS PAIN) 1 % GEL Apply 2 g topically 4 (four) times daily. 50 g 0   hydrochlorothiazide  (MICROZIDE ) 12.5 MG capsule Take 1 capsule  (12.5 mg total) by mouth daily. 90 capsule 3   metoprolol  succinate (TOPROL  XL) 25 MG 24 hr tablet Take 1 tablet (25 mg total) by mouth daily. 30 tablet 2   pantoprazole  (PROTONIX ) 40 MG tablet Take 1 tablet (40 mg total) by mouth 2 (two) times daily. 140 tablet 0   rivaroxaban  (XARELTO ) 20 MG TABS tablet TAKE 1 TABLET BY MOUTH DAILY WITH SUPPER. 90 tablet 1   traZODone  (DESYREL ) 50 MG tablet Take 0.5 tablets (25 mg total) by mouth at bedtime. 30 tablet 3   Vitamin D , Ergocalciferol , 50000 units CAPS Take 1 capsule by mouth once a week. 8 capsule 0   [Paused] lisinopril -hydrochlorothiazide  (ZESTORETIC ) 10-12.5 MG tablet Take 1 tablet by mouth  daily. (Patient not taking: Reported on 07/06/2023) 90 tablet 3   No current facility-administered medications for this visit.      ___________________________________________________________________ Objective   Exam:  BP 122/70   Pulse 81   Ht 5\' 6"  (1.676 m)   Wt 183 lb (83 kg)   BMI 29.54 kg/m  Wt Readings from Last 3 Encounters:  07/06/23 183 lb (83 kg)  06/15/23 184 lb (83.5 kg)  05/26/23 185 lb (83.9 kg)    General: Well-appearing, normal vocal quality Eyes: sclera anicteric, no redness ENT: oral mucosa moist without lesions, no cervical or supraclavicular lymphadenopathy.  Good dentition CV: Regular without appreciable murmur, no JVD, no peripheral edema Resp: clear to auscultation bilaterally, normal RR and effort noted GI: soft, no tenderness, with active bowel sounds. No guarding or palpable organomegaly noted. Skin; warm and dry, no rash or jaundice noted Neuro: awake, alert and oriented x 3. Normal gross motor function and fluent speech   Labs:     Latest Ref Rng & Units 05/26/2023    1:46 PM 05/02/2023    7:32 AM 05/01/2023    6:58 PM  CBC  WBC 3.8 - 10.8 Thousand/uL 3.3  3.5  4.2   Hemoglobin 11.7 - 15.5 g/dL 9.6  7.9  8.0   Hematocrit 35.0 - 45.0 % 32.3  25.9  25.1   Platelets 140 - 400 Thousand/uL 275  294  308     Admission iron  levels February 2025 showed iron  of 15 and ferritin of 2.  Iron  levels not repeated with follow-up labs March 2025     Latest Ref Rng & Units 05/26/2023    1:46 PM 05/02/2023    7:32 AM 05/01/2023    6:17 AM  CMP  Glucose 65 - 99 mg/dL 161  096  95   BUN 7 - 25 mg/dL 13  7  10    Creatinine 0.50 - 1.05 mg/dL 0.45  4.09  8.11   Sodium 135 - 146 mmol/L 139  138  138   Potassium 3.5 - 5.3 mmol/L 4.2  3.8  4.1   Chloride 98 - 110 mmol/L 106  105  105   CO2 20 - 32 mmol/L 28  27  19    Calcium  8.6 - 10.4 mg/dL 9.2  9.1  8.7   Total Protein 6.1 - 8.1 g/dL 6.8     Total Bilirubin 0.2 - 1.2 mg/dL 0.2     AST 10 - 35 U/L 19     ALT 6 - 29 U/L 15        Encounter Diagnoses  Name Primary?   Iron  deficiency anemia due to chronic blood loss Yes   Hiatal hernia    Duodenal erosion     Assessment and Plan Assessment & Plan  Anemia of chronic blood loss Anemia with improved hemoglobin post-transfusion and IV iron . Black stools resolved.  She reportedly had a large hiatal hernia, though is not measured in centimeters on the report and I cannot quite tell its approximate size from the available photographs.  While no specific "Cameron erosions" were noted on that, those are common finding of IDA and can be hard to spot.  Nevertheless, those would not be expected to cause overt bleeding such as melena.  She was reportedly having dysphagia at that time but says she currently has no difficulty swallowing.  She has learned to cut and chew her food well and consume plenty of fluids with meals.  This stricture noted in the esophagus  looks more like some EG junction tortuosity based on the photos I have seen.  So no current plans for repeat upper endoscopy for stricture dilation.  Source of the bleeding not entirely clear, and could have been more distal small bowel source, less likely colon source would last colonoscopy 2023.  Unless she has recurrence of melena or has recurrence of IDA  after adequately repleting her iron  (which probably has not yet been done), video capsule study may not be needed.  Should that become necessary, it would have to be done at the hospital for endoscopic delivery given the EGD findings with sizable hiatal hernia and EG junction anatomy.   Plan:  CBC and iron  studies today  Oral iron  supplement will be recommended based on results.  Then we can also plan interval lab follow-up for the anemia.  Thank you for the courtesy of this consult.  Please call me with any questions or concerns.   35 minutes were spent on this encounter, including in depth chart review, independent review of results as outlined above, communicating results with the patient directly, face-to-face time with the patient, coordinating care, ordering studies and medications as appropriate, and documentation.     Kerby Pearson III  CC: Referring provider noted above

## 2023-07-12 ENCOUNTER — Telehealth: Payer: Self-pay | Admitting: Gastroenterology

## 2023-07-12 NOTE — Telephone Encounter (Signed)
 Inbound call from patient returning phone call regarding recent results. Please advise, thank you.

## 2023-07-14 NOTE — Telephone Encounter (Signed)
 See results note.

## 2023-07-14 NOTE — Telephone Encounter (Signed)
 Patient returning call.

## 2023-07-16 ENCOUNTER — Ambulatory Visit (HOSPITAL_COMMUNITY): Attending: Cardiology

## 2023-07-16 ENCOUNTER — Other Ambulatory Visit: Payer: Self-pay

## 2023-07-16 DIAGNOSIS — D509 Iron deficiency anemia, unspecified: Secondary | ICD-10-CM

## 2023-07-16 MED ORDER — IRON (FERROUS SULFATE) 325 (65 FE) MG PO TABS: 325.0000 mg | ORAL_TABLET | Freq: Two times a day (BID) | ORAL | 2 refills | Status: AC

## 2023-07-20 ENCOUNTER — Encounter (HOSPITAL_COMMUNITY): Payer: Self-pay | Admitting: Physician Assistant

## 2023-07-22 ENCOUNTER — Other Ambulatory Visit: Payer: Self-pay | Admitting: Infectious Diseases

## 2023-07-22 NOTE — Telephone Encounter (Signed)
 Okay to refill? Wanted to make sure due to directions.

## 2023-07-24 ENCOUNTER — Other Ambulatory Visit: Payer: Self-pay | Admitting: Family Medicine

## 2023-07-27 ENCOUNTER — Ambulatory Visit: Payer: Self-pay | Admitting: Student

## 2023-08-05 NOTE — Progress Notes (Signed)
 The 10-year ASCVD risk score (Arnett DK, et al., 2019) is: 7.3%   Values used to calculate the score:     Age: 63 years     Sex: Female     Is Non-Hispanic African American: Yes     Diabetic: No     Tobacco smoker: No     Systolic Blood Pressure: 122 mmHg     Is BP treated: Yes     HDL Cholesterol: 71 mg/dL     Total Cholesterol: 206 mg/dL  Currently prescribed atorvastatin  10 mg.  Shavette Shoaff, BSN, RN

## 2023-08-12 ENCOUNTER — Other Ambulatory Visit: Payer: Self-pay | Admitting: Physician Assistant

## 2023-08-24 ENCOUNTER — Ambulatory Visit: Payer: Self-pay

## 2023-08-25 ENCOUNTER — Other Ambulatory Visit (INDEPENDENT_AMBULATORY_CARE_PROVIDER_SITE_OTHER)

## 2023-08-25 DIAGNOSIS — D509 Iron deficiency anemia, unspecified: Secondary | ICD-10-CM | POA: Diagnosis not present

## 2023-08-25 LAB — CBC WITH DIFFERENTIAL/PLATELET
Basophils Absolute: 0 10*3/uL (ref 0.0–0.1)
Basophils Relative: 0.4 % (ref 0.0–3.0)
Eosinophils Absolute: 0.1 10*3/uL (ref 0.0–0.7)
Eosinophils Relative: 2.2 % (ref 0.0–5.0)
HCT: 37 % (ref 36.0–46.0)
Hemoglobin: 11.9 g/dL — ABNORMAL LOW (ref 12.0–15.0)
Lymphocytes Relative: 49.9 % — ABNORMAL HIGH (ref 12.0–46.0)
Lymphs Abs: 1.2 10*3/uL (ref 0.7–4.0)
MCHC: 32.2 g/dL (ref 30.0–36.0)
MCV: 90.9 fl (ref 78.0–100.0)
Monocytes Absolute: 0.2 10*3/uL (ref 0.1–1.0)
Monocytes Relative: 6.8 % (ref 3.0–12.0)
Neutro Abs: 1 10*3/uL — ABNORMAL LOW (ref 1.4–7.7)
Neutrophils Relative %: 40.7 % — ABNORMAL LOW (ref 43.0–77.0)
Platelets: 193 10*3/uL (ref 150.0–400.0)
RBC: 4.07 Mil/uL (ref 3.87–5.11)
RDW: 19.9 % — ABNORMAL HIGH (ref 11.5–15.5)
WBC: 2.5 10*3/uL — ABNORMAL LOW (ref 4.0–10.5)

## 2023-08-25 LAB — IBC + FERRITIN
Ferritin: 17.9 ng/mL (ref 10.0–291.0)
Iron: 65 ug/dL (ref 42–145)
Saturation Ratios: 17.1 % — ABNORMAL LOW (ref 20.0–50.0)
TIBC: 380.8 ug/dL (ref 250.0–450.0)
Transferrin: 272 mg/dL (ref 212.0–360.0)

## 2023-08-26 ENCOUNTER — Telehealth: Payer: Self-pay | Admitting: Gastroenterology

## 2023-08-26 ENCOUNTER — Ambulatory Visit: Payer: Self-pay | Admitting: Gastroenterology

## 2023-08-26 DIAGNOSIS — D509 Iron deficiency anemia, unspecified: Secondary | ICD-10-CM

## 2023-08-26 NOTE — Telephone Encounter (Signed)
 Patient calling back regarding results. Please advise  Thank you

## 2023-08-26 NOTE — Telephone Encounter (Signed)
 Attempted to reach patient to discuss lab results. No answer. LVM. Will call patient tomorrow.

## 2023-08-27 NOTE — Telephone Encounter (Signed)
 Will continue documentation in result notes. Closing this encounter.

## 2023-09-23 ENCOUNTER — Other Ambulatory Visit: Payer: Self-pay | Admitting: Infectious Diseases

## 2023-09-23 DIAGNOSIS — Z1231 Encounter for screening mammogram for malignant neoplasm of breast: Secondary | ICD-10-CM

## 2023-10-04 ENCOUNTER — Ambulatory Visit

## 2023-10-04 ENCOUNTER — Other Ambulatory Visit: Payer: Self-pay

## 2023-10-25 ENCOUNTER — Ambulatory Visit
Admission: RE | Admit: 2023-10-25 | Discharge: 2023-10-25 | Disposition: A | Discharge status: Home / Self Care | Source: Ambulatory Visit | Attending: Infectious Diseases | Admitting: Infectious Diseases

## 2023-10-25 DIAGNOSIS — Z1231 Encounter for screening mammogram for malignant neoplasm of breast: Secondary | ICD-10-CM

## 2023-11-15 ENCOUNTER — Other Ambulatory Visit: Payer: Self-pay

## 2023-11-15 ENCOUNTER — Ambulatory Visit: Admitting: Family

## 2023-11-15 ENCOUNTER — Encounter: Payer: Self-pay | Admitting: Family

## 2023-11-15 VITALS — BP 125/78 | HR 76 | Temp 97.5°F | Ht 66.0 in | Wt 192.0 lb

## 2023-11-15 DIAGNOSIS — Z23 Encounter for immunization: Secondary | ICD-10-CM

## 2023-11-15 DIAGNOSIS — M79602 Pain in left arm: Secondary | ICD-10-CM | POA: Diagnosis not present

## 2023-11-15 DIAGNOSIS — Z79899 Other long term (current) drug therapy: Secondary | ICD-10-CM | POA: Diagnosis not present

## 2023-11-15 DIAGNOSIS — B20 Human immunodeficiency virus [HIV] disease: Secondary | ICD-10-CM | POA: Diagnosis not present

## 2023-11-15 DIAGNOSIS — Z0000 Encounter for general adult medical examination without abnormal findings: Secondary | ICD-10-CM

## 2023-11-15 MED ORDER — DICLOFENAC SODIUM 1 % EX GEL: 2.0000 g | Freq: Four times a day (QID) | CUTANEOUS | 0 refills | Status: DC

## 2023-11-15 MED ORDER — BIKTARVY 50-200-25 MG PO TABS: 1.0000 | ORAL_TABLET | Freq: Every day | ORAL | 5 refills | Status: AC

## 2023-11-15 NOTE — Assessment & Plan Note (Signed)
 Acute onset left shoulder appearing consistent with tendinitis and suspected is likely related to repetitive motions at work.  Treat conservatively with ice every 2 hours and after activity along with home exercise therapy and diclofenac  gel.  If symptoms worsen or do not improve recommend additional workup through orthopedics or sports medicine.

## 2023-11-15 NOTE — Progress Notes (Signed)
 Brief Narrative   Patient ID: Vicki Brown, female    DOB: 06-05-1960, 63 y.o.   MRN: [id number]  Vicki Brown is a 63 y/o AA female diagnosed with HIV-1 disease in March of 2009 with risk factor of heterosexual contact. Initial viral load was 338,000 with CD4 count 462. Entered care at [facility] Stage 2. No Genotype data available. No history of opportunistic infection. ART experienced with Atripla and currently Biktarvy .     Subjective:   Chief Complaint  Patient presents with   Follow-up    B20; left shoulder pain    HPI:  Vicki Brown is a 63 y.o. female with HIV disease last seen on 05/26/2023 with well-controlled virus and good adherence and tolerance to Biktarvy .  Viral load was undetectable with CD4 count 718.  Kidney function, liver function, electrolytes within normal ranges.  Noted to have anemia with hemoglobin of 9.6. Here today for routine follow up.  Vicki Brown has been doing well since her last office visit and continues to take Biktarvy  as prescribed with no adverse side effects or problems obtaining medication from the pharmacy.  Covered by [employer].  Has concerns over left sided shoulder pain going on for approximately 2 weeks in the absence of trauma/injury or sounds/sensations heard/felt.  Quality of the pain is unable to be described with severity being terrible.  Range of motion limited in abduction and internal rotation.  Has been refractory to ibuprofen .  No neck pain and has occasional numbness and tingling.  She is right-hand dominant and works as a Advertising copywriter.  Vicki Brown housing, transportation, and access to food are stable.  Healthcare maintenance reviewed.  Condoms and site-specific STD testing offered.  Routine dental care up-to-date.  Denies fevers, chills, night sweats, headaches, changes in vision, neck pain/stiffness, nausea, diarrhea, vomiting, lesions or rashes.  Lab Results  Component Value Date   CD4TCELL 57 05/26/2023   CD4TABS 718  05/26/2023   Lab Results  Component Value Date   HIV1RNAQUANT NOT DETECTED 05/26/2023     No Known Allergies    Outpatient Medications Prior to Visit  Medication Sig Dispense Refill   atorvastatin  (LIPITOR) 10 MG tablet TAKE 4 TABLETS EVERY DAY 30 tablet 11   hydrochlorothiazide  (MICROZIDE ) 12.5 MG capsule Take 1 capsule (12.5 mg total) by mouth daily. 90 capsule 3   Iron , Ferrous Sulfate , 325 (65 Fe) MG TABS Take 325 mg by mouth in the morning and at bedtime. 60 tablet 2   metoprolol  succinate (TOPROL -XL) 25 MG 24 hr tablet TAKE 1 TABLET (25 MG TOTAL) BY MOUTH DAILY. 90 tablet 1   pantoprazole  (PROTONIX ) 40 MG tablet Take 1 tablet (40 mg total) by mouth 2 (two) times daily. 140 tablet 0   rivaroxaban  (XARELTO ) 20 MG TABS tablet TAKE 1 TABLET BY MOUTH DAILY WITH SUPPER. 90 tablet 1   traZODone  (DESYREL ) 50 MG tablet Take 0.5 tablets (25 mg total) by mouth at bedtime. 30 tablet 3   Vitamin D , Ergocalciferol , 50000 units CAPS Take 1 capsule by mouth once a week. 8 capsule 0   bictegravir-emtricitabine -tenofovir  AF (BIKTARVY ) 50-200-25 MG TABS tablet Take 1 tablet by mouth daily. 30 tablet 5   diclofenac  Sodium (VOLTAREN  ARTHRITIS PAIN) 1 % GEL Apply 2 g topically 4 (four) times daily. 50 g 0   bismuth  subsalicylate (PEPTO BISMOL) 262 MG/15ML suspension Take 30 mLs by mouth every 6 (six) hours as needed. (Patient not taking: Reported on 11/15/2023)     lisinopril -hydrochlorothiazide  (ZESTORETIC )  10-12.5 MG tablet Take 1 tablet by mouth daily. (Patient not taking: Reported on 11/15/2023) 90 tablet 3   No facility-administered medications prior to visit.     Past Medical History:  Diagnosis Date   Allergy    Anemia    Atrial fibrillation (HCC)    Duodenal ulcer    Gastritis    GI bleed    upper   HIV infection (HCC)    Hyperlipidemia    Hypertension    Insomnia 12/19/2014     Past Surgical History:  Procedure Laterality Date   BIOPSY  05/01/2023   Procedure: BIOPSY;  Surgeon:  Federico Rosario BROCKS, MD;  Location: [facility] ENDOSCOPY;  Service: Gastroenterology;;   Broken Arm Left    Pins placed in arm   ESOPHAGOGASTRODUODENOSCOPY (EGD) WITH PROPOFOL  N/A 05/01/2023   Procedure: ESOPHAGOGASTRODUODENOSCOPY (EGD) WITH PROPOFOL ;  Surgeon: Federico Rosario BROCKS, MD;  Location: [facility] ENDOSCOPY;  Service: Gastroenterology;  Laterality: N/A;   TUBAL LIGATION          Review of Systems  Constitutional:  Negative for appetite change, chills, diaphoresis, fatigue, fever and unexpected weight change.  Eyes:        Negative for acute change in vision  Respiratory:  Negative for chest tightness, shortness of breath and wheezing.   Cardiovascular:  Negative for chest pain.  Gastrointestinal:  Negative for diarrhea, nausea and vomiting.  Genitourinary:  Negative for dysuria, pelvic pain and vaginal discharge.  Musculoskeletal:  Negative for neck pain and neck stiffness.       Left shoulder pain  Skin:  Negative for rash.  Neurological:  Negative for seizures, syncope, weakness and headaches.  Hematological:  Negative for adenopathy. Does not bruise/bleed easily.  Psychiatric/Behavioral:  Negative for hallucinations.      Objective:   BP 125/78   Pulse 76   Temp (!) 97.5 F (36.4 C) (Temporal)   Ht 5' 6 (1.676 m)   Wt 192 lb (87.1 kg)   SpO2 98%   BMI 30.99 kg/m  Nursing note and vital signs reviewed.  Physical Exam Constitutional:      General: She is not in acute distress.    Appearance: She is well-developed.  Cardiovascular:     Rate and Rhythm: Normal rate and regular rhythm.     Heart sounds: Normal heart sounds.  Pulmonary:     Effort: Pulmonary effort is normal.     Breath sounds: Normal breath sounds.  Musculoskeletal:     Comments: Left shoulder with no obvious deformity, discoloration, or edema.  Palpable tenderness over long head of biceps tendon and anterior shoulder.  Range of motion limited in adduction and internal rotation.  Pain elicited with shoulder flexion  with palm up.  Skin:    General: Skin is warm and dry.  Neurological:     Mental Status: She is alert and oriented to person, place, and time.          05/26/2023    1:18 PM 04/30/2023   10:04 AM 11/16/2022    1:49 PM 05/11/2022    2:49 PM 12/12/2021    9:55 AM  Depression screen PHQ 2/9  Decreased Interest 0 0 0 0 0  Down, Depressed, Hopeless 0 0 0 0 0  PHQ - 2 Score 0 0 0 0 0         No data to display           The 10-year ASCVD risk score (Arnett DK, et al., 2019) is: 8.4%  Values used to calculate the score:     Age: 19 years     Clincally relevant sex: Female     Is Non-Hispanic African American: Yes     Diabetic: No     Tobacco smoker: No     Systolic Blood Pressure: 125 mmHg     Is BP treated: Yes     HDL Cholesterol: 71 mg/dL     Total Cholesterol: 206 mg/dL      Assessment & Plan:    Patient Active Problem List   Diagnosis Date Noted   Dizziness 04/30/2023   GI bleed 04/30/2023   Symptomatic anemia 04/30/2023   Hearing loss 12/15/2021   Left arm pain 02/17/2021   Iron  deficiency anemia 08/08/2020   Healthcare maintenance 08/07/2020   Fatigue 07/12/2020   Screening for cervical cancer 02/21/2020   Multinodular goiter 06/01/2019   Elevated liver function tests 04/22/2018   Laryngopharyngeal reflux (LPR) 02/24/2018   Hyperthyroidism 01/27/2018   Hoarseness 01/27/2018   Atrial fibrillation (HCC) 10/21/2017   Hyperlipidemia 10/21/2017   Insomnia 12/19/2014   Perennial allergic rhinitis 12/28/2012   Dyslipidemia 07/31/2010   Lower abdominal pain 07/02/2008   Human immunodeficiency virus (HIV) disease (HCC) 01/19/2008   PERIPHERAL NEUROPATHY 01/19/2008   Essential hypertension 01/19/2008     Problem List Items Addressed This Visit       Other   Human immunodeficiency virus (HIV) disease (HCC) - Primary (Chronic)   Vicki Brown continues to have well-controlled virus with good adherence and tolerance to Biktarvy .  Reviewed previous lab work and  discussed plan of care and U equals U.  No problems obtaining medication from the pharmacy and covered by [place] [employer].  Social determinants of health reviewed with no interventions indicated.  Check blood work.  Continue current dose of Biktarvy .  Plan for follow-up in 6 months or sooner if needed with lab work on the same day.      Relevant Medications   bictegravir-emtricitabine -tenofovir  AF (BIKTARVY ) 50-200-25 MG TABS tablet   Other Relevant Orders   Comprehensive metabolic panel with GFR   HIV-1 RNA quant-no reflex-bld   T-helper cell (CD4)- ([facility] clinic only)   Healthcare maintenance   Discussed importance of safe sexual practice and condom use. Condoms and site specific STD testing offered.  Vaccinations reviewed and following counseling will update influenza. Routine dental care up-to-date. Colon cancer, breast cancer, and cervical cancer screenings up-to-date.      Left arm pain   Acute onset left shoulder appearing consistent with tendinitis and suspected is likely related to repetitive motions at work.  Treat conservatively with ice every 2 hours and after activity along with home exercise therapy and diclofenac  gel.  If symptoms worsen or do not improve recommend additional workup through orthopedics or sports medicine.      Other Visit Diagnoses       Pharmacologic therapy       Relevant Orders   Lipid Profile     Encounter for immunization       Relevant Orders   Flu vaccine HIGH DOSE PF(Fluzone Trivalent) (Completed)        I am having Vicki Brown maintain her traZODone , Vitamin D  (Ergocalciferol ), [Paused] lisinopril -hydrochlorothiazide , Xarelto , pantoprazole , bismuth  subsalicylate, hydrochlorothiazide , Iron  (Ferrous Sulfate ), atorvastatin , metoprolol  succinate, Biktarvy , and diclofenac  Sodium.   Meds ordered this encounter  Medications   bictegravir-emtricitabine -tenofovir  AF (BIKTARVY ) 50-200-25 MG TABS tablet    Sig: Take 1 tablet by mouth daily.     Dispense:  30 tablet  Refill:  5    Supervising Provider:   LUIZ CHANNEL [phone]    Prescription Type::   Renewal   diclofenac  Sodium (VOLTAREN  ARTHRITIS PAIN) 1 % GEL    Sig: Apply 2 g topically 4 (four) times daily.    Dispense:  50 g    Refill:  0    Supervising Provider:   LUIZ CHANNEL [[id number]]     Follow-up: Return in about 6 months (around 05/14/2024). or sooner if needed.    Cathlyn July, MSN, FNP-C Nurse Practitioner [facility] Main number: [phone]

## 2023-11-15 NOTE — Patient Instructions (Addendum)
 Nice to see you.  We will check your lab work today.  Continue to take your medication daily as prescribed.  Refills have been sent to the pharmacy.  Plan for follow up in 6  months or sooner if needed with lab work on the same day.  Have a great day and stay safe!  [facility] [phone]  [facility] [phone]

## 2023-11-15 NOTE — Assessment & Plan Note (Signed)
 Vicki Brown continues to have well-controlled virus with good adherence and tolerance to Biktarvy .  Reviewed previous lab work and discussed plan of care and U equals U.  No problems obtaining medication from the pharmacy and covered by [place] [employer].  Social determinants of health reviewed with no interventions indicated.  Check blood work.  Continue current dose of Biktarvy .  Plan for follow-up in 6 months or sooner if needed with lab work on the same day.

## 2023-11-15 NOTE — Assessment & Plan Note (Signed)
 Discussed importance of safe sexual practice and condom use. Condoms and site specific STD testing offered.  Vaccinations reviewed and following counseling will update influenza. Routine dental care up-to-date. Colon cancer, breast cancer, and cervical cancer screenings up-to-date.

## 2023-11-16 LAB — T-HELPER CELL (CD4) - (RCID CLINIC ONLY)
CD4 % Helper T Cell: 62 % (ref 33–65)
CD4 T Cell Abs: 664 /uL (ref 400–1790)

## 2023-11-17 LAB — COMPREHENSIVE METABOLIC PANEL WITH GFR
AG Ratio: 1.3 (calc) (ref 1.0–2.5)
ALT: 21 U/L (ref 6–29)
AST: 25 U/L (ref 10–35)
Albumin: 4.1 g/dL (ref 3.6–5.1)
Alkaline phosphatase (APISO): 85 U/L (ref 37–153)
BUN: 13 mg/dL (ref 7–25)
CO2: 28 mmol/L (ref 20–32)
Calcium: 9.6 mg/dL (ref 8.6–10.4)
Chloride: 104 mmol/L (ref 98–110)
Creat: 0.93 mg/dL (ref 0.50–1.05)
Globulin: 3.1 g/dL (ref 1.9–3.7)
Glucose, Bld: 96 mg/dL (ref 65–99)
Potassium: 4 mmol/L (ref 3.5–5.3)
Sodium: 139 mmol/L (ref 135–146)
Total Bilirubin: 0.4 mg/dL (ref 0.2–1.2)
Total Protein: 7.2 g/dL (ref 6.1–8.1)
eGFR: 68 mL/min/1.73m2 (ref >=60)

## 2023-11-17 LAB — HIV-1 RNA QUANT-NO REFLEX-BLD
HIV 1 RNA Quant: NOT DETECTED {copies}/mL
HIV-1 RNA Quant, Log: NOT DETECTED {Log_copies}/mL

## 2023-11-17 LAB — LIPID PANEL
Cholesterol: 149 mg/dL (ref <200)
HDL: 59 mg/dL (ref >=50)
LDL Cholesterol (Calc): 75 mg/dL
Non-HDL Cholesterol (Calc): 90 mg/dL (ref <130)
Total CHOL/HDL Ratio: 2.5 (calc) (ref <5.0)
Triglycerides: 74 mg/dL (ref <150)

## 2023-11-18 ENCOUNTER — Ambulatory Visit: Payer: Self-pay | Admitting: Family

## 2023-11-18 NOTE — Telephone Encounter (Signed)
-----   Message from Cordella July sent at 11/18/2023  1:28 PM EDT ----- Please inform Ms. Utsey that her lab work looks good. Viral load was undetectable and CD4 count 664. Kidney function, liver function and electrolytes were normal. Cholesterol number look good as well.  Thanks! ----- Message ----- From: Rebecka Hose Lab Results In Sent: 11/15/2023  11:15 PM EDT To: Cordella JONETTA July, FNP

## 2023-11-18 NOTE — Telephone Encounter (Signed)
 Patient aware

## 2023-12-22 ENCOUNTER — Other Ambulatory Visit (HOSPITAL_COMMUNITY): Payer: Self-pay

## 2023-12-27 ENCOUNTER — Other Ambulatory Visit: Payer: Self-pay | Admitting: Nurse Practitioner

## 2023-12-27 DIAGNOSIS — I4811 Longstanding persistent atrial fibrillation: Secondary | ICD-10-CM

## 2023-12-28 NOTE — Telephone Encounter (Signed)
 Prescription refill request for Xarelto  received.  Indication: PAF Last office visit: 06/15/23  CHRISTELLA Pavy PA-C Weight: 83.5kg Age: 63 Scr: 0.93 on 11/15/23  Epic CrCl: 79.50  Based on above findings Xarelto  20mg  daily is the appropriate dose.  Refill approved.

## 2024-01-25 ENCOUNTER — Encounter: Payer: Self-pay | Admitting: Infectious Diseases

## 2024-01-25 ENCOUNTER — Ambulatory Visit: Admitting: Infectious Diseases

## 2024-01-25 ENCOUNTER — Other Ambulatory Visit: Payer: Self-pay

## 2024-01-25 VITALS — BP 132/89 | HR 76 | Temp 97.6°F | Ht 66.0 in | Wt 197.0 lb

## 2024-01-25 DIAGNOSIS — G8929 Other chronic pain: Secondary | ICD-10-CM

## 2024-01-25 DIAGNOSIS — M25512 Pain in left shoulder: Secondary | ICD-10-CM | POA: Diagnosis not present

## 2024-01-25 DIAGNOSIS — Z124 Encounter for screening for malignant neoplasm of cervix: Secondary | ICD-10-CM

## 2024-01-25 MED ORDER — MELOXICAM 15 MG PO TABS: 15.0000 mg | ORAL_TABLET | Freq: Every day | ORAL | 0 refills | Status: DC

## 2024-01-25 MED ORDER — MELOXICAM 7.5 MG PO TABS: 7.5000 mg | ORAL_TABLET | Freq: Every day | ORAL | 0 refills | Status: DC

## 2024-01-25 MED ORDER — PREDNISONE 10 MG PO TABS: 10.0000 mg | ORAL_TABLET | Freq: Every day | ORAL | 0 refills | Status: AC

## 2024-01-25 NOTE — Progress Notes (Signed)
 SUBJECTIVE    Vicki Brown is a 63 y.o. female here for cervical cancer screening and pap smear.   Discussed the use of AI scribe software for clinical note transcription with the patient, who gave verbal consent to proceed.  History of Present Illness   Vicki Brown is a 63 year old female who presents for a follow-up Pap smear.  Her last Pap smear was in December 2021, which was normal. Previous Pap smears in 2018, 2017, and 2016 were also normal.  She has been experiencing shoulder pain for about a month. The pain is localized around the shoulder area and is associated with weakness. She has been using ice packs and taking Advil  and Aleve , but these have not provided relief. She reports difficulty with certain movements, such as reaching up and moving the arm back, and notes that the pain is 'all around here'.  She is currently taking hydrochlorothiazide  for blood pressure management. She was previously on a combination of lisinopril  and hydrochlorothiazide , but this was changed following a stomach bleed and low blood pressure episode. She also takes metoprolol  for blood pressure and heart rate control. She recalls a recent 90-day fill for metoprolol .         Past Medical History:  Diagnosis Date   Allergy    Anemia    Atrial fibrillation (HCC)    Duodenal ulcer    Gastritis    GI bleed    upper   HIV infection (HCC)    Hyperlipidemia    Hypertension    Insomnia 12/19/2014    Gynecologic History: G2P0  No LMP recorded. Patient is postmenopausal. Contraception: post menopausal status Last Pap: . Results were: normal Last Mammogram: 10/29/2023. Results were: normal    Objective  Physical Exam - chaperone present  Constitutional: Well developed, well nourished, no acute distress. She is alert and oriented x3.  Pelvic: External genitalia is normal in appearance. The vagina is normal in appearance. The cervix is bulbous and easily visualized. No CMT, normal  expected cervical mucus present.  MSK: left shoulder slightly limited ROM but can raise arm over head and across body. No painful arc. Tenderness with palpation over posterior shoulder musculature.  Psych: She has a normal mood and affect.     Vitals:   01/25/24 1456  BP: 132/89  Pulse: 76  Temp: 97.6 F (36.4 C)  Height: 5' 6 (1.676 m)  Weight: 197 lb (89.4 kg)  SpO2: 95%  TempSrc: Temporal  BMI (Calculated): 31.81       Assessment & Plan:      Routine gynecological examination with Pap smear - Routine follow-up Pap smear. Previous Pap smears in 2021, 2018, 2017, and 2016 were normal. - Performed Pap smear - Will update her with results when available  Left shoulder pain likely due to tendinitis or bursitis - Left shoulder pain for approximately one month, likely due to tendinitis or bursitis. Pain is associated with weakness and restricted movement. Differential diagnosis includes tendon irritation or bursa inflammation. Gout is unlikely due to lack of typical symptoms such as acute pain and fever. - Prescribed short course prednisone  once daily for inflammation (avoiding NSAIDS for h/o GIB) - Continue using ice packs for 15 minutes before bed - Referred to sports medicine for further evaluation and possible ultrasound if symptoms persist  Essential hypertension - Managed with metoprolol . Previous combination therapy with lisinopril  and hydrochlorothiazide  was discontinued due to low blood pressure and gastrointestinal bleeding. Current regimen  includes metoprolol  and hydrochlorothiazide  as needed for leg swelling. Blood pressure is well-controlled on current regimen. - Continue metoprolol  for blood pressure management - Use hydrochlorothiazide  as needed for leg swelling  History of gastrointestinal bleeding - Gastrointestinal bleeding led to discontinuation of lisinopril  and hydrochlorothiazide . Avoid NSAIDs for chronic shoulder management       Orders Placed This  Encounter  Procedures   AMB referral to sports medicine    Referral Priority:   Routine    Referral Type:   Consultation    Number of Visits Requested:   1    Meds ordered this encounter  Medications   DISCONTD: meloxicam  (MOBIC ) 15 MG tablet    Sig: Take 1 tablet (15 mg total) by mouth daily.    Dispense:  30 tablet    Refill:  0   DISCONTD: meloxicam  (MOBIC ) 7.5 MG tablet    Sig: Take 1 tablet (7.5 mg total) by mouth daily.    Dispense:  30 tablet    Refill:  0   predniSONE  (DELTASONE ) 10 MG tablet    Sig: Take 1 tablet (10 mg total) by mouth daily with breakfast for 7 days.    Dispense:  7 tablet    Refill:  0     Vicki Fireman, MSN, NP-C [facility]  [email or web address] Pager: [phone] Office: [phone] [facility] Main Line: [phone] *Secure Chat Communication Welcome   I personally spent a total of 26 minutes in the care of the patient today including preparing to see the patient, getting/reviewing separately obtained history, performing a medically appropriate exam/evaluation, documenting clinical information in the EHR, independently interpreting results, and detailed review of hospitalization surrounding GIB encounters from cardiology and discharge summary in addition to outpatient cardiology notes to help her understand medication changes r/t BP management.

## 2024-01-25 NOTE — Patient Instructions (Addendum)
 VISIT SUMMARY:  Today, you had a follow-up appointment for a routine Pap smear and discussed your shoulder pain, blood pressure management, and history of gastrointestinal bleeding.  YOUR PLAN:  -ROUTINE GYNECOLOGICAL EXAMINATION WITH PAP SMEAR: A Pap smear is a routine test to screen for cervical cancer. Your previous Pap smears in 2021, 2018, 2017, and 2016 were normal. We performed the Pap smear today and will update you with the results when they are available.  -LEFT SHOULDER PAIN LIKELY DUE TO TENDINITIS: Tendinitis or bursitis involves inflammation of the tendons or bursa in the shoulder, causing pain and restricted movement. You have been experiencing this pain for about a month. We prescribed prednisone to help with inflammation and advised you to continue using ice packs for 15 minutes before bed. We also referred you to sports medicine for further evaluation and recommendations.   -ESSENTIAL HYPERTENSION: Essential hypertension is high blood pressure with no identifiable cause. Your blood pressure is well-controlled with metoprolol . You should continue taking metoprolol  and use hydrochlorothiazide  as needed for leg swelling.  -HISTORY OF GASTROINTESTINAL BLEEDING: You have a history of gastrointestinal bleeding, which led to the discontinuation of lisinopril  and hydrochlorothiazide . This is important to keep in mind when managing your medications.  INSTRUCTIONS:  We will update you with the results of your Pap smear when they are available. If your shoulder pain persists, please follow up with the sports medicine specialist for further evaluation and possible ultrasound. Continue taking your medications as prescribed and use hydrochlorothiazide  as needed for leg swelling.

## 2024-01-28 LAB — PAP IG W/ RFLX HPV ASCU

## 2024-01-28 NOTE — Progress Notes (Signed)
 No show/late cancel

## 2024-02-01 ENCOUNTER — Ambulatory Visit: Payer: Self-pay | Admitting: Infectious Diseases

## 2024-02-02 ENCOUNTER — Encounter: Admitting: Sports Medicine

## 2024-02-07 NOTE — Progress Notes (Signed)
 This encounter was created in error - please disregard.

## 2024-02-09 NOTE — Progress Notes (Deleted)
    [facility] [address] [place] [address] Phone: [phone]   Assessment and Plan:     ***    Pertinent previous records reviewed include ***   Follow Up: ***     Subjective:   I, Vicki Brown, am serving as a neurosurgeon for Doctor Morene Mace  Chief Complaint: left shoulder pain   HPI:   02/10/2024 Patient is a 63 year old female with left shoulder pain. Patient states   Relevant Historical Information: ***  Additional pertinent review of systems negative.   Current Outpatient Medications:    atorvastatin  (LIPITOR) 10 MG tablet, TAKE 4 TABLETS EVERY DAY, Disp: 30 tablet, Rfl: 11   bictegravir-emtricitabine -tenofovir  AF (BIKTARVY ) 50-200-25 MG TABS tablet, Take 1 tablet by mouth daily., Disp: 30 tablet, Rfl: 5   bismuth  subsalicylate (PEPTO BISMOL) 262 MG/15ML suspension, Take 30 mLs by mouth every 6 (six) hours as needed. (Patient not taking: Reported on 01/25/2024), Disp: , Rfl:    hydrochlorothiazide  (MICROZIDE ) 12.5 MG capsule, Take 1 capsule (12.5 mg total) by mouth daily., Disp: 90 capsule, Rfl: 3   Iron , Ferrous Sulfate , 325 (65 Fe) MG TABS, Take 325 mg by mouth in the morning and at bedtime., Disp: 60 tablet, Rfl: 2   [Paused] lisinopril -hydrochlorothiazide  (ZESTORETIC ) 10-12.5 MG tablet, Take 1 tablet by mouth daily., Disp: 90 tablet, Rfl: 3   metoprolol  succinate (TOPROL -XL) 25 MG 24 hr tablet, TAKE 1 TABLET (25 MG TOTAL) BY MOUTH DAILY., Disp: 90 tablet, Rfl: 1   pantoprazole  (PROTONIX ) 40 MG tablet, Take 1 tablet (40 mg total) by mouth 2 (two) times daily., Disp: 140 tablet, Rfl: 0   traZODone  (DESYREL ) 50 MG tablet, Take 0.5 tablets (25 mg total) by mouth at bedtime., Disp: 30 tablet, Rfl: 3   Vitamin D , Ergocalciferol , 50000 units CAPS, Take 1 capsule by mouth once a week., Disp: 8 capsule, Rfl: 0   XARELTO  20 MG TABS tablet, TAKE 1 TABLET BY MOUTH DAILY WITH SUPPER, Disp: 30 tablet, Rfl: 5    Objective:     There were no vitals filed for this visit.    There is no height or weight on file to calculate BMI.    Physical Exam:    ***   Electronically signed by:  Odis Mace [facility] 7:46 AM 02/09/24

## 2024-02-10 ENCOUNTER — Other Ambulatory Visit: Payer: Self-pay

## 2024-02-10 ENCOUNTER — Ambulatory Visit: Admitting: Sports Medicine

## 2024-02-10 MED ORDER — ATORVASTATIN CALCIUM 10 MG PO TABS: ORAL_TABLET | ORAL | 6 refills | Status: AC
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
## 2024-02-15 ENCOUNTER — Ambulatory Visit: Admitting: Sports Medicine

## 2024-02-15 ENCOUNTER — Ambulatory Visit

## 2024-02-15 VITALS — HR 79 | Ht 66.0 in | Wt 196.0 lb

## 2024-02-15 DIAGNOSIS — M7552 Bursitis of left shoulder: Secondary | ICD-10-CM

## 2024-02-15 DIAGNOSIS — G8929 Other chronic pain: Secondary | ICD-10-CM

## 2024-02-15 DIAGNOSIS — M25512 Pain in left shoulder: Secondary | ICD-10-CM

## 2024-02-15 NOTE — Patient Instructions (Signed)
 Shoulder HEP   4 week follow up

## 2024-02-21 ENCOUNTER — Ambulatory Visit: Payer: Self-pay | Admitting: Sports Medicine

## 2024-02-21 ENCOUNTER — Other Ambulatory Visit: Payer: Self-pay | Admitting: Infectious Diseases
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
## 2024-03-14 ENCOUNTER — Ambulatory Visit: Admitting: Sports Medicine

## 2024-03-14 VITALS — HR 79 | Ht 66.0 in | Wt 197.0 lb

## 2024-03-14 DIAGNOSIS — M79602 Pain in left arm: Secondary | ICD-10-CM

## 2024-03-14 DIAGNOSIS — M25512 Pain in left shoulder: Secondary | ICD-10-CM

## 2024-03-14 DIAGNOSIS — G8929 Other chronic pain: Secondary | ICD-10-CM | POA: Diagnosis not present

## 2024-03-14 DIAGNOSIS — M7552 Bursitis of left shoulder: Secondary | ICD-10-CM

## 2024-03-14 NOTE — Patient Instructions (Signed)
 Continue HEP   May use voltaren  gel over areas of pain   Call if you would like a PT referral   As needed follow up

## 2024-03-30 ENCOUNTER — Other Ambulatory Visit: Payer: Self-pay | Admitting: Infectious Diseases

## 2024-03-31 NOTE — Telephone Encounter (Signed)
 Meloxicam  discontinued on 01/25/24 due to patient's history of GI bleed. She was referred to sports medicine for further management of shoulder pain.   Alston Berrie, BSN, RN

## 2024-07-28 ENCOUNTER — Ambulatory Visit: Admitting: Family
